# Patient Record
Sex: Male | Born: 1970 | Race: Black or African American | Hispanic: No | Marital: Single | Smoking: Current every day smoker
Health system: Southern US, Community
[De-identification: ages and names within clinical notes are randomized; demographics above are authoritative.]

## PROBLEM LIST (undated history)

## (undated) DIAGNOSIS — E7800 Pure hypercholesterolemia, unspecified: Secondary | ICD-10-CM

## (undated) DIAGNOSIS — Z872 Personal history of diseases of the skin and subcutaneous tissue: Secondary | ICD-10-CM

## (undated) DIAGNOSIS — K219 Gastro-esophageal reflux disease without esophagitis: Secondary | ICD-10-CM

## (undated) DIAGNOSIS — Z720 Tobacco use: Secondary | ICD-10-CM

## (undated) DIAGNOSIS — L0390 Cellulitis, unspecified: Secondary | ICD-10-CM

## (undated) DIAGNOSIS — M109 Gout, unspecified: Secondary | ICD-10-CM

## (undated) DIAGNOSIS — J189 Pneumonia, unspecified organism: Secondary | ICD-10-CM

## (undated) DIAGNOSIS — I10 Essential (primary) hypertension: Secondary | ICD-10-CM

## (undated) DIAGNOSIS — E119 Type 2 diabetes mellitus without complications: Secondary | ICD-10-CM

## (undated) DIAGNOSIS — R3129 Other microscopic hematuria: Secondary | ICD-10-CM

## (undated) DIAGNOSIS — K5792 Diverticulitis of intestine, part unspecified, without perforation or abscess without bleeding: Secondary | ICD-10-CM

## (undated) DIAGNOSIS — N35919 Unspecified urethral stricture, male, unspecified site: Secondary | ICD-10-CM

## (undated) DIAGNOSIS — E876 Hypokalemia: Secondary | ICD-10-CM

## (undated) HISTORY — PX: WRIST ARTHROCENTESIS: SUR48

## (undated) HISTORY — PX: ARM WOUND REPAIR / CLOSURE: SUR1141

---

## 2001-10-04 ENCOUNTER — Encounter: Payer: Self-pay | Admitting: *Deleted

## 2001-10-04 ENCOUNTER — Emergency Department (HOSPITAL_COMMUNITY): Admission: EM | Admit: 2001-10-04 | Discharge: 2001-10-04 | Payer: Self-pay | Admitting: *Deleted

## 2002-02-12 ENCOUNTER — Encounter: Payer: Self-pay | Admitting: Urology

## 2002-02-12 ENCOUNTER — Ambulatory Visit (HOSPITAL_COMMUNITY): Admission: RE | Admit: 2002-02-12 | Discharge: 2002-02-12 | Payer: Self-pay | Admitting: Urology

## 2002-08-01 ENCOUNTER — Emergency Department (HOSPITAL_COMMUNITY): Admission: EM | Admit: 2002-08-01 | Discharge: 2002-08-01 | Payer: Self-pay | Admitting: Emergency Medicine

## 2002-10-25 ENCOUNTER — Emergency Department (HOSPITAL_COMMUNITY): Admission: EM | Admit: 2002-10-25 | Discharge: 2002-10-25 | Payer: Self-pay | Admitting: *Deleted

## 2005-01-13 ENCOUNTER — Emergency Department (HOSPITAL_COMMUNITY): Admission: EM | Admit: 2005-01-13 | Discharge: 2005-01-13 | Payer: Self-pay | Admitting: Emergency Medicine

## 2005-05-07 ENCOUNTER — Emergency Department (HOSPITAL_COMMUNITY): Admission: EM | Admit: 2005-05-07 | Discharge: 2005-05-07 | Payer: Self-pay | Admitting: Emergency Medicine

## 2008-07-25 ENCOUNTER — Emergency Department (HOSPITAL_COMMUNITY): Admission: EM | Admit: 2008-07-25 | Discharge: 2008-07-25 | Payer: Self-pay | Admitting: Emergency Medicine

## 2009-01-15 ENCOUNTER — Emergency Department (HOSPITAL_COMMUNITY): Admission: EM | Admit: 2009-01-15 | Discharge: 2009-01-15 | Payer: Self-pay | Admitting: Emergency Medicine

## 2009-01-17 ENCOUNTER — Emergency Department (HOSPITAL_COMMUNITY): Admission: EM | Admit: 2009-01-17 | Discharge: 2009-01-17 | Payer: Self-pay | Admitting: Emergency Medicine

## 2009-01-22 ENCOUNTER — Emergency Department (HOSPITAL_COMMUNITY): Admission: EM | Admit: 2009-01-22 | Discharge: 2009-01-22 | Payer: Self-pay | Admitting: Emergency Medicine

## 2009-12-11 ENCOUNTER — Emergency Department (HOSPITAL_COMMUNITY): Admission: EM | Admit: 2009-12-11 | Discharge: 2009-12-11 | Payer: Self-pay | Admitting: Emergency Medicine

## 2009-12-18 ENCOUNTER — Emergency Department (HOSPITAL_COMMUNITY): Admission: EM | Admit: 2009-12-18 | Discharge: 2009-12-18 | Payer: Self-pay | Admitting: Emergency Medicine

## 2010-04-02 ENCOUNTER — Ambulatory Visit: Payer: Self-pay | Admitting: Cardiology

## 2010-04-02 ENCOUNTER — Inpatient Hospital Stay (HOSPITAL_COMMUNITY): Admission: EM | Admit: 2010-04-02 | Discharge: 2010-04-06 | Payer: Self-pay | Admitting: Emergency Medicine

## 2010-10-14 ENCOUNTER — Emergency Department (HOSPITAL_COMMUNITY): Admission: EM | Admit: 2010-10-14 | Discharge: 2010-10-14 | Payer: Self-pay | Admitting: Emergency Medicine

## 2010-10-19 ENCOUNTER — Inpatient Hospital Stay (HOSPITAL_COMMUNITY): Admission: EM | Admit: 2010-10-19 | Discharge: 2010-10-22 | Payer: Self-pay | Admitting: Emergency Medicine

## 2011-02-09 LAB — RAPID URINE DRUG SCREEN, HOSP PERFORMED
Amphetamines: NOT DETECTED
Barbiturates: NOT DETECTED
Benzodiazepines: POSITIVE — AB
Cocaine: NOT DETECTED
Opiates: POSITIVE — AB
Tetrahydrocannabinol: NOT DETECTED

## 2011-02-09 LAB — POCT I-STAT, CHEM 8
BUN: 23 mg/dL (ref 6–23)
Calcium, Ion: 1.1 mmol/L — ABNORMAL LOW (ref 1.12–1.32)
Chloride: 103 mEq/L (ref 96–112)
Creatinine, Ser: 1.3 mg/dL (ref 0.4–1.5)
Glucose, Bld: 149 mg/dL — ABNORMAL HIGH (ref 70–99)
HCT: 41 % (ref 39.0–52.0)
Hemoglobin: 13.9 g/dL (ref 13.0–17.0)
Potassium: 2.8 mEq/L — ABNORMAL LOW (ref 3.5–5.1)
Sodium: 139 meq/L (ref 135–145)
TCO2: 24 mmol/L (ref 0–100)

## 2011-02-09 LAB — URINALYSIS, ROUTINE W REFLEX MICROSCOPIC
Bilirubin Urine: NEGATIVE
Bilirubin Urine: NEGATIVE
Glucose, UA: 250 mg/dL — AB
Glucose, UA: NEGATIVE mg/dL
Ketones, ur: NEGATIVE mg/dL
Ketones, ur: NEGATIVE mg/dL
Leukocytes, UA: NEGATIVE
Leukocytes, UA: NEGATIVE
Nitrite: NEGATIVE
Nitrite: NEGATIVE
Protein, ur: NEGATIVE mg/dL
Protein, ur: NEGATIVE mg/dL
Specific Gravity, Urine: 1.02 (ref 1.005–1.030)
Specific Gravity, Urine: 1.025 (ref 1.005–1.030)
Urobilinogen, UA: 1 mg/dL (ref 0.0–1.0)
Urobilinogen, UA: >8 mg/dL — ABNORMAL HIGH (ref 0.0–1.0)
pH: 5.5 (ref 5.0–8.0)
pH: 5.5 (ref 5.0–8.0)

## 2011-02-09 LAB — BASIC METABOLIC PANEL
BUN: 22 mg/dL (ref 6–23)
BUN: 6 mg/dL (ref 6–23)
CO2: 23 meq/L (ref 19–32)
CO2: 24 meq/L (ref 19–32)
Calcium: 8.7 mg/dL (ref 8.4–10.5)
Calcium: 9 mg/dL (ref 8.4–10.5)
Chloride: 102 mEq/L (ref 96–112)
Chloride: 110 mEq/L (ref 96–112)
Creatinine, Ser: 1.05 mg/dL (ref 0.4–1.5)
Creatinine, Ser: 1.34 mg/dL (ref 0.4–1.5)
GFR calc Af Amer: >60 mL/min (ref >60)
GFR calc Af Amer: >60 mL/min (ref >60)
GFR calc non Af Amer: 60 mL/min — ABNORMAL LOW (ref >60)
GFR calc non Af Amer: >60 mL/min (ref >60)
Glucose, Bld: 127 mg/dL — ABNORMAL HIGH (ref 70–99)
Glucose, Bld: 148 mg/dL — ABNORMAL HIGH (ref 70–99)
Potassium: 2.7 meq/L — CL (ref 3.5–5.1)
Potassium: 3.8 mEq/L (ref 3.5–5.1)
Sodium: 138 meq/L (ref 135–145)
Sodium: 139 mEq/L (ref 135–145)

## 2011-02-09 LAB — URINE MICROSCOPIC-ADD ON

## 2011-02-09 LAB — GRAM STAIN

## 2011-02-09 LAB — DIFFERENTIAL
Basophils Absolute: 0 10*3/uL (ref 0.0–0.1)
Basophils Absolute: 0 10*3/uL (ref 0.0–0.1)
Basophils Absolute: 0 10*3/uL (ref 0.0–0.1)
Basophils Relative: 0 % (ref 0–1)
Basophils Relative: 0 % (ref 0–1)
Basophils Relative: 0 % (ref 0–1)
Eosinophils Absolute: 0.1 10*3/uL (ref 0.0–0.7)
Eosinophils Absolute: 0.1 10*3/uL (ref 0.0–0.7)
Eosinophils Absolute: 0.3 10*3/uL (ref 0.0–0.7)
Eosinophils Relative: 0 % (ref 0–5)
Eosinophils Relative: 1 % (ref 0–5)
Eosinophils Relative: 3 % (ref 0–5)
Lymphocytes Relative: 12 % (ref 12–46)
Lymphocytes Relative: 18 % (ref 12–46)
Lymphocytes Relative: 6 % — ABNORMAL LOW (ref 12–46)
Lymphs Abs: 1.3 10*3/uL (ref 0.7–4.0)
Lymphs Abs: 2.1 10*3/uL (ref 0.7–4.0)
Lymphs Abs: 2.3 10*3/uL (ref 0.7–4.0)
Monocytes Absolute: 0.4 10*3/uL (ref 0.1–1.0)
Monocytes Absolute: 0.5 10*3/uL (ref 0.1–1.0)
Monocytes Absolute: 0.9 10*3/uL (ref 0.1–1.0)
Monocytes Relative: 2 % — ABNORMAL LOW (ref 3–12)
Monocytes Relative: 4 % (ref 3–12)
Monocytes Relative: 5 % (ref 3–12)
Neutro Abs: 15.8 10*3/uL — ABNORMAL HIGH (ref 1.7–7.7)
Neutro Abs: 19.4 10*3/uL — ABNORMAL HIGH (ref 1.7–7.7)
Neutro Abs: 9 10*3/uL — ABNORMAL HIGH (ref 1.7–7.7)
Neutrophils Relative %: 75 % (ref 43–77)
Neutrophils Relative %: 82 % — ABNORMAL HIGH (ref 43–77)
Neutrophils Relative %: 92 % — ABNORMAL HIGH (ref 43–77)

## 2011-02-09 LAB — COMPREHENSIVE METABOLIC PANEL
ALT: 17 U/L (ref 0–53)
AST: 17 U/L (ref 0–37)
Albumin: 3.1 g/dL — ABNORMAL LOW (ref 3.5–5.2)
Alkaline Phosphatase: 46 U/L (ref 39–117)
BUN: 13 mg/dL (ref 6–23)
CO2: 23 meq/L (ref 19–32)
Calcium: 8.2 mg/dL — ABNORMAL LOW (ref 8.4–10.5)
Chloride: 105 meq/L (ref 96–112)
Creatinine, Ser: 1.11 mg/dL (ref 0.4–1.5)
GFR calc Af Amer: >60 mL/min (ref >60)
GFR calc non Af Amer: >60 mL/min (ref >60)
Glucose, Bld: 148 mg/dL — ABNORMAL HIGH (ref 70–99)
Potassium: 3.4 mEq/L — ABNORMAL LOW (ref 3.5–5.1)
Sodium: 137 meq/L (ref 135–145)
Total Bilirubin: 1.1 mg/dL (ref 0.3–1.2)
Total Protein: 5.6 g/dL — ABNORMAL LOW (ref 6.0–8.3)

## 2011-02-09 LAB — CBC
HCT: 33.5 % — ABNORMAL LOW (ref 39.0–52.0)
HCT: 34.4 % — ABNORMAL LOW (ref 39.0–52.0)
HCT: 38.6 % — ABNORMAL LOW (ref 39.0–52.0)
Hemoglobin: 11.6 g/dL — ABNORMAL LOW (ref 13.0–17.0)
Hemoglobin: 11.7 g/dL — ABNORMAL LOW (ref 13.0–17.0)
Hemoglobin: 12.9 g/dL — ABNORMAL LOW (ref 13.0–17.0)
MCH: 31.5 pg (ref 26.0–34.0)
MCH: 32 pg (ref 26.0–34.0)
MCH: 32.2 pg (ref 26.0–34.0)
MCHC: 33.5 g/dL (ref 30.0–36.0)
MCHC: 34 g/dL (ref 30.0–36.0)
MCHC: 34.6 g/dL (ref 30.0–36.0)
MCV: 92.3 fL (ref 78.0–100.0)
MCV: 92.7 fL (ref 78.0–100.0)
MCV: 96.1 fL (ref 78.0–100.0)
Platelets: 194 10*3/uL (ref 150–400)
Platelets: 203 10*3/uL (ref 150–400)
Platelets: 226 10*3/uL (ref 150–400)
RBC: 3.63 MIL/uL — ABNORMAL LOW (ref 4.22–5.81)
RBC: 3.71 MIL/uL — ABNORMAL LOW (ref 4.22–5.81)
RBC: 4.02 MIL/uL — ABNORMAL LOW (ref 4.22–5.81)
RDW: 13.9 % (ref 11.5–15.5)
RDW: 13.9 % (ref 11.5–15.5)
RDW: 14.5 % (ref 11.5–15.5)
WBC: 11.9 10*3/uL — ABNORMAL HIGH (ref 4.0–10.5)
WBC: 19.1 10*3/uL — ABNORMAL HIGH (ref 4.0–10.5)
WBC: 21.2 10*3/uL — ABNORMAL HIGH (ref 4.0–10.5)

## 2011-02-09 LAB — CULTURE, BLOOD (ROUTINE X 2)
Culture  Setup Time: 201111222100
Culture: NO GROWTH
Culture: NO GROWTH
Culture: NO GROWTH

## 2011-02-09 LAB — CULTURE, RESPIRATORY W GRAM STAIN: Culture: NORMAL

## 2011-02-09 LAB — CARDIAC PANEL(CRET KIN+CKTOT+MB+TROPI)
CK, MB: 1.8 ng/mL (ref 0.3–4.0)
CK, MB: 2.4 ng/mL (ref 0.3–4.0)
CK, MB: 2.7 ng/mL (ref 0.3–4.0)
Relative Index: 0.6 (ref 0.0–2.5)
Relative Index: 0.6 (ref 0.0–2.5)
Relative Index: 0.7 (ref 0.0–2.5)
Total CK: 278 U/L — ABNORMAL HIGH (ref 7–232)
Total CK: 382 U/L — ABNORMAL HIGH (ref 7–232)
Total CK: 412 U/L — ABNORMAL HIGH (ref 7–232)
Troponin I: 0.01 ng/mL (ref 0.00–0.06)
Troponin I: 0.04 ng/mL (ref 0.00–0.06)
Troponin I: <0.01 ng/mL (ref 0.00–0.06)

## 2011-02-09 LAB — PHOSPHORUS
Phosphorus: 2.2 mg/dL — ABNORMAL LOW (ref 2.3–4.6)
Phosphorus: 3 mg/dL (ref 2.3–4.6)

## 2011-02-09 LAB — MAGNESIUM
Magnesium: 1.8 mg/dL (ref 1.5–2.5)
Magnesium: 1.9 mg/dL (ref 1.5–2.5)

## 2011-02-09 LAB — URINE CULTURE
Colony Count: NO GROWTH
Culture  Setup Time: 201111220320
Culture: NO GROWTH
Special Requests: NEGATIVE

## 2011-02-09 LAB — VANCOMYCIN, TROUGH
Vancomycin Tr: 11.2 ug/mL (ref 10.0–20.0)
Vancomycin Tr: 19.1 ug/mL (ref 10.0–20.0)

## 2011-02-09 LAB — HEMOGLOBIN A1C
Hgb A1c MFr Bld: 5.5 % (ref <5.7)
Mean Plasma Glucose: 111 mg/dL (ref <117)

## 2011-02-09 LAB — PROTIME-INR
INR: 1.12 (ref 0.00–1.49)
Prothrombin Time: 14.6 seconds (ref 11.6–15.2)

## 2011-02-09 LAB — APTT: aPTT: 31 s (ref 24–37)

## 2011-02-09 LAB — T4, FREE: Free T4: 1.12 ng/dL (ref 0.80–1.80)

## 2011-02-09 LAB — LACTIC ACID, PLASMA: Lactic Acid, Venous: 1.4 mmol/L (ref 0.5–2.2)

## 2011-02-09 LAB — TSH: TSH: 0.999 u[IU]/mL (ref 0.350–4.500)

## 2011-02-09 LAB — PROCALCITONIN: Procalcitonin: 2.99 ng/mL

## 2011-02-14 LAB — DIFFERENTIAL
Basophils Absolute: 0.1 10*3/uL (ref 0.0–0.1)
Basophils Relative: 1 % (ref 0–1)
Eosinophils Absolute: 0.2 10*3/uL (ref 0.0–0.7)
Eosinophils Relative: 2 % (ref 0–5)
Lymphocytes Relative: 17 % (ref 12–46)
Lymphs Abs: 1.4 10*3/uL (ref 0.7–4.0)
Monocytes Absolute: 0.2 10*3/uL (ref 0.1–1.0)
Monocytes Relative: 3 % (ref 3–12)
Neutro Abs: 6.6 10*3/uL (ref 1.7–7.7)
Neutrophils Relative %: 78 % — ABNORMAL HIGH (ref 43–77)

## 2011-02-14 LAB — URINALYSIS, ROUTINE W REFLEX MICROSCOPIC
Bilirubin Urine: NEGATIVE
Glucose, UA: NEGATIVE mg/dL
Ketones, ur: NEGATIVE mg/dL
Nitrite: NEGATIVE
Protein, ur: NEGATIVE mg/dL
Specific Gravity, Urine: 1.02 (ref 1.005–1.030)
Urobilinogen, UA: 0.2 mg/dL (ref 0.0–1.0)
pH: 5.5 (ref 5.0–8.0)

## 2011-02-14 LAB — BASIC METABOLIC PANEL
BUN: 8 mg/dL (ref 6–23)
CO2: 25 mEq/L (ref 19–32)
Calcium: 9.6 mg/dL (ref 8.4–10.5)
Chloride: 105 mEq/L (ref 96–112)
Creatinine, Ser: 1.29 mg/dL (ref 0.4–1.5)
GFR calc Af Amer: >60 mL/min (ref >60)
GFR calc non Af Amer: >60 mL/min (ref >60)
Glucose, Bld: 104 mg/dL — ABNORMAL HIGH (ref 70–99)
Potassium: 4 meq/L (ref 3.5–5.1)
Sodium: 137 mEq/L (ref 135–145)

## 2011-02-14 LAB — URINE MICROSCOPIC-ADD ON

## 2011-02-14 LAB — CBC
HCT: 42.5 % (ref 39.0–52.0)
Hemoglobin: 14.1 g/dL (ref 13.0–17.0)
MCHC: 33.3 g/dL (ref 30.0–36.0)
MCV: 94.6 fL (ref 78.0–100.0)
Platelets: 230 10*3/uL (ref 150–400)
RBC: 4.49 MIL/uL (ref 4.22–5.81)
RDW: 13.8 % (ref 11.5–15.5)
WBC: 8.4 10*3/uL (ref 4.0–10.5)

## 2011-02-16 LAB — CULTURE, BLOOD (ROUTINE X 2)
Culture: NO GROWTH
Culture: NO GROWTH
Report Status: 5102011
Report Status: 5102011

## 2011-02-16 LAB — DIFFERENTIAL
Basophils Absolute: 0 10*3/uL (ref 0.0–0.1)
Basophils Absolute: 0 10*3/uL (ref 0.0–0.1)
Basophils Absolute: 0 10*3/uL (ref 0.0–0.1)
Basophils Relative: 0 % (ref 0–1)
Basophils Relative: 0 % (ref 0–1)
Basophils Relative: 0 % (ref 0–1)
Eosinophils Absolute: 0 10*3/uL (ref 0.0–0.7)
Eosinophils Absolute: 0.1 10*3/uL (ref 0.0–0.7)
Eosinophils Absolute: 0.4 10*3/uL (ref 0.0–0.7)
Eosinophils Relative: 0 % (ref 0–5)
Eosinophils Relative: 1 % (ref 0–5)
Eosinophils Relative: 6 % — ABNORMAL HIGH (ref 0–5)
Lymphocytes Relative: 16 % (ref 12–46)
Lymphocytes Relative: 17 % (ref 12–46)
Lymphocytes Relative: 6 % — ABNORMAL LOW (ref 12–46)
Lymphs Abs: 1 10*3/uL (ref 0.7–4.0)
Lymphs Abs: 1.1 10*3/uL (ref 0.7–4.0)
Lymphs Abs: 1.2 10*3/uL (ref 0.7–4.0)
Monocytes Absolute: 0.3 10*3/uL (ref 0.1–1.0)
Monocytes Absolute: 0.3 10*3/uL (ref 0.1–1.0)
Monocytes Absolute: 0.4 10*3/uL (ref 0.1–1.0)
Monocytes Relative: 2 % — ABNORMAL LOW (ref 3–12)
Monocytes Relative: 5 % (ref 3–12)
Monocytes Relative: 7 % (ref 3–12)
Neutro Abs: 17.8 10*3/uL — ABNORMAL HIGH (ref 1.7–7.7)
Neutro Abs: 4.5 10*3/uL (ref 1.7–7.7)
Neutro Abs: 4.7 10*3/uL (ref 1.7–7.7)
Neutrophils Relative %: 73 % (ref 43–77)
Neutrophils Relative %: 75 % (ref 43–77)
Neutrophils Relative %: 92 % — ABNORMAL HIGH (ref 43–77)

## 2011-02-16 LAB — CBC
HCT: 32.4 % — ABNORMAL LOW (ref 39.0–52.0)
HCT: 33.8 % — ABNORMAL LOW (ref 39.0–52.0)
HCT: 39.8 % (ref 39.0–52.0)
Hemoglobin: 11.5 g/dL — ABNORMAL LOW (ref 13.0–17.0)
Hemoglobin: 11.9 g/dL — ABNORMAL LOW (ref 13.0–17.0)
Hemoglobin: 13.8 g/dL (ref 13.0–17.0)
MCHC: 34.8 g/dL (ref 30.0–36.0)
MCHC: 35.2 g/dL (ref 30.0–36.0)
MCHC: 35.5 g/dL (ref 30.0–36.0)
MCV: 92.8 fL (ref 78.0–100.0)
MCV: 93.1 fL (ref 78.0–100.0)
MCV: 93.1 fL (ref 78.0–100.0)
Platelets: 173 10*3/uL (ref 150–400)
Platelets: 179 10*3/uL (ref 150–400)
Platelets: 220 10*3/uL (ref 150–400)
RBC: 3.49 MIL/uL — ABNORMAL LOW (ref 4.22–5.81)
RBC: 3.63 MIL/uL — ABNORMAL LOW (ref 4.22–5.81)
RBC: 4.27 MIL/uL (ref 4.22–5.81)
RDW: 13.7 % (ref 11.5–15.5)
RDW: 13.8 % (ref 11.5–15.5)
RDW: 14.1 % (ref 11.5–15.5)
WBC: 19.4 10*3/uL — ABNORMAL HIGH (ref 4.0–10.5)
WBC: 6.1 10*3/uL (ref 4.0–10.5)
WBC: 6.3 10*3/uL (ref 4.0–10.5)

## 2011-02-16 LAB — URINALYSIS, ROUTINE W REFLEX MICROSCOPIC
Bilirubin Urine: NEGATIVE
Glucose, UA: NEGATIVE mg/dL
Ketones, ur: NEGATIVE mg/dL
Leukocytes, UA: NEGATIVE
Nitrite: NEGATIVE
Protein, ur: NEGATIVE mg/dL
Specific Gravity, Urine: 1.01 (ref 1.005–1.030)
Urobilinogen, UA: 0.2 mg/dL (ref 0.0–1.0)
pH: 7 (ref 5.0–8.0)

## 2011-02-16 LAB — LIPID PANEL
Cholesterol: 187 mg/dL (ref 0–200)
HDL: 35 mg/dL — ABNORMAL LOW (ref >39)
LDL Cholesterol: 136 mg/dL — ABNORMAL HIGH (ref 0–99)
Total CHOL/HDL Ratio: 5.3 RATIO
Triglycerides: 82 mg/dL (ref <150)
VLDL: 16 mg/dL (ref 0–40)

## 2011-02-16 LAB — BASIC METABOLIC PANEL
BUN: 10 mg/dL (ref 6–23)
BUN: 11 mg/dL (ref 6–23)
BUN: 8 mg/dL (ref 6–23)
BUN: 9 mg/dL (ref 6–23)
CO2: 23 mEq/L (ref 19–32)
CO2: 24 mEq/L (ref 19–32)
CO2: 25 mEq/L (ref 19–32)
CO2: 26 mEq/L (ref 19–32)
Calcium: 8.3 mg/dL — ABNORMAL LOW (ref 8.4–10.5)
Calcium: 8.3 mg/dL — ABNORMAL LOW (ref 8.4–10.5)
Calcium: 9 mg/dL (ref 8.4–10.5)
Calcium: 9.5 mg/dL (ref 8.4–10.5)
Chloride: 106 mEq/L (ref 96–112)
Chloride: 106 meq/L (ref 96–112)
Chloride: 107 mEq/L (ref 96–112)
Chloride: 98 mEq/L (ref 96–112)
Creatinine, Ser: 0.85 mg/dL (ref 0.4–1.5)
Creatinine, Ser: 1.11 mg/dL (ref 0.4–1.5)
Creatinine, Ser: 1.2 mg/dL (ref 0.4–1.5)
Creatinine, Ser: 1.24 mg/dL (ref 0.4–1.5)
GFR calc Af Amer: >60 mL/min (ref >60)
GFR calc Af Amer: >60 mL/min (ref >60)
GFR calc Af Amer: >60 mL/min (ref >60)
GFR calc Af Amer: >60 mL/min (ref >60)
GFR calc non Af Amer: >60 mL/min (ref >60)
GFR calc non Af Amer: >60 mL/min (ref >60)
GFR calc non Af Amer: >60 mL/min (ref >60)
GFR calc non Af Amer: >60 mL/min (ref >60)
Glucose, Bld: 102 mg/dL — ABNORMAL HIGH (ref 70–99)
Glucose, Bld: 102 mg/dL — ABNORMAL HIGH (ref 70–99)
Glucose, Bld: 104 mg/dL — ABNORMAL HIGH (ref 70–99)
Glucose, Bld: 122 mg/dL — ABNORMAL HIGH (ref 70–99)
Potassium: 3.4 meq/L — ABNORMAL LOW (ref 3.5–5.1)
Potassium: 3.5 mEq/L (ref 3.5–5.1)
Potassium: 3.8 mEq/L (ref 3.5–5.1)
Potassium: 3.8 meq/L (ref 3.5–5.1)
Sodium: 134 mEq/L — ABNORMAL LOW (ref 135–145)
Sodium: 135 meq/L (ref 135–145)
Sodium: 137 mEq/L (ref 135–145)
Sodium: 139 mEq/L (ref 135–145)

## 2011-02-16 LAB — CARDIAC PANEL(CRET KIN+CKTOT+MB+TROPI)
CK, MB: 0.8 ng/mL (ref 0.3–4.0)
CK, MB: 1.7 ng/mL (ref 0.3–4.0)
Relative Index: 0.3 (ref 0.0–2.5)
Relative Index: 0.7 (ref 0.0–2.5)
Total CK: 229 U/L (ref 7–232)
Total CK: 231 U/L (ref 7–232)
Troponin I: 0.02 ng/mL (ref 0.00–0.06)
Troponin I: 0.03 ng/mL (ref 0.00–0.06)

## 2011-02-16 LAB — URINE MICROSCOPIC-ADD ON

## 2011-02-16 LAB — POCT CARDIAC MARKERS
CKMB, poc: <1 ng/mL — ABNORMAL LOW (ref 1.0–8.0)
CKMB, poc: <1 ng/mL — ABNORMAL LOW (ref 1.0–8.0)
Myoglobin, poc: 54.3 ng/mL (ref 12–200)
Myoglobin, poc: 96 ng/mL (ref 12–200)
Troponin i, poc: <0.05 ng/mL (ref 0.00–0.09)
Troponin i, poc: <0.05 ng/mL (ref 0.00–0.09)

## 2011-02-16 LAB — TSH: TSH: 0.29 u[IU]/mL — ABNORMAL LOW (ref 0.350–4.500)

## 2011-02-16 LAB — RAPID URINE DRUG SCREEN, HOSP PERFORMED
Amphetamines: NOT DETECTED
Barbiturates: NOT DETECTED
Benzodiazepines: POSITIVE — AB
Cocaine: NOT DETECTED
Opiates: NOT DETECTED
Tetrahydrocannabinol: NOT DETECTED

## 2011-02-16 LAB — BRAIN NATRIURETIC PEPTIDE: Pro B Natriuretic peptide (BNP): <30 pg/mL (ref 0.0–100.0)

## 2011-02-16 LAB — GLUCOSE, CAPILLARY: Glucose-Capillary: 93 mg/dL (ref 70–99)

## 2011-02-16 LAB — PROTIME-INR
INR: 1.18 (ref 0.00–1.49)
Prothrombin Time: 15.2 seconds (ref 11.6–15.2)

## 2011-02-16 LAB — VANCOMYCIN, TROUGH: Vancomycin Tr: 12 ug/mL (ref 10.0–20.0)

## 2011-02-16 LAB — T4, FREE: Free T4: 0.72 ng/dL — ABNORMAL LOW (ref 0.80–1.80)

## 2011-03-16 LAB — POCT CARDIAC MARKERS
CKMB, poc: 1.4 ng/mL (ref 1.0–8.0)
CKMB, poc: 1.9 ng/mL (ref 1.0–8.0)
Myoglobin, poc: 53.5 ng/mL (ref 12–200)
Myoglobin, poc: 80.6 ng/mL (ref 12–200)
Troponin i, poc: <0.05 ng/mL (ref 0.00–0.09)
Troponin i, poc: <0.05 ng/mL (ref 0.00–0.09)

## 2011-03-16 LAB — POCT I-STAT, CHEM 8
BUN: 16 mg/dL (ref 6–23)
Calcium, Ion: 1.17 mmol/L (ref 1.12–1.32)
Chloride: 103 mEq/L (ref 96–112)
Creatinine, Ser: 1.1 mg/dL (ref 0.4–1.5)
Glucose, Bld: 90 mg/dL (ref 70–99)
HCT: 46 % (ref 39.0–52.0)
Hemoglobin: 15.6 g/dL (ref 13.0–17.0)
Potassium: 3.8 mEq/L (ref 3.5–5.1)
Sodium: 139 mEq/L (ref 135–145)
TCO2: 27 mmol/L (ref 0–100)

## 2011-03-16 LAB — BASIC METABOLIC PANEL
BUN: 12 mg/dL (ref 6–23)
CO2: 28 meq/L (ref 19–32)
Calcium: 9.8 mg/dL (ref 8.4–10.5)
Chloride: 103 meq/L (ref 96–112)
Creatinine, Ser: 1.11 mg/dL (ref 0.4–1.5)
GFR calc Af Amer: >60 mL/min (ref >60)
GFR calc non Af Amer: >60 mL/min (ref >60)
Glucose, Bld: 88 mg/dL (ref 70–99)
Potassium: 3.4 mEq/L — ABNORMAL LOW (ref 3.5–5.1)
Sodium: 139 meq/L (ref 135–145)

## 2011-04-17 ENCOUNTER — Emergency Department (HOSPITAL_COMMUNITY)
Admission: EM | Admit: 2011-04-17 | Discharge: 2011-04-17 | Disposition: A | Discharge status: Home / Self Care | Payer: Self-pay | Attending: Emergency Medicine | Admitting: Emergency Medicine

## 2011-04-17 DIAGNOSIS — F10229 Alcohol dependence with intoxication, unspecified: Secondary | ICD-10-CM | POA: Insufficient documentation

## 2011-07-14 ENCOUNTER — Emergency Department (HOSPITAL_COMMUNITY)
Admission: EM | Admit: 2011-07-14 | Discharge: 2011-07-14 | Disposition: A | Discharge status: Home / Self Care | Payer: Self-pay | Attending: Emergency Medicine | Admitting: Emergency Medicine

## 2011-07-14 ENCOUNTER — Encounter: Payer: Self-pay | Admitting: *Deleted

## 2011-07-14 DIAGNOSIS — J45909 Unspecified asthma, uncomplicated: Secondary | ICD-10-CM | POA: Insufficient documentation

## 2011-07-14 DIAGNOSIS — I10 Essential (primary) hypertension: Secondary | ICD-10-CM | POA: Diagnosis present

## 2011-07-14 DIAGNOSIS — F17200 Nicotine dependence, unspecified, uncomplicated: Secondary | ICD-10-CM | POA: Insufficient documentation

## 2011-07-14 HISTORY — DX: Essential (primary) hypertension: I10

## 2011-07-14 HISTORY — DX: Hypokalemia: E87.6

## 2011-07-14 MED ORDER — LISINOPRIL-HYDROCHLOROTHIAZIDE 20-12.5 MG PO TABS: 1.0000 | ORAL_TABLET | Freq: Every day | ORAL | Status: DC

## 2011-07-14 MED ORDER — POTASSIUM CHLORIDE ER 10 MEQ PO TBCR: 10.0000 meq | EXTENDED_RELEASE_TABLET | Freq: Two times a day (BID) | ORAL | Status: DC

## 2011-07-14 NOTE — Discharge Instructions (Signed)
Hypertension Information  As your heart beats, it forces blood through your arteries. This force is your blood pressure. If the pressure is too high, it is called hypertension (HTN) or high blood pressure. HTN is dangerous because you may have it and not know it. High blood pressure may mean that your heart has to work harder to pump blood. Your arteries may be narrow or stiff. The extra work puts you at risk for heart disease, stroke, and other problems.    Blood pressure consists of two numbers, a higher number over a lower, 110/72, for example. It is stated as "110 over 72." The ideal is below 120 for the top number (systolic) and under 80 for the bottom (diastolic).    You should pay close attention to your blood pressure if you have certain conditions such as:   Heart failure.    Prior heart attack.      Diabetes    Chronic kidney disease.      Prior stroke.      Multiple risk factors for heart disease.      To see if you have HTN, your blood pressure should be measured while you are seated with your arm held at the level of the heart. It should be measured at least twice. A one-time elevated blood pressure reading (especially in the Emergency Department) does not mean that you need treatment. There may be conditions in which the blood pressure is different between your right and left arms. It is important to see your caregiver soon for a recheck.  Most people have essential hypertension which means that there is not a specific cause. This type of high blood pressure may be lowered by changing lifestyle factors such as:   Stress.    Smoking.     Lack of exercise.     Excessive weight.    Drug/tobacco/alcohol use.      Eating less salt.      Most people do not have symptoms from high blood pressure until it has caused damage to the body. Effective treatment can often prevent, delay or reduce that damage.  TREATMENT   Treatment for high blood pressure, when a cause has been identified, is directed at the cause. There are a large number of medications to treat HTN. These fall into several categories, and your caregiver will help you select the medicines that are best for you. Medications may have side effects. You should review side effects with your caregiver.  If your blood pressure stays high after you have made lifestyle changes or started on medicines,     Your medication(s) may need to be changed.    Other problems may need to be addressed.    Be certain you understand your prescriptions, and know how and when to take your medicine.    Be sure to follow up with your caregiver within the time frame advised (usually within two weeks) to have your blood pressure rechecked and to review your medications.    If you are taking more than one medicine to lower your blood pressure, make sure you know how and at what times they should be taken. Taking two medicines at the same time can result in blood pressure that is too low.   Document Released: 01/18/2006 Document Re-Released: 02/09/2010  [employer] Patient Information 2011 [employer].

## 2011-07-14 NOTE — ED Notes (Signed)
States he has been out of town working and has been out of meds for he past 4 months, states he [facility] the free [facility] today and was unable to be seen, bp elevated  At [employer]

## 2011-07-14 NOTE — ED Provider Notes (Signed)
History     CSN: [id number] Arrival date & time: 07/14/2011  1:28 PM  Chief Complaint  Patient presents with  . Hypertension   HPI Pt reports he has been out of his blood pressure medications for the last few weeks while he was working out of town. He was unable to get in to see his PCP at the free clinic today and came to the ED for eval. Denies any symptoms. No headache, blurry vision or chest pain.   Past Medical History  Diagnosis Date  . Hypertension   . Asthma   . Hypokalemia     Past Surgical History  Procedure Date  . Arm wound repair / closure     History reviewed. No pertinent family history.  History  Substance Use Topics  . Smoking status: Current Everyday Smoker -- 0.5 packs/day    Types: Cigarettes  . Smokeless tobacco: Not on file  . Alcohol Use: No      Review of Systems All other systems reviewed and are negative except as noted in HPI.   Physical Exam  BP 160/104  Pulse 83  Temp(Src) 98.4 F (36.9 C) (Oral)  Resp 18  Ht 6\' 3"  (1.905 m)  Wt 300 lb (136.079 kg)  BMI 37.50 kg/m2  SpO2 100% HTN Physical Exam  Nursing note and vitals reviewed. Constitutional: He is oriented to person, place, and time. He appears well-developed and well-nourished.  HENT:  Head: Normocephalic and atraumatic.  Eyes: EOM are normal. Pupils are equal, round, and reactive to light.  Neck: Normal range of motion. Neck supple.  Cardiovascular: Normal rate, normal heart sounds and intact distal pulses.   Pulmonary/Chest: Effort normal and breath sounds normal.  Abdominal: Bowel sounds are normal. He exhibits no distension. There is no tenderness.  Musculoskeletal: Normal range of motion. He exhibits no edema and no tenderness.  Neurological: He is alert and oriented to person, place, and time. No cranial nerve deficit.  Skin: Skin is warm and dry. No rash noted.  Psychiatric: He has a normal mood and affect.    ED Course  Procedures  MDM Pt with asymptomatic  HTN out of his meds. He cannot afford his previously prescribed quinipril/HCTZ, but can afford lisinopril/HCTZ. Will d/c with a new Rx and he will followup with PCP within the month.       Charles B. Bernette Mayers, MD 07/14/11 1450

## 2013-01-30 ENCOUNTER — Encounter (HOSPITAL_COMMUNITY): Payer: Self-pay | Admitting: *Deleted

## 2013-01-30 ENCOUNTER — Emergency Department (HOSPITAL_COMMUNITY)
Admission: EM | Admit: 2013-01-30 | Discharge: 2013-01-30 | Disposition: A | Discharge status: Home / Self Care | Payer: Medicaid Other | Attending: Emergency Medicine | Admitting: Emergency Medicine

## 2013-01-30 ENCOUNTER — Emergency Department (HOSPITAL_COMMUNITY): Payer: Medicaid Other

## 2013-01-30 DIAGNOSIS — R109 Unspecified abdominal pain: Secondary | ICD-10-CM | POA: Insufficient documentation

## 2013-01-30 DIAGNOSIS — F17200 Nicotine dependence, unspecified, uncomplicated: Secondary | ICD-10-CM | POA: Insufficient documentation

## 2013-01-30 DIAGNOSIS — M79671 Pain in right foot: Secondary | ICD-10-CM

## 2013-01-30 DIAGNOSIS — Z862 Personal history of diseases of the blood and blood-forming organs and certain disorders involving the immune mechanism: Secondary | ICD-10-CM | POA: Insufficient documentation

## 2013-01-30 DIAGNOSIS — M7989 Other specified soft tissue disorders: Secondary | ICD-10-CM | POA: Insufficient documentation

## 2013-01-30 DIAGNOSIS — I10 Essential (primary) hypertension: Secondary | ICD-10-CM | POA: Insufficient documentation

## 2013-01-30 DIAGNOSIS — Z79899 Other long term (current) drug therapy: Secondary | ICD-10-CM | POA: Insufficient documentation

## 2013-01-30 DIAGNOSIS — Z8639 Personal history of other endocrine, nutritional and metabolic disease: Secondary | ICD-10-CM | POA: Insufficient documentation

## 2013-01-30 DIAGNOSIS — Z8719 Personal history of other diseases of the digestive system: Secondary | ICD-10-CM | POA: Insufficient documentation

## 2013-01-30 DIAGNOSIS — J45901 Unspecified asthma with (acute) exacerbation: Secondary | ICD-10-CM | POA: Insufficient documentation

## 2013-01-30 DIAGNOSIS — M1990 Unspecified osteoarthritis, unspecified site: Secondary | ICD-10-CM | POA: Insufficient documentation

## 2013-01-30 DIAGNOSIS — M79609 Pain in unspecified limb: Secondary | ICD-10-CM | POA: Insufficient documentation

## 2013-01-30 HISTORY — DX: Diverticulitis of intestine, part unspecified, without perforation or abscess without bleeding: K57.92

## 2013-01-30 MED ORDER — HYDROCODONE-ACETAMINOPHEN 5-325 MG PO TABS
2.0000 | ORAL_TABLET | Freq: Once | ORAL | Status: AC
  Administered 2013-01-30: 2 via ORAL
  Filled 2013-01-30: qty 2

## 2013-01-30 MED ORDER — PREDNISONE 50 MG PO TABS
60.0000 mg | ORAL_TABLET | Freq: Once | ORAL | Status: AC
  Administered 2013-01-30: 60 mg via ORAL
  Filled 2013-01-30: qty 1

## 2013-01-30 MED ORDER — DEXAMETHASONE 6 MG PO TABS: ORAL_TABLET | ORAL | Status: DC

## 2013-01-30 MED ORDER — KETOROLAC TROMETHAMINE 10 MG PO TABS
10.0000 mg | ORAL_TABLET | Freq: Once | ORAL | Status: AC
  Administered 2013-01-30: 10 mg via ORAL
  Filled 2013-01-30: qty 1

## 2013-01-30 MED ORDER — DICLOFENAC SODIUM 75 MG PO TBEC: 75.0000 mg | DELAYED_RELEASE_TABLET | Freq: Two times a day (BID) | ORAL | Status: DC

## 2013-01-30 MED ORDER — HYDROCODONE-ACETAMINOPHEN 5-325 MG PO TABS: ORAL_TABLET | ORAL | Status: DC

## 2013-01-30 NOTE — ED Provider Notes (Signed)
Medical screening examination/treatment/procedure(s) were performed by non-physician practitioner and as supervising physician I was immediately available for consultation/collaboration.   Joya Gaskins, MD 01/30/13 2226

## 2013-01-30 NOTE — ED Provider Notes (Signed)
History     CSN: [id number]  Arrival date & time 01/30/13  1650   First MD Initiated Contact with Patient 01/30/13 1713      Chief Complaint  Patient presents with  . Foot Swelling    (Consider location/radiation/quality/duration/timing/severity/associated sxs/prior treatment) HPI Comments: Patient presents to the emergency department with complaint of right foot pain. The patient states that on March 1 he was installing a bathroom floor. On the following morning he noted swelling of the right foot particularly the right first toe. The patient does not recall an injury to the toe. He does not recall a puncture wound of any kind to the foot. It is of note that the patient has had a history of" gout" in the past. The patient states he's not sure if this is a similar problem on the opposite side or not. His been no previous operations or procedures on the right foot. Patient presents for evaluation and management of this problem.  The history is provided by the patient.    Past Medical History  Diagnosis Date  . Hypertension   . Asthma   . Hypokalemia   . Diverticulitis     Past Surgical History  Procedure Laterality Date  . Arm wound repair / closure      History reviewed. No pertinent family history.  History  Substance Use Topics  . Smoking status: Current Every Day Smoker -- 0.50 packs/day    Types: Cigarettes  . Smokeless tobacco: Not on file  . Alcohol Use: No      Review of Systems  Constitutional: Negative for activity change.       All ROS Neg except as noted in HPI  HENT: Negative for nosebleeds and neck pain.   Eyes: Negative for photophobia and discharge.  Respiratory: Positive for wheezing. Negative for cough and shortness of breath.   Cardiovascular: Negative for chest pain and palpitations.  Gastrointestinal: Positive for abdominal pain. Negative for blood in stool.  Genitourinary: Negative for dysuria, frequency and hematuria.  Musculoskeletal:  Positive for arthralgias. Negative for back pain.  Skin: Negative.   Neurological: Negative for dizziness, seizures and speech difficulty.  Psychiatric/Behavioral: Negative for hallucinations and confusion.    Allergies  Penicillins  Home Medications   Current Outpatient Rx  Name  Route  Sig  Dispense  Refill  . ALPRAZolam (XANAX) 0.5 MG tablet   Oral   Take 0.5 mg by mouth 3 (three) times daily as needed. anxiety          . EXPIRED: lisinopril-hydrochlorothiazide (PRINZIDE) 20-12.5 MG per tablet   Oral   Take 1 tablet by mouth daily.   30 tablet   2   . EXPIRED: potassium chloride (K-DUR) 10 MEQ tablet   Oral   Take 1 tablet (10 mEq total) by mouth 2 (two) times daily.   60 tablet   2   . potassium chloride (MICRO-K) 10 MEQ CR capsule   Oral   Take 10 mEq by mouth 2 (two) times daily.           . quinapril-hydrochlorothiazide (ACCURETIC) 20-12.5 MG per tablet   Oral   Take 1 tablet by mouth daily.             BP 141/97  Pulse 88  Temp(Src) 97.6 F (36.4 C) (Oral)  Resp 20  Ht 6\' 3"  (1.905 m)  Wt 317 lb (143.79 kg)  BMI 39.62 kg/m2  Physical Exam  Nursing note and vitals reviewed. Constitutional: He is  oriented to person, place, and time. He appears well-developed and well-nourished.  Non-toxic appearance.  HENT:  Head: Normocephalic.  Right Ear: Tympanic membrane and external ear normal.  Left Ear: Tympanic membrane and external ear normal.  Eyes: EOM and lids are normal. Pupils are equal, round, and reactive to light.  Neck: Normal range of motion. Neck supple. Carotid bruit is not present.  Cardiovascular: Normal rate, regular rhythm, normal heart sounds, intact distal pulses and normal pulses.   Pulmonary/Chest: Breath sounds normal. No respiratory distress.  Abdominal: Soft. Bowel sounds are normal. There is no tenderness. There is no guarding.  Musculoskeletal: Normal range of motion.  Pain of the lateral-plantar surface of the right 1st toe.   No lesions between the toes . DP pulse 2+. No visible puncture of the plantar surface.  Lymphadenopathy:       Head (right side): No submandibular adenopathy present.       Head (left side): No submandibular adenopathy present.    He has no cervical adenopathy.  Neurological: He is alert and oriented to person, place, and time. He has normal strength. No cranial nerve deficit or sensory deficit.  Skin: Skin is warm and dry.  Psychiatric: He has a normal mood and affect. His speech is normal.    ED Course  Procedures (including critical care time)  Labs Reviewed - No data to display No results found.   No diagnosis found.    MDM  I have reviewed nursing notes, vital signs, and all appropriate lab and imaging results for this patient. Patient presents to the emergency department with complaint of pain of the right first toe and the lateral portion of the  Forefoot. X-ray of the foot is negative for fracture, dislocation, foreign body, or gas. Suspect that the patient has an anti-inflammatory attack involving the right foot. The plan at this time is for the patient to be treated with Decadron 6 mg daily, and he diclofenac 2 times daily, Norco every 4 hours as needed for pain. Patient is advised to see his primary care physician or return to the emergency apartment if any changes, problems, or concerns.       Kathie Dike, PA-C 01/30/13 2155

## 2013-01-30 NOTE — Discharge Instructions (Signed)
Arthritis, Nonspecific  Arthritis is pain, redness, warmth, or puffiness (inflammation) of a joint. The joint may be stiff or hurt when you move it. One or more joints may be affected. There are many types of arthritis. Your doctor may not know what type you have right away. The most common cause of arthritis is wear and tear on the joint (osteoarthritis).  HOME CARE    Only take medicine as told by your doctor.   Rest the joint as much as possible.   Raise (elevate) your joint if it is puffy.   Use crutches if the painful joint is in your leg.   Drink enough fluids to keep your pee (urine) clear or pale yellow.   Follow your doctor's diet instructions.   Use cold packs for very bad joint pain for 10 to 15 minutes every hour. Ask your doctor if it is okay for you to use hot packs.   Exercise as told by your doctor.   Take a warm shower if you have stiffness in the morning.   Move your sore joints throughout the day.  GET HELP RIGHT AWAY IF:    You have a fever.   You have very bad joint pain, puffiness, or redness.   You have many joints that are painful and puffy.   You are not getting better with treatment.   You have very bad back pain or leg weakness.   You cannot control when you poop (bowel movement) or pee (urinate).   You do not feel better in 24 hours or are getting worse.   You are having side effects from your medicine.  MAKE SURE YOU:    Understand these instructions.   Will watch your condition.   Will get help right away if you are not doing well or get worse.  Document Released: 02/09/2010 Document Revised: 05/16/2012 Document Reviewed: 02/09/2010  [facility] Patient Information 2013 [place].

## 2013-01-30 NOTE — ED Notes (Signed)
Pt with right foot swelling, states that he was putting in a bathroom floor on Saturday and woke up Sunday morning with swelling, unknown of injury

## 2013-05-29 DIAGNOSIS — I959 Hypotension, unspecified: Secondary | ICD-10-CM

## 2013-08-04 ENCOUNTER — Encounter (HOSPITAL_COMMUNITY): Payer: Self-pay | Admitting: *Deleted

## 2013-08-04 ENCOUNTER — Emergency Department (HOSPITAL_COMMUNITY)
Admission: EM | Admit: 2013-08-04 | Discharge: 2013-08-04 | Disposition: A | Discharge status: Home / Self Care | Payer: Medicaid Other | Attending: Emergency Medicine | Admitting: Emergency Medicine

## 2013-08-04 DIAGNOSIS — L03115 Cellulitis of right lower limb: Secondary | ICD-10-CM

## 2013-08-04 DIAGNOSIS — Z880 Allergy status to penicillin: Secondary | ICD-10-CM | POA: Insufficient documentation

## 2013-08-04 DIAGNOSIS — J45909 Unspecified asthma, uncomplicated: Secondary | ICD-10-CM | POA: Insufficient documentation

## 2013-08-04 DIAGNOSIS — F17200 Nicotine dependence, unspecified, uncomplicated: Secondary | ICD-10-CM | POA: Insufficient documentation

## 2013-08-04 DIAGNOSIS — Z79899 Other long term (current) drug therapy: Secondary | ICD-10-CM | POA: Insufficient documentation

## 2013-08-04 DIAGNOSIS — I10 Essential (primary) hypertension: Secondary | ICD-10-CM | POA: Insufficient documentation

## 2013-08-04 DIAGNOSIS — L02419 Cutaneous abscess of limb, unspecified: Secondary | ICD-10-CM | POA: Insufficient documentation

## 2013-08-04 DIAGNOSIS — Z8719 Personal history of other diseases of the digestive system: Secondary | ICD-10-CM | POA: Insufficient documentation

## 2013-08-04 HISTORY — DX: Cellulitis, unspecified: L03.90

## 2013-08-04 LAB — CBC WITH DIFFERENTIAL/PLATELET
Basophils Absolute: 0 10*3/uL (ref 0.0–0.1)
Basophils Relative: 0 % (ref 0–1)
Eosinophils Absolute: 0.3 10*3/uL (ref 0.0–0.7)
Eosinophils Relative: 3 % (ref 0–5)
HCT: 38.9 % — ABNORMAL LOW (ref 39.0–52.0)
Hemoglobin: 13.6 g/dL (ref 13.0–17.0)
Lymphocytes Relative: 22 % (ref 12–46)
Lymphs Abs: 2.4 10*3/uL (ref 0.7–4.0)
MCH: 31.9 pg (ref 26.0–34.0)
MCHC: 35 g/dL (ref 30.0–36.0)
MCV: 91.1 fL (ref 78.0–100.0)
Monocytes Absolute: 0.6 10*3/uL (ref 0.1–1.0)
Monocytes Relative: 5 % (ref 3–12)
Neutro Abs: 7.5 10*3/uL (ref 1.7–7.7)
Neutrophils Relative %: 70 % (ref 43–77)
Platelets: 205 10*3/uL (ref 150–400)
RBC: 4.27 MIL/uL (ref 4.22–5.81)
RDW: 13.5 % (ref 11.5–15.5)
WBC: 10.7 10*3/uL — ABNORMAL HIGH (ref 4.0–10.5)

## 2013-08-04 LAB — BASIC METABOLIC PANEL
BUN: 9 mg/dL (ref 6–23)
CO2: 29 mEq/L (ref 19–32)
Calcium: 9.7 mg/dL (ref 8.4–10.5)
Chloride: 98 mEq/L (ref 96–112)
Creatinine, Ser: 0.98 mg/dL (ref 0.50–1.35)
GFR calc Af Amer: >90 mL/min (ref >90)
GFR calc non Af Amer: >90 mL/min (ref >90)
Glucose, Bld: 161 mg/dL — ABNORMAL HIGH (ref 70–99)
Potassium: 3.4 meq/L — ABNORMAL LOW (ref 3.5–5.1)
Sodium: 136 meq/L (ref 135–145)

## 2013-08-04 LAB — D-DIMER, QUANTITATIVE: D-Dimer, Quant: 0.61 ug/mL-FEU — ABNORMAL HIGH (ref 0.00–0.48)

## 2013-08-04 MED ORDER — HYDROCODONE-ACETAMINOPHEN 5-325 MG PO TABS: 2.0000 | ORAL_TABLET | ORAL | Status: DC | PRN

## 2013-08-04 MED ORDER — ENOXAPARIN SODIUM 150 MG/ML ~~LOC~~ SOLN
1.0000 mg/kg | Freq: Once | SUBCUTANEOUS | Status: AC
  Administered 2013-08-04: 150 mg via SUBCUTANEOUS
  Filled 2013-08-04: qty 1

## 2013-08-04 MED ORDER — CEPHALEXIN 500 MG PO CAPS: 500.0000 mg | ORAL_CAPSULE | Freq: Four times a day (QID) | ORAL | Status: DC

## 2013-08-04 MED ORDER — DEXTROSE 5 % IV SOLN
1.0000 g | Freq: Once | INTRAVENOUS | Status: AC
  Administered 2013-08-04: 1 g via INTRAVENOUS
  Filled 2013-08-04: qty 10

## 2013-08-04 NOTE — ED Provider Notes (Signed)
CSN: [id number]     Arrival date & time 08/04/13  1608 History  This chart was scribed for Geoffery Lyons, MD by Henri Medal, ED Scribe and Bennett Scrape, ED Scribe. This patient was seen in room APA09/APA09 and the patient's care was started at 5:00PM.   Chief Complaint  Patient presents with  . Leg Swelling    The history is provided by the patient. No language interpreter was used.   HPI Comments: Ethan [name] is a 42 y.o. male with a h/o cellulitis who presents to the Emergency Department complaining of gradual onset, gradually worsening right lower leg swelling that started 2 days ago with associated pain described as soreness and redness. He reports that he has tried ice with no improvement. He is here for evaluation today due to increased swelling and redness today. Pt denies fever, nausea and calf pain. He reports one prior episode of more severe symptoms diagnosed as cellulitis 4 to 5 years ago but denies any reoccurrence of symptoms until now. Pt has a history of HTN and HLD but denies having a h/o DM  Past Medical History  Diagnosis Date  . Hypertension   . Asthma   . Hypokalemia   . Diverticulitis   . Cellulitis    Past Surgical History  Procedure Laterality Date  . Arm wound repair / closure     No family history on file. History  Substance Use Topics  . Smoking status: Current Every Day Smoker -- 0.50 packs/day    Types: Cigarettes  . Smokeless tobacco: Not on file  . Alcohol Use: No    Review of Systems  Constitutional: Negative for fever.  Musculoskeletal: Positive for joint swelling (right leg).  Skin: Positive for color change.  All other systems reviewed and are negative.    Allergies  Penicillins  Home Medications   Current Outpatient Rx  Name  Route  Sig  Dispense  Refill  . albuterol (PROVENTIL HFA;VENTOLIN HFA) 108 (90 BASE) MCG/ACT inhaler   Inhalation   Inhale 2 puffs into the lungs every 6 (six) hours as needed for wheezing or  shortness of breath.         . ALPRAZolam (XANAX) 1 MG tablet   Oral   Take 1 mg by mouth 4 (four) times daily as needed for sleep or anxiety.         Marland Kitchen dexamethasone (DECADRON) 6 MG tablet      1 po daily with food   6 tablet   0   . diclofenac (VOLTAREN) 75 MG EC tablet   Oral   Take 1 tablet (75 mg total) by mouth 2 (two) times daily.   12 tablet   0   . HYDROcodone-acetaminophen (NORCO/VICODIN) 5-325 MG per tablet      1 or 2 po q4h prn pain   20 tablet   0   . ibuprofen (ADVIL,MOTRIN) 600 MG tablet   Oral   Take 600 mg by mouth daily as needed for pain.         Marland Kitchen EXPIRED: lisinopril-hydrochlorothiazide (PRINZIDE,ZESTORETIC) 20-12.5 MG per tablet   Oral   Take 1 tablet by mouth daily.         . pravastatin (PRAVACHOL) 20 MG tablet   Oral   Take 20 mg by mouth every morning.          Triage Vitals: BP 147/84  Pulse 100  Temp(Src) 98.7 F (37.1 C) (Oral)  Resp 20  Ht 6\' 3"  (1.905 m)  Wt 329 lb (149.233 kg)  BMI 41.12 kg/m2  SpO2 98%  Physical Exam  Nursing note and vitals reviewed. Constitutional: He is oriented to person, place, and time. He appears well-developed and well-nourished. No distress.  HENT:  Head: Normocephalic and atraumatic.  Eyes: Conjunctivae and EOM are normal.  Neck: Normal range of motion. Neck supple. No tracheal deviation present.  Cardiovascular: Normal rate, regular rhythm and normal heart sounds.   No murmur heard. Pulmonary/Chest: Effort normal and breath sounds normal. No respiratory distress. He has no wheezes. He has no rales.  Abdominal: Soft. Bowel sounds are normal. There is no tenderness.  Musculoskeletal: Normal range of motion. He exhibits tenderness.  Right lower extremity is noted to have redness, warmth, and tenderness to the mid-tibia into the ankle and foot. The dorsalis pedis pulse is easily palpable and Denna Haggard' sign is absent.   Neurological: He is alert and oriented to person, place, and time. No  cranial nerve deficit.  Skin: Skin is warm and dry.  Psychiatric: He has a normal mood and affect. His behavior is normal.    ED Course  Procedures (including critical care time)  Medications  cefTRIAXone (ROCEPHIN) 1 g in dextrose 5 % 50 mL IVPB (not administered)    DIAGNOSTIC STUDIES: Oxygen Saturation is 98% on room air, normal by my interpretation.    COORDINATION OF CARE: 5:07 PM- Discussed suspicion of cellulitis with pt. Discussed treatment plan which includes IV antibiotics, CBC panel, BMP and d-dimer with pt at bedside and pt agreed to plan.    Labs Review Labs Reviewed - No data to display Imaging Review No results found.  MDM  No diagnosis found. Patient presents here with complaints of right lower leg redness pain and swelling with a history of cellulitis several years ago. I suspect that is what this is in this situation and have given IV ceftriaxone. His white count is 10.7 and he is afebrile and nontoxic appearing. He prefers to go home as he has 3 small children and does not want to stay in the hospital. He will be treated with Keflex. His d-dimer is mildly elevated, however I have a low suspicion that this is a DVT. I will bring him back tomorrow for an ultrasound to confirm this. He is to return to the emergency department should he develop any chest pain or shortness of breath.  I personally performed the services described in this documentation, which was scribed in my presence. The recorded information has been reviewed and is accurate.      Geoffery Lyons, MD 08/04/13 [phone]

## 2013-08-04 NOTE — Discharge Instructions (Signed)
Cellulitis Cellulitis is an infection of the skin and the tissue beneath it. The infected area is usually red and tender. Cellulitis occurs most often in the arms and lower legs.  CAUSES  Cellulitis is caused by bacteria that enter the skin through cracks or cuts in the skin. The most common types of bacteria that cause cellulitis are Staphylococcus and Streptococcus. SYMPTOMS   Redness and warmth.  Swelling.  Tenderness or pain.  Fever. DIAGNOSIS  Your caregiver can usually determine what is wrong based on a physical exam. Blood tests may also be done. TREATMENT  Treatment usually involves taking an antibiotic medicine. HOME CARE INSTRUCTIONS   Take your antibiotics as directed. Finish them even if you start to feel better.  Keep the infected arm or leg elevated to reduce swelling.  Apply a warm cloth to the affected area up to 4 times per day to relieve pain.  Only take over-the-counter or prescription medicines for pain, discomfort, or fever as directed by your caregiver.  Keep all follow-up appointments as directed by your caregiver. SEEK MEDICAL CARE IF:   You notice red streaks coming from the infected area.  Your red area gets larger or turns dark in color.  Your bone or joint underneath the infected area becomes painful after the skin has healed.  Your infection returns in the same area or another area.  You notice a swollen bump in the infected area.  You develop new symptoms. SEEK IMMEDIATE MEDICAL CARE IF:   You have a fever.  You feel very sleepy.  You develop vomiting or diarrhea.  You have a general ill feeling (malaise) with muscle aches and pains. MAKE SURE YOU:   Understand these instructions.  Will watch your condition.  Will get help right away if you are not doing well or get worse. Document Released: 08/25/2005 Document Revised: 05/16/2012 Document Reviewed: 01/31/2012 [employer] Patient Information 2014 [employer].  

## 2013-08-04 NOTE — ED Notes (Signed)
Pt presents with Rt lower leg and foot edema, +2 edema noted at this time.  Pt reports this is not new to him as he has been hospitalized in the past for said cellulitis. Positve pulses notes in bilateral extremities. NAD noted. Pt denies SOB.

## 2013-08-04 NOTE — ED Notes (Signed)
Pt aware of 1000 appointment for ultrasound appointment in the am.

## 2013-08-04 NOTE — ED Notes (Signed)
Pt c/o swelling, redness to right lower leg, denies any injury, has hx of cellulitis that required hospital admission,

## 2013-08-05 ENCOUNTER — Ambulatory Visit (HOSPITAL_COMMUNITY)
Admit: 2013-08-05 | Discharge: 2013-08-05 | Disposition: A | Discharge status: Home / Self Care | Payer: Medicaid Other | Attending: Emergency Medicine | Admitting: Emergency Medicine

## 2013-08-05 DIAGNOSIS — M79609 Pain in unspecified limb: Secondary | ICD-10-CM | POA: Insufficient documentation

## 2013-08-05 MED ORDER — SULFAMETHOXAZOLE-TRIMETHOPRIM 800-160 MG PO TABS: 1.0000 | ORAL_TABLET | Freq: Two times a day (BID) | ORAL | Status: DC

## 2013-08-05 NOTE — ED Provider Notes (Signed)
Medical screening examination/treatment/procedure(s) were performed by non-physician practitioner and as supervising physician I was immediately available for consultation/collaboration.  Donnetta Hutching, MD 08/05/13 1550

## 2013-08-05 NOTE — ED Provider Notes (Signed)
Pt presents today for venous doppler which was negative,  Discussed with patient and wife.  He has picked up his keflex this am  Has not started yet.  He has persistent pain,  No worsened swelling or spread of redness and swelling since last night.  Discussed with Dr. Adriana Simas and we will add bactrim to abx regimen, advised to continue keflex, f/u with pcp for a recheck this week.  Burgess Amor, PA-C 08/05/13 1119

## 2013-08-07 ENCOUNTER — Encounter (HOSPITAL_COMMUNITY): Payer: Self-pay | Admitting: *Deleted

## 2013-08-07 ENCOUNTER — Emergency Department (HOSPITAL_COMMUNITY): Payer: Medicaid Other

## 2013-08-07 ENCOUNTER — Emergency Department (HOSPITAL_COMMUNITY)
Admission: EM | Admit: 2013-08-07 | Discharge: 2013-08-07 | Disposition: A | Discharge status: Home / Self Care | Payer: Medicaid Other | Attending: Emergency Medicine | Admitting: Emergency Medicine

## 2013-08-07 DIAGNOSIS — X500XXA Overexertion from strenuous movement or load, initial encounter: Secondary | ICD-10-CM | POA: Insufficient documentation

## 2013-08-07 DIAGNOSIS — J45909 Unspecified asthma, uncomplicated: Secondary | ICD-10-CM | POA: Insufficient documentation

## 2013-08-07 DIAGNOSIS — M549 Dorsalgia, unspecified: Secondary | ICD-10-CM

## 2013-08-07 DIAGNOSIS — E876 Hypokalemia: Secondary | ICD-10-CM | POA: Insufficient documentation

## 2013-08-07 DIAGNOSIS — Z872 Personal history of diseases of the skin and subcutaneous tissue: Secondary | ICD-10-CM | POA: Insufficient documentation

## 2013-08-07 DIAGNOSIS — F17200 Nicotine dependence, unspecified, uncomplicated: Secondary | ICD-10-CM | POA: Insufficient documentation

## 2013-08-07 DIAGNOSIS — Z79899 Other long term (current) drug therapy: Secondary | ICD-10-CM | POA: Insufficient documentation

## 2013-08-07 DIAGNOSIS — IMO0002 Reserved for concepts with insufficient information to code with codable children: Secondary | ICD-10-CM | POA: Insufficient documentation

## 2013-08-07 DIAGNOSIS — Z8719 Personal history of other diseases of the digestive system: Secondary | ICD-10-CM | POA: Insufficient documentation

## 2013-08-07 DIAGNOSIS — I10 Essential (primary) hypertension: Secondary | ICD-10-CM | POA: Insufficient documentation

## 2013-08-07 DIAGNOSIS — Z880 Allergy status to penicillin: Secondary | ICD-10-CM | POA: Insufficient documentation

## 2013-08-07 DIAGNOSIS — Z792 Long term (current) use of antibiotics: Secondary | ICD-10-CM | POA: Insufficient documentation

## 2013-08-07 DIAGNOSIS — R21 Rash and other nonspecific skin eruption: Secondary | ICD-10-CM | POA: Insufficient documentation

## 2013-08-07 DIAGNOSIS — Y929 Unspecified place or not applicable: Secondary | ICD-10-CM | POA: Insufficient documentation

## 2013-08-07 DIAGNOSIS — Y9389 Activity, other specified: Secondary | ICD-10-CM | POA: Insufficient documentation

## 2013-08-07 MED ORDER — HYDROCODONE-ACETAMINOPHEN 5-325 MG PO TABS: 1.0000 | ORAL_TABLET | Freq: Four times a day (QID) | ORAL | Status: DC | PRN

## 2013-08-07 MED ORDER — HYDROMORPHONE HCL PF 1 MG/ML IJ SOLN
1.0000 mg | Freq: Once | INTRAMUSCULAR | Status: AC
  Administered 2013-08-07: 1 mg via INTRAMUSCULAR
  Filled 2013-08-07: qty 1

## 2013-08-07 MED ORDER — CYCLOBENZAPRINE HCL 10 MG PO TABS: 10.0000 mg | ORAL_TABLET | Freq: Three times a day (TID) | ORAL | Status: DC | PRN

## 2013-08-07 NOTE — ED Provider Notes (Signed)
CSN: [id number]     Arrival date & time 08/07/13  0740 History  This chart was scribed for Benny Lennert, MD by Caryn Bee, ED Scribe. This patient was seen in room APA18/APA18 and the patient's care was started 8:10 AM.     Chief Complaint  Patient presents with  . Back Pain   Patient is a 42 y.o. male presenting with back pain. The history is provided by the patient. No language interpreter was used.  Back Pain Location:  Lumbar spine Quality: Sharp. Radiates to:  Does not radiate Pain severity:  Moderate Onset quality:  Sudden Duration:  1 day Timing:  Constant Progression:  Unchanged Chronicity:  New Context: lifting heavy objects (Box)   Relieved by:  None tried Worsened by:  Movement Ineffective treatments:  None tried Associated symptoms: no abdominal pain, no chest pain and no headaches    HPI Comments: Ethan [name] is a 42 y.o. male who presents to the Emergency Department complaining of constant, moderate, "sharp", non-radiating lower back pain that began while lifting a box yesterday. Pt reports pain is exacerbated by movement and by lifting his legs. Pt states that has been prescribed antibiotics for a cellulitis to his right ankle here on 08/04/13 and was also given an ultrasound to rule out DVT. He states that the cellulitis is improving. Pt has a follow-up appointment for his leg on 08/10/13. Pt denies previous back injury. Pt is a current every day smoker of 0.5 packs/day and denies alcohol use.  PCP- Dr. Assunta Found   Past Medical History  Diagnosis Date  . Hypertension   . Asthma   . Hypokalemia   . Diverticulitis   . Cellulitis    Past Surgical History  Procedure Laterality Date  . Arm wound repair / closure     No family history on file. History  Substance Use Topics  . Smoking status: Current Every Day Smoker -- 0.50 packs/day    Types: Cigarettes  . Smokeless tobacco: Not on file  . Alcohol Use: No    Review of Systems   Constitutional: Negative for appetite change and fatigue.  HENT: Negative for congestion, sinus pressure and ear discharge.   Eyes: Negative for discharge.  Respiratory: Negative for cough.   Cardiovascular: Negative for chest pain.  Gastrointestinal: Negative for abdominal pain and diarrhea.  Genitourinary: Negative for frequency and hematuria.  Musculoskeletal: Positive for back pain.  Skin: Positive for rash (Healing cellulitis to LLE).  Neurological: Negative for seizures and headaches.  Psychiatric/Behavioral: Negative for hallucinations.  All other systems reviewed and are negative.    Allergies  Penicillins  Home Medications   Current Outpatient Rx  Name  Route  Sig  Dispense  Refill  . albuterol (PROVENTIL HFA;VENTOLIN HFA) 108 (90 BASE) MCG/ACT inhaler   Inhalation   Inhale 2 puffs into the lungs every 6 (six) hours as needed for wheezing or shortness of breath.         . ALPRAZolam (XANAX) 1 MG tablet   Oral   Take 1 mg by mouth 4 (four) times daily as needed for sleep or anxiety.         . cephALEXin (KEFLEX) 500 MG capsule   Oral   Take 1 capsule (500 mg total) by mouth 4 (four) times daily.   40 capsule   0   . chlorproMAZINE (THORAZINE) 25 MG tablet   Oral   Take 25 mg by mouth 4 (four) times daily.         Marland Kitchen  cholestyramine (QUESTRAN) 4 G packet   Oral   Take 0.5 packets by mouth 2 (two) times daily.         Marland Kitchen HYDROcodone-acetaminophen (NORCO) 5-325 MG per tablet   Oral   Take 2 tablets by mouth every 4 (four) hours as needed for pain.   15 tablet   0   . lisinopril-hydrochlorothiazide (PRINZIDE,ZESTORETIC) 20-25 MG per tablet   Oral   Take 1 tablet by mouth daily.         . niacin 250 MG CR capsule   Oral   Take 250 mg by mouth daily.         Marland Kitchen omeprazole (PRILOSEC) 40 MG capsule   Oral   Take 40 mg by mouth daily.         . sucralfate (CARAFATE) 1 GM/10ML suspension   Oral   Take 1 g by mouth 4 (four) times daily.          Marland Kitchen sulfamethoxazole-trimethoprim (SEPTRA DS) 800-160 MG per tablet   Oral   Take 1 tablet by mouth every 12 (twelve) hours.   20 tablet   0    Triage Vitals: BP 149/84  Pulse 88  Temp(Src) 97.7 F (36.5 C) (Oral)  Resp 20  SpO2 93%  Physical Exam  Nursing note and vitals reviewed. Constitutional: He is oriented to person, place, and time. He appears well-developed.  HENT:  Head: Normocephalic.  Eyes: Conjunctivae and EOM are normal. No scleral icterus.  Neck: Neck supple. No thyromegaly present.  Cardiovascular: Normal rate, regular rhythm and normal heart sounds.  Exam reveals no gallop and no friction rub.   No murmur heard. Pulmonary/Chest: Effort normal and breath sounds normal. No stridor. He has no wheezes. He has no rales. He exhibits no tenderness.  Abdominal: Soft. He exhibits no distension. There is no tenderness. There is no rebound.  Musculoskeletal: Normal range of motion. He exhibits no edema.  Moderate tenderness to lumbar spine. Negative straight leg raise.  Lymphadenopathy:    He has no cervical adenopathy.  Neurological: He is oriented to person, place, and time. Coordination normal.  Skin: Rash (Right ankle) noted. No erythema.  Right ankle rash and swelling consistent with cellulitis.  Psychiatric: He has a normal mood and affect. His behavior is normal.    ED Course  Procedures (including critical care time) DIAGNOSTIC STUDIES: Oxygen Saturation is 93% on room air, adequate by my interpretation.    COORDINATION OF CARE: 8:13 AM- Will order Dilaudid for pain. Will also order an x-ray of pt's L-spine. Discussed treatment plan with pt at bedside and pt agreed to plan.   10:04 AM- Will order muscle relaxer. Advised pt to return to ED if cellulitis does not improve.   Medications  HYDROmorphone (DILAUDID) injection 1 mg (1 mg Intramuscular Given 08/07/13 0826)   Labs Review Labs Reviewed - No data to display Imaging Review  Dg Lumbar Spine  Complete  08/07/2013   *RADIOLOGY REPORT*  Clinical Data: Low back pain post lifting injury  LUMBAR SPINE - COMPLETE 4+ VIEW  Comparison: CT abdomen pelvis - 07/05/2012  Findings:  There are five non-rib bearing lumbar type vertebral bodies.  Normal alignment of the lumbar spine.  No anterolisthesis or retrolisthesis.  No definite pars defects.  Lumbar vertebral body heights are preserved.  Redemonstrated exuberant syndesmotic formation about the anterior aspect of the L3 - L4 intervertebral disc space as demonstrated on prior abdominal CT.  Intervertebral disc spaces are preserved.  Limited visualization  of bilateral SI joints are normal.  Moderate to large colonic stool burden.  Regional soft tissues otherwise normal.  IMPRESSION: No acute findings.   Original Report Authenticated By: Tacey Ruiz, MD     MDM  No diagnosis found. The chart was scribed for me under my direct supervision.  I personally performed the history, physical, and medical decision making and all procedures in the evaluation of this patient.Benny Lennert, MD 08/07/13 1014

## 2013-08-07 NOTE — Discharge Instructions (Signed)
Follow up with your md as scheduled °

## 2013-08-07 NOTE — ED Notes (Addendum)
Pt c/o lower back pain that started after picking up a box of meat yesterday. Pain is not radiating, worse when he tries to get up from sitting or lying position, pt recently seen in er for cellulitis of right lower leg, states that the cellulitis is getting better.

## 2013-08-09 ENCOUNTER — Inpatient Hospital Stay (HOSPITAL_COMMUNITY): Payer: Medicaid Other

## 2013-08-09 ENCOUNTER — Emergency Department (HOSPITAL_COMMUNITY): Payer: Medicaid Other

## 2013-08-09 ENCOUNTER — Inpatient Hospital Stay (HOSPITAL_COMMUNITY)
Admission: EM | Admit: 2013-08-09 | Discharge: 2013-08-11 | DRG: 603 | Disposition: A | Discharge status: Home / Self Care | Payer: Medicaid Other | Attending: Internal Medicine | Admitting: Internal Medicine

## 2013-08-09 ENCOUNTER — Encounter (HOSPITAL_COMMUNITY): Payer: Self-pay

## 2013-08-09 DIAGNOSIS — E669 Obesity, unspecified: Secondary | ICD-10-CM | POA: Diagnosis present

## 2013-08-09 DIAGNOSIS — Z79899 Other long term (current) drug therapy: Secondary | ICD-10-CM

## 2013-08-09 DIAGNOSIS — F17200 Nicotine dependence, unspecified, uncomplicated: Secondary | ICD-10-CM | POA: Diagnosis present

## 2013-08-09 DIAGNOSIS — L0390 Cellulitis, unspecified: Secondary | ICD-10-CM

## 2013-08-09 DIAGNOSIS — E876 Hypokalemia: Secondary | ICD-10-CM | POA: Diagnosis present

## 2013-08-09 DIAGNOSIS — R7309 Other abnormal glucose: Secondary | ICD-10-CM | POA: Diagnosis present

## 2013-08-09 DIAGNOSIS — L03115 Cellulitis of right lower limb: Secondary | ICD-10-CM

## 2013-08-09 DIAGNOSIS — M109 Gout, unspecified: Secondary | ICD-10-CM | POA: Diagnosis present

## 2013-08-09 DIAGNOSIS — I10 Essential (primary) hypertension: Secondary | ICD-10-CM | POA: Diagnosis present

## 2013-08-09 DIAGNOSIS — L02419 Cutaneous abscess of limb, unspecified: Principal | ICD-10-CM | POA: Diagnosis present

## 2013-08-09 DIAGNOSIS — Z6841 Body Mass Index (BMI) 40.0 and over, adult: Secondary | ICD-10-CM

## 2013-08-09 DIAGNOSIS — J45909 Unspecified asthma, uncomplicated: Secondary | ICD-10-CM | POA: Diagnosis present

## 2013-08-09 LAB — CBC WITH DIFFERENTIAL/PLATELET
Basophils Absolute: 0 10*3/uL (ref 0.0–0.1)
Basophils Relative: 0 % (ref 0–1)
Eosinophils Absolute: 0.3 10*3/uL (ref 0.0–0.7)
Eosinophils Relative: 3 % (ref 0–5)
HCT: 39.5 % (ref 39.0–52.0)
Hemoglobin: 13.5 g/dL (ref 13.0–17.0)
Lymphocytes Relative: 23 % (ref 12–46)
Lymphs Abs: 2.1 10*3/uL (ref 0.7–4.0)
MCH: 31.2 pg (ref 26.0–34.0)
MCHC: 34.2 g/dL (ref 30.0–36.0)
MCV: 91.2 fL (ref 78.0–100.0)
Monocytes Absolute: 0.6 10*3/uL (ref 0.1–1.0)
Monocytes Relative: 7 % (ref 3–12)
Neutro Abs: 6.4 10*3/uL (ref 1.7–7.7)
Neutrophils Relative %: 68 % (ref 43–77)
Platelets: 245 10*3/uL (ref 150–400)
RBC: 4.33 MIL/uL (ref 4.22–5.81)
RDW: 13.5 % (ref 11.5–15.5)
WBC: 9.4 10*3/uL (ref 4.0–10.5)

## 2013-08-09 LAB — URINALYSIS, ROUTINE W REFLEX MICROSCOPIC
Bilirubin Urine: NEGATIVE
Glucose, UA: 100 mg/dL — AB
Ketones, ur: NEGATIVE mg/dL
Leukocytes, UA: NEGATIVE
Nitrite: NEGATIVE
Protein, ur: NEGATIVE mg/dL
Specific Gravity, Urine: 1.02 (ref 1.005–1.030)
Urobilinogen, UA: 1 mg/dL (ref 0.0–1.0)
pH: 5.5 (ref 5.0–8.0)

## 2013-08-09 LAB — COMPREHENSIVE METABOLIC PANEL
ALT: 16 U/L (ref 0–53)
AST: 17 U/L (ref 0–37)
Albumin: 3.5 g/dL (ref 3.5–5.2)
Alkaline Phosphatase: 87 U/L (ref 39–117)
BUN: 16 mg/dL (ref 6–23)
CO2: 28 mEq/L (ref 19–32)
Calcium: 9.8 mg/dL (ref 8.4–10.5)
Chloride: 97 mEq/L (ref 96–112)
Creatinine, Ser: 1.31 mg/dL (ref 0.50–1.35)
GFR calc Af Amer: 77 mL/min — ABNORMAL LOW (ref >90)
GFR calc non Af Amer: 67 mL/min — ABNORMAL LOW (ref >90)
Glucose, Bld: 154 mg/dL — ABNORMAL HIGH (ref 70–99)
Potassium: 3.3 mEq/L — ABNORMAL LOW (ref 3.5–5.1)
Sodium: 136 mEq/L (ref 135–145)
Total Bilirubin: 0.3 mg/dL (ref 0.3–1.2)
Total Protein: 7.6 g/dL (ref 6.0–8.3)

## 2013-08-09 LAB — HEMOGLOBIN A1C
Hgb A1c MFr Bld: 6.3 % — ABNORMAL HIGH (ref <5.7)
Mean Plasma Glucose: 134 mg/dL — ABNORMAL HIGH (ref <117)

## 2013-08-09 LAB — URIC ACID: Uric Acid, Serum: 8.6 mg/dL — ABNORMAL HIGH (ref 4.0–7.8)

## 2013-08-09 LAB — URINE MICROSCOPIC-ADD ON

## 2013-08-09 MED ORDER — VANCOMYCIN HCL 10 G IV SOLR
1500.0000 mg | Freq: Two times a day (BID) | INTRAVENOUS | Status: DC
  Administered 2013-08-10 – 2013-08-11 (×3): 1500 mg via INTRAVENOUS
  Filled 2013-08-09 (×8): qty 1500

## 2013-08-09 MED ORDER — LISINOPRIL-HYDROCHLOROTHIAZIDE 20-25 MG PO TABS: 1.0000 | ORAL_TABLET | Freq: Every day | ORAL | Status: DC

## 2013-08-09 MED ORDER — SODIUM CHLORIDE 0.9 % IJ SOLN: 3.0000 mL | Freq: Two times a day (BID) | INTRAMUSCULAR | Status: DC

## 2013-08-09 MED ORDER — SODIUM CHLORIDE 0.9 % IV SOLN
250.0000 mL | INTRAVENOUS | Status: DC | PRN
Start: 2013-08-09 — End: 2013-08-11

## 2013-08-09 MED ORDER — ONDANSETRON HCL 4 MG/2ML IJ SOLN: 4.0000 mg | Freq: Three times a day (TID) | INTRAMUSCULAR | Status: AC | PRN

## 2013-08-09 MED ORDER — ALPRAZOLAM 1 MG PO TABS
1.0000 mg | ORAL_TABLET | Freq: Four times a day (QID) | ORAL | Status: DC
  Administered 2013-08-09 – 2013-08-11 (×7): 1 mg via ORAL
  Filled 2013-08-09 (×7): qty 1

## 2013-08-09 MED ORDER — NIACIN ER 250 MG PO CPCR
250.0000 mg | ORAL_CAPSULE | Freq: Every day | ORAL | Status: DC
  Administered 2013-08-10 – 2013-08-11 (×2): 250 mg via ORAL
  Filled 2013-08-09 (×5): qty 1

## 2013-08-09 MED ORDER — ACETAMINOPHEN 650 MG RE SUPP: 650.0000 mg | Freq: Four times a day (QID) | RECTAL | Status: DC | PRN

## 2013-08-09 MED ORDER — LISINOPRIL 10 MG PO TABS
20.0000 mg | ORAL_TABLET | Freq: Every day | ORAL | Status: DC
  Administered 2013-08-10 – 2013-08-11 (×2): 20 mg via ORAL
  Filled 2013-08-09: qty 1
  Filled 2013-08-09: qty 2
  Filled 2013-08-09: qty 1
  Filled 2013-08-09: qty 2

## 2013-08-09 MED ORDER — SODIUM CHLORIDE 0.9 % IV SOLN
INTRAVENOUS | Status: DC
  Administered 2013-08-09: 1000 mL via INTRAVENOUS
  Administered 2013-08-10: via INTRAVENOUS

## 2013-08-09 MED ORDER — HYDROMORPHONE HCL PF 1 MG/ML IJ SOLN
1.0000 mg | INTRAMUSCULAR | Status: AC | PRN
  Administered 2013-08-09 (×2): 1 mg via INTRAVENOUS
  Filled 2013-08-09 (×2): qty 1

## 2013-08-09 MED ORDER — SODIUM CHLORIDE 0.9 % IV SOLN: INTRAVENOUS | Status: AC

## 2013-08-09 MED ORDER — HYDROCHLOROTHIAZIDE 25 MG PO TABS
25.0000 mg | ORAL_TABLET | Freq: Every day | ORAL | Status: DC
  Administered 2013-08-10 – 2013-08-11 (×2): 25 mg via ORAL
  Filled 2013-08-09 (×3): qty 1

## 2013-08-09 MED ORDER — VANCOMYCIN HCL 10 G IV SOLR
2000.0000 mg | Freq: Once | INTRAVENOUS | Status: AC
  Administered 2013-08-09: 2000 mg via INTRAVENOUS
  Filled 2013-08-09: qty 2000

## 2013-08-09 MED ORDER — GADOBENATE DIMEGLUMINE 529 MG/ML IV SOLN
20.0000 mL | Freq: Once | INTRAVENOUS | Status: AC | PRN
  Administered 2013-08-09: 20 mL via INTRAVENOUS

## 2013-08-09 MED ORDER — VANCOMYCIN HCL IN DEXTROSE 1-5 GM/200ML-% IV SOLN
1000.0000 mg | Freq: Once | INTRAVENOUS | Status: AC
  Administered 2013-08-09: 1000 mg via INTRAVENOUS
  Filled 2013-08-09: qty 200

## 2013-08-09 MED ORDER — SODIUM CHLORIDE 0.9 % IJ SOLN: 3.0000 mL | INTRAMUSCULAR | Status: DC | PRN

## 2013-08-09 MED ORDER — ACETAMINOPHEN 325 MG PO TABS: 650.0000 mg | ORAL_TABLET | Freq: Four times a day (QID) | ORAL | Status: DC | PRN

## 2013-08-09 MED ORDER — ONDANSETRON HCL 4 MG/2ML IJ SOLN
4.0000 mg | Freq: Once | INTRAMUSCULAR | Status: AC
  Administered 2013-08-09: 4 mg via INTRAVENOUS
  Filled 2013-08-09: qty 2

## 2013-08-09 MED ORDER — ENOXAPARIN SODIUM 40 MG/0.4ML ~~LOC~~ SOLN
40.0000 mg | SUBCUTANEOUS | Status: DC
  Administered 2013-08-09 – 2013-08-11 (×3): 40 mg via SUBCUTANEOUS
  Filled 2013-08-09 (×3): qty 0.4

## 2013-08-09 MED ORDER — HYDROCODONE-ACETAMINOPHEN 5-325 MG PO TABS
1.0000 | ORAL_TABLET | ORAL | Status: DC | PRN
  Administered 2013-08-09 – 2013-08-10 (×4): 2 via ORAL
  Filled 2013-08-09 (×4): qty 2

## 2013-08-09 MED ORDER — SODIUM CHLORIDE 0.9 % IV BOLUS (SEPSIS)
500.0000 mL | Freq: Once | INTRAVENOUS | Status: AC
  Administered 2013-08-09: 500 mL via INTRAVENOUS

## 2013-08-09 MED ORDER — HYDROMORPHONE HCL PF 1 MG/ML IJ SOLN
1.0000 mg | Freq: Once | INTRAMUSCULAR | Status: AC
  Administered 2013-08-09: 1 mg via INTRAVENOUS
  Filled 2013-08-09: qty 1

## 2013-08-09 MED ORDER — PANTOPRAZOLE SODIUM 40 MG PO TBEC
40.0000 mg | DELAYED_RELEASE_TABLET | Freq: Every day | ORAL | Status: DC
  Administered 2013-08-10 – 2013-08-11 (×2): 40 mg via ORAL
  Filled 2013-08-09 (×2): qty 1

## 2013-08-09 NOTE — ED Notes (Signed)
Was here this past week and had an ultrasound on my leg to make sure I did not have a blood clot, had that on Monday. Diagnosed with cellulitis of my right lower leg. Was told to come back if it became worse. Patient states that it is worse than before. Patient's family states that the swelling is worse and it is hot to the touch now. Redness and swelling noted to lower right leg and foot. Lower right leg has a raised round area ~ 2 inches around that resembles a blister.

## 2013-08-09 NOTE — Progress Notes (Signed)
ANTIBIOTIC CONSULT NOTE - INITIAL  Pharmacy Consult for Vancomycin Indication: cellulitis  Allergies  Allergen Reactions  . Penicillins Other (See Comments)    CHILDHOOD ALLERGY    Patient Measurements: Height: 6\' 3"  (190.5 cm) Weight: 331 lb 12.8 oz (150.503 kg) IBW/kg (Calculated) : 84.5  Vital Signs: Temp: 97.6 F (36.4 C) (09/11 1031) Temp src: Oral (09/11 1031) BP: 136/89 mmHg (09/11 1031) Pulse Rate: 86 (09/11 1031) Intake/Output from previous day:   Intake/Output from this shift:    Labs:  Recent Labs  08/09/13 0654  WBC 9.4  HGB 13.5  PLT 245  CREATININE 1.31   Estimated Creatinine Clearance: 117.6 ml/min (by C-G formula based on Cr of 1.31). No results found for this basename: VANCOTROUGH, VANCOPEAK, VANCORANDOM, GENTTROUGH, GENTPEAK, GENTRANDOM, TOBRATROUGH, TOBRAPEAK, TOBRARND, AMIKACINPEAK, AMIKACINTROU, AMIKACIN,  in the last 72 hours   Microbiology: No results found for this or any previous visit (from the past 720 hour(s)).  Medical History: Past Medical History  Diagnosis Date  . Hypertension   . Asthma   . Hypokalemia   . Diverticulitis   . Cellulitis     Medications:  Scheduled:  . sodium chloride   Intravenous STAT  . enoxaparin (LOVENOX) injection  40 mg Subcutaneous Q24H  . lisinopril  20 mg Oral Daily   And  . hydrochlorothiazide  25 mg Oral Daily  . niacin  250 mg Oral Daily  . pantoprazole  40 mg Oral Daily  . sodium chloride  3 mL Intravenous Q12H   Assessment: 42 yo obese M with RLE cellulitis that has worsened on outpatient therapy with Bactrim & Keflex.   Renal function is good, however Scr is slightly above previous baseline.   Goal of Therapy:  Vancomycin trough level 10-15 mcg/ml  Plan:  Vancomycin load then 1500mg  IV q12h Check Vancomycin trough at steady state Monitor renal function and cx data   Elson Clan 08/09/2013,11:53 AM

## 2013-08-09 NOTE — H&P (Addendum)
[facility] History and Physical  KAYA KLAUSING FAO:[id number] DOB: Jul 15, 1971 DOA: 08/09/2013  Referring physician: ED PCP: Colette Ribas, MD   Chief Complaint:  Right leg pain and swelling for one week  HPI:  42 y/o male with history of hypertension and tobacco use presented to the ED with symptoms of progressive erythema and swelling of right leg associated with pain . He came to the ED last week and was given antibiotic for cellulitis. Xray and doppler of rt leg were negative for abscess, osteomyelitis or DVT. Symptoms however did not improve and redness and swelling progressed down to his ankle and patient returned to the ED today. Also noticed an area of swelling over the right tibia. denies any drainage. He denies any fever, chills, headache, blurred vision, dizziness, nausea, vomiting, abdominal pain, bowel or urinary symptoms. Denies chest pain, palpitations, shortness of breath. Denies any trauma or recent travel. Denies history of blood clots. He does report history of gout in the past.   Review of Systems:  Constitutional: Denies fever, chills, diaphoresis, appetite change and fatigue.  HEENT: Denies photophobia, eye pain, redness, hearing loss, ear pain, congestion, sore throat, rhinorrhea, sneezing, mouth sores, trouble swallowing, neck pain, neck stiffness and tinnitus.   Respiratory: Denies SOB, DOE, cough, chest tightness,  and wheezing.   Cardiovascular: Denies chest pain, palpitations and leg swelling.  Gastrointestinal: Denies nausea, vomiting, abdominal pain, diarrhea, constipation, blood in stool and abdominal distention.  Genitourinary: Denies dysuria, urgency, frequency, hematuria, flank pain and difficulty urinating.  Musculoskeletal: Pain and swelling over right leg extending to the right ankle with area of fluctuation over the tibia  Skin: Denies pallor, rash and wound.  Neurological: Denies dizziness, seizures, syncope, weakness, light-headedness,  numbness and headaches.  Hematological: Denies adenopathy. Easy bruising, personal or family bleeding history    Past Medical History  Diagnosis Date  . Hypertension   . Asthma   . Hypokalemia   . Diverticulitis   . Cellulitis    Past Surgical History  Procedure Laterality Date  . Arm wound repair / closure     Social History:  reports that he has been smoking Cigarettes.  He has a 11 pack-year smoking history. He does not have any smokeless tobacco history on file. He reports that he does not drink alcohol or use illicit drugs.  Allergies  Allergen Reactions  . Penicillins Other (See Comments)    CHILDHOOD ALLERGY    History reviewed. No pertinent family history.  Prior to Admission medications   Medication Sig Start Date End Date Taking? Authorizing Provider  albuterol (PROVENTIL HFA;VENTOLIN HFA) 108 (90 BASE) MCG/ACT inhaler Inhale 2 puffs into the lungs every 6 (six) hours as needed for wheezing or shortness of breath.   Yes Historical Provider, MD  ALPRAZolam Prudy Feeler) 1 MG tablet Take 1 mg by mouth 4 (four) times daily.    Yes Historical Provider, MD  cephALEXin (KEFLEX) 500 MG capsule Take 1 capsule (500 mg total) by mouth 4 (four) times daily. 08/04/13  Yes Geoffery Lyons, MD  chlorproMAZINE (THORAZINE) 25 MG tablet Take 25 mg by mouth 4 (four) times daily. 08/02/13 08/16/13 Yes Historical Provider, MD  cholestyramine Lanetta Inch) 4 G packet Take 0.5 packets by mouth 2 (two) times daily.   Yes Historical Provider, MD  cyclobenzaprine (FLEXERIL) 10 MG tablet Take 1 tablet (10 mg total) by mouth 3 (three) times daily as needed for muscle spasms. 08/07/13  Yes Benny Lennert, MD  lisinopril-hydrochlorothiazide (PRINZIDE,ZESTORETIC) 20-25 MG per tablet  Take 1 tablet by mouth daily.   Yes Historical Provider, MD  niacin 250 MG CR capsule Take 250 mg by mouth daily.   Yes Historical Provider, MD  omeprazole (PRILOSEC) 40 MG capsule Take 40 mg by mouth daily.   Yes Historical Provider, MD   oxyCODONE-acetaminophen (PERCOCET) 10-325 MG per tablet Take 1 tablet by mouth every 4 (four) hours as needed for pain.   Yes Historical Provider, MD  sucralfate (CARAFATE) 1 GM/10ML suspension Take 1 g by mouth 4 (four) times daily.   Yes Historical Provider, MD  sulfamethoxazole-trimethoprim (SEPTRA DS) 800-160 MG per tablet Take 1 tablet by mouth every 12 (twelve) hours. 08/05/13  Yes Burgess Amor, PA-C  HYDROcodone-acetaminophen (NORCO) 5-325 MG per tablet Take 2 tablets by mouth every 4 (four) hours as needed for pain. 08/04/13   Geoffery Lyons, MD  HYDROcodone-acetaminophen (NORCO/VICODIN) 5-325 MG per tablet Take 1 tablet by mouth every 6 (six) hours as needed for pain. 08/07/13   Benny Lennert, MD    Physical Exam:  Filed Vitals:   08/09/13 0600 08/09/13 0733 08/09/13 0940 08/09/13 1031  BP: 150/83 122/72 124/72 136/89  Pulse: 102 87 84 86  Temp: 98.4 F (36.9 C)   97.6 F (36.4 C)  TempSrc: Oral   Oral  Resp: 20 22 22    Height: 6\' 3"  (1.905 m)   6\' 3"  (1.905 m)  Weight: 149.687 kg (330 lb)   150.503 kg (331 lb 12.8 oz)  SpO2: 98% 97% 96% 93%    Constitutional: Vital signs reviewed.  Patient is a well-developed and well-nourished in no acute distress and cooperative with exam. Alert and oriented x3.  HEENT: No pallor, no icterus, moist oral mucosa Cardiovascular: RRR, S1 normal, S2 normal, no MRG, pulses symmetric and intact bilaterally Pulmonary/Chest: CTAB, no wheezes, rales, or rhonchi Abdominal: Soft. Non-tender, non-distended, bowel sounds are normal, no masses, organomegaly, or guarding present.  Musculoskeletal:erythema with warmth and swelling  over the right leg extending from upper one third of the tibia down to the ankle with an area of tender fluctuant swelling over mid tibia measuring 3 x 4 cm. No discharge. tender to palpation over rt ankle.  Neurological: A&O x3, nonfocal  Labs on Admission:  Basic Metabolic Panel:  Recent Labs Lab 08/04/13 1730 08/09/13 0654   NA 136 136  K 3.4* 3.3*  CL 98 97  CO2 29 28  GLUCOSE 161* 154*  BUN 9 16  CREATININE 0.98 1.31  CALCIUM 9.7 9.8   Liver Function Tests:  Recent Labs Lab 08/09/13 0654  AST 17  ALT 16  ALKPHOS 87  BILITOT 0.3  PROT 7.6  ALBUMIN 3.5   No results found for this basename: LIPASE, AMYLASE,  in the last 168 hours No results found for this basename: AMMONIA,  in the last 168 hours CBC:  Recent Labs Lab 08/04/13 1730 08/09/13 0654  WBC 10.7* 9.4  NEUTROABS 7.5 6.4  HGB 13.6 13.5  HCT 38.9* 39.5  MCV 91.1 91.2  PLT 205 245   Cardiac Enzymes: No results found for this basename: CKTOTAL, CKMB, CKMBINDEX, TROPONINI,  in the last 168 hours BNP: No components found with this basename: POCBNP,  CBG: No results found for this basename: GLUCAP,  in the last 168 hours  Radiological Exams on Admission: Dg Tibia/fibula Right  08/09/2013   *RADIOLOGY REPORT*  Clinical Data: Cellulitis 1 week ago.  RIGHT TIBIA AND FIBULA - 2 VIEW  Comparison: None.  Findings: Irregularity of the tibia at  the interosseous ligament is often visualized as a normal finding.  No definitive plain film evidence of osteomyelitis.  If this were of high clinical concern MR imaging may be considered.  IMPRESSION: Irregularity of the tibia at the interosseous ligament is often visualized as a normal finding.  No definitive plain film evidence of osteomyelitis.  If this were of high clinical concern MR imaging may be considered.   Original Report Authenticated By: Lacy Duverney, M.D.     Assessment/Plan Principal Problem:   Cellulitis of right leg Patient given a dose of IV vancomycin in the ED. Mid to hospital given failed outpatient therapy PT. Will continue him on IV vancomycin. Given  fluctuation and tenderness over the mid tibia i will obtain a CT scan of the leg with contrast to rule out any underlying abscess.  Check uric acid level. Ruled out for DVT recently.  tylenol prn for fever  Active Problems:    Hypertension Resume home medication.  Hypokalemia Replenish with kcl  Tobacco use Counseled on smoking cessation  DVT prophylaxis: Subcutaneous Lovenox Diet: Low sodium  Code Status: Full code Family Communication: Wife at bedside Disposition Plan: Home once improved  Eddie [place] [facility] Pager  [phone]  If 7PM-7AM, please contact night-coverage [email or web address] Password [facility] 08/09/2013, 11:40 AM    Total time spent on admission: 70 minutes

## 2013-08-09 NOTE — ED Provider Notes (Addendum)
CSN: [id number]     Arrival date & time 08/09/13  0554 History   First MD Initiated Contact with Patient 08/09/13 [phone]     Chief Complaint  Patient presents with  . Cellulitis  . Leg Pain   (Consider location/radiation/quality/duration/timing/severity/associated sxs/prior Treatment) Patient is a 42 y.o. male presenting with leg pain. The history is provided by the patient and the spouse.  Leg Pain Associated symptoms: no back pain and no fever    patient seen August 6 diagnosis of right leg cellulitis started on Keflex. Patient seen August 9 Septra was added to the regimen. Patient had a Doppler study of the leg which was negative for DVT. Patient states that the cellulitis was improving and then got worse starting 2 days ago. Significantly worse today with increased pain and redness. No history of a wound to the leg. No history of similar problem. Patient has been taking Percocet for pain prescribed by his primary care Dr. Valera Castle care Dr. is Dr. Lamar Blinks office.  Past Medical History  Diagnosis Date  . Hypertension   . Asthma   . Hypokalemia   . Diverticulitis   . Cellulitis    Past Surgical History  Procedure Laterality Date  . Arm wound repair / closure     No family history on file. History  Substance Use Topics  . Smoking status: Current Every Day Smoker -- 0.50 packs/day    Types: Cigarettes  . Smokeless tobacco: Not on file  . Alcohol Use: No    Review of Systems  Constitutional: Negative for fever.  HENT: Negative for congestion.   Respiratory: Negative for shortness of breath.   Gastrointestinal: Negative for nausea, vomiting and abdominal pain.  Genitourinary: Negative for dysuria.  Musculoskeletal: Negative for back pain.  Skin: Positive for rash.  Neurological: Negative for headaches.  Hematological: Does not bruise/bleed easily.  Psychiatric/Behavioral: Negative for confusion.    Allergies  Penicillins  Home Medications   Current Outpatient Rx   Name  Route  Sig  Dispense  Refill  . albuterol (PROVENTIL HFA;VENTOLIN HFA) 108 (90 BASE) MCG/ACT inhaler   Inhalation   Inhale 2 puffs into the lungs every 6 (six) hours as needed for wheezing or shortness of breath.         . ALPRAZolam (XANAX) 1 MG tablet   Oral   Take 1 mg by mouth 4 (four) times daily.          . cephALEXin (KEFLEX) 500 MG capsule   Oral   Take 1 capsule (500 mg total) by mouth 4 (four) times daily.   40 capsule   0   . chlorproMAZINE (THORAZINE) 25 MG tablet   Oral   Take 25 mg by mouth 4 (four) times daily.         . cholestyramine (QUESTRAN) 4 G packet   Oral   Take 0.5 packets by mouth 2 (two) times daily.         . cyclobenzaprine (FLEXERIL) 10 MG tablet   Oral   Take 1 tablet (10 mg total) by mouth 3 (three) times daily as needed for muscle spasms.   30 tablet   0   . lisinopril-hydrochlorothiazide (PRINZIDE,ZESTORETIC) 20-25 MG per tablet   Oral   Take 1 tablet by mouth daily.         . niacin 250 MG CR capsule   Oral   Take 250 mg by mouth daily.         Marland Kitchen omeprazole (PRILOSEC) 40 MG  capsule   Oral   Take 40 mg by mouth daily.         Marland Kitchen oxyCODONE-acetaminophen (PERCOCET) 10-325 MG per tablet   Oral   Take 1 tablet by mouth every 4 (four) hours as needed for pain.         Marland Kitchen sucralfate (CARAFATE) 1 GM/10ML suspension   Oral   Take 1 g by mouth 4 (four) times daily.         Marland Kitchen sulfamethoxazole-trimethoprim (SEPTRA DS) 800-160 MG per tablet   Oral   Take 1 tablet by mouth every 12 (twelve) hours.   20 tablet   0   . HYDROcodone-acetaminophen (NORCO) 5-325 MG per tablet   Oral   Take 2 tablets by mouth every 4 (four) hours as needed for pain.   15 tablet   0   . HYDROcodone-acetaminophen (NORCO/VICODIN) 5-325 MG per tablet   Oral   Take 1 tablet by mouth every 6 (six) hours as needed for pain.   30 tablet   0    BP 122/72  Pulse 87  Temp(Src) 98.4 F (36.9 C) (Oral)  Resp 22  Ht 6\' 3"  (1.905 m)  Wt  330 lb (149.687 kg)  BMI 41.25 kg/m2  SpO2 97% Physical Exam  Nursing note and vitals reviewed. Constitutional: He is oriented to person, place, and time. He appears well-developed and well-nourished. No distress.  HENT:  Head: Normocephalic.  Mouth/Throat: Oropharynx is clear and moist.  Eyes: Conjunctivae and EOM are normal. Pupils are equal, round, and reactive to light.  Neck: Normal range of motion.  Cardiovascular: Normal rate, regular rhythm and intact distal pulses.   No murmur heard. Pulmonary/Chest: Effort normal and breath sounds normal.  Abdominal: Soft. Bowel sounds are normal. There is no tenderness.  Musculoskeletal: He exhibits edema and tenderness.  Left lower trimming these normal. Good cap refill. Right lower trimming he has marked cellulitis predominantly anteriorly from the knee down into the mid foot area. Good capillary refill to the right great toe 2 seconds. Long with the erythema is increased warmth. There is also able lying measuring about 3 x 3 cm anterior part of the shin on the right leg. No obvious wound.  Neurological: He is alert and oriented to person, place, and time. No cranial nerve deficit. He exhibits normal muscle tone. Coordination normal.  Skin: Skin is warm. There is erythema.    ED Course  Procedures (including critical care time) Labs Review Labs Reviewed  COMPREHENSIVE METABOLIC PANEL - Abnormal; Notable for the following:    Potassium 3.3 (*)    Glucose, Bld 154 (*)    GFR calc non Af Amer 67 (*)    GFR calc Af Amer 77 (*)    All other components within normal limits  URINALYSIS, ROUTINE W REFLEX MICROSCOPIC - Abnormal; Notable for the following:    Glucose, UA 100 (*)    Hgb urine dipstick MODERATE (*)    All other components within normal limits  CBC WITH DIFFERENTIAL  URINE MICROSCOPIC-ADD ON   Results for orders placed during the hospital encounter of 08/09/13  CBC WITH DIFFERENTIAL      Result Value Range   WBC 9.4  4.0 -  10.5 K/uL   RBC 4.33  4.22 - 5.81 MIL/uL   Hemoglobin 13.5  13.0 - 17.0 g/dL   HCT 16.1  09.6 - 04.5 %   MCV 91.2  78.0 - 100.0 fL   MCH 31.2  26.0 - 34.0 pg  MCHC 34.2  30.0 - 36.0 g/dL   RDW 16.1  09.6 - 04.5 %   Platelets 245  150 - 400 K/uL   Neutrophils Relative % 68  43 - 77 %   Neutro Abs 6.4  1.7 - 7.7 K/uL   Lymphocytes Relative 23  12 - 46 %   Lymphs Abs 2.1  0.7 - 4.0 K/uL   Monocytes Relative 7  3 - 12 %   Monocytes Absolute 0.6  0.1 - 1.0 K/uL   Eosinophils Relative 3  0 - 5 %   Eosinophils Absolute 0.3  0.0 - 0.7 K/uL   Basophils Relative 0  0 - 1 %   Basophils Absolute 0.0  0.0 - 0.1 K/uL   WBC Morphology ATYPICAL LYMPHOCYTES    COMPREHENSIVE METABOLIC PANEL      Result Value Range   Sodium 136  135 - 145 mEq/L   Potassium 3.3 (*) 3.5 - 5.1 mEq/L   Chloride 97  96 - 112 mEq/L   CO2 28  19 - 32 mEq/L   Glucose, Bld 154 (*) 70 - 99 mg/dL   BUN 16  6 - 23 mg/dL   Creatinine, Ser 4.09  0.50 - 1.35 mg/dL   Calcium 9.8  8.4 - 81.1 mg/dL   Total Protein 7.6  6.0 - 8.3 g/dL   Albumin 3.5  3.5 - 5.2 g/dL   AST 17  0 - 37 U/L   ALT 16  0 - 53 U/L   Alkaline Phosphatase 87  39 - 117 U/L   Total Bilirubin 0.3  0.3 - 1.2 mg/dL   GFR calc non Af Amer 67 (*) >90 mL/min   GFR calc Af Amer 77 (*) >90 mL/min  URINALYSIS, ROUTINE W REFLEX MICROSCOPIC      Result Value Range   Color, Urine YELLOW  YELLOW   APPearance CLEAR  CLEAR   Specific Gravity, Urine 1.020  1.005 - 1.030   pH 5.5  5.0 - 8.0   Glucose, UA 100 (*) NEGATIVE mg/dL   Hgb urine dipstick MODERATE (*) NEGATIVE   Bilirubin Urine NEGATIVE  NEGATIVE   Ketones, ur NEGATIVE  NEGATIVE mg/dL   Protein, ur NEGATIVE  NEGATIVE mg/dL   Urobilinogen, UA 1.0  0.0 - 1.0 mg/dL   Nitrite NEGATIVE  NEGATIVE   Leukocytes, UA NEGATIVE  NEGATIVE  URINE MICROSCOPIC-ADD ON      Result Value Range   RBC / HPF 11-20  <3 RBC/hpf    Imaging Review Dg Lumbar Spine Complete  08/07/2013   *RADIOLOGY REPORT*  Clinical Data: Low  back pain post lifting injury  LUMBAR SPINE - COMPLETE 4+ VIEW  Comparison: CT abdomen pelvis - 07/05/2012  Findings:  There are five non-rib bearing lumbar type vertebral bodies.  Normal alignment of the lumbar spine.  No anterolisthesis or retrolisthesis.  No definite pars defects.  Lumbar vertebral body heights are preserved.  Redemonstrated exuberant syndesmotic formation about the anterior aspect of the L3 - L4 intervertebral disc space as demonstrated on prior abdominal CT.  Intervertebral disc spaces are preserved.  Limited visualization of bilateral SI joints are normal.  Moderate to large colonic stool burden.  Regional soft tissues otherwise normal.  IMPRESSION: No acute findings.   Original Report Authenticated By: Tacey Ruiz, MD   Dg Tibia/fibula Right  08/09/2013   *RADIOLOGY REPORT*  Clinical Data: Cellulitis 1 week ago.  RIGHT TIBIA AND FIBULA - 2 VIEW  Comparison: None.  Findings: Irregularity of  the tibia at the interosseous ligament is often visualized as a normal finding.  No definitive plain film evidence of osteomyelitis.  If this were of high clinical concern MR imaging may be considered.  IMPRESSION: Irregularity of the tibia at the interosseous ligament is often visualized as a normal finding.  No definitive plain film evidence of osteomyelitis.  If this were of high clinical concern MR imaging may be considered.   Original Report Authenticated By: Lacy Duverney, M.D.    MDM   1. Cellulitis    Patient with failure on the oral antibiotics Keflex and Septra for right leg cellulitis. Got worse 2 days ago. Patient started antibiotics on September 6 second antibiotic was added on September 9. Patient will require admission. Labs during previous ED checks without significant abnormalities. Patient with an area of the line on the one leg do not feel that it is an abscess it measures 3 x 3 cm soft no significant induration no discharge. Marked cellulitis to the anterior part of the leg  from the mid foot up to the knee. We'll discuss with hospitalist regarding admission.    Shelda Jakes, MD 08/09/13 1610  Shelda Jakes, MD 08/09/13 [phone]

## 2013-08-10 DIAGNOSIS — M109 Gout, unspecified: Secondary | ICD-10-CM | POA: Diagnosis present

## 2013-08-10 DIAGNOSIS — F17200 Nicotine dependence, unspecified, uncomplicated: Secondary | ICD-10-CM

## 2013-08-10 MED ORDER — NAPROXEN 250 MG PO TABS
500.0000 mg | ORAL_TABLET | Freq: Two times a day (BID) | ORAL | Status: DC
  Administered 2013-08-10 – 2013-08-11 (×2): 500 mg via ORAL
  Filled 2013-08-10 (×2): qty 2

## 2013-08-10 NOTE — Progress Notes (Signed)
Patient had one time order for NS @ 100 ml/hr.  Pt also requesting to be disconnected from IV and leave floor in wheelchair.  Dr. Gonzella Lex paged.  Dr. Gonzella Lex returned page and gave order for patient's IV fluids to be disconnected.  Dr. Gonzella Lex stated that patient should not leave the floor.  He needs to keep his leg elevated.  Patient educated about not leaving the floor and elevated right lower extremity.

## 2013-08-10 NOTE — Progress Notes (Signed)
[facility] PROGRESS NOTE  Ethan [name] YNW:[id number] DOB: 1971-05-20 DOA: 08/09/2013 PCP: Colette Ribas, MD   Brief narrative 42 y/o obese male admitted for cellulitis of RLE with failed outpt abx. MRI of leg done concerned for abscess over the tibia suggests an abscess.  Assessment/Plan: RLE cellulitis with abscess On IV vancomycin. clinically has a fluctuant area over rt tibia anteriorly. MRI suggests abscess. Surgery consulted.  Remains afebrile.    Rt ankle pain  elevated uric acid. Reports hx of gout. Will treat with naproxen 500 mg bid.  HTN  stable  Prediabetes A1C of 5.8. counseled on diet restriction and exercise to lose weight.  Code Status: full Family Communication: wife at bedside Disposition Plan: home once improved   Consultants:  Surgery consulted  Procedures:  none  Antibiotics:  IV vanco (9/11>>)  HPI/Subjective: Still has pain and erythema over  rt leg  Objective: Filed Vitals:   08/10/13 0450  BP: 107/58  Pulse: 70  Temp: 97.1 F (36.2 C)  Resp: 20    Intake/Output Summary (Last 24 hours) at 08/10/13 0953 Last data filed at 08/10/13 0001  Gross per 24 hour  Intake 1813.33 ml  Output   1950 ml  Net -136.67 ml   Filed Weights   08/09/13 0600 08/09/13 1031  Weight: 149.687 kg (330 lb) 150.503 kg (331 lb 12.8 oz)    Exam: Patient is a well-developed and well-nourished in no acute distress and cooperative with exam.  HEENT: No pallor, no icterus, moist oral mucosa  Cardiovascular: RRR, S1 normal, S2 normal, no MRG, pulses symmetric and intact bilaterally  Pulmonary/Chest: CTAB, no wheezes, rales, or rhonchi  Abdominal: Soft. Non-tender, non-distended, bowel sounds are normal, no masses, organomegaly, or guarding present.  Musculoskeletal:erythema with warmth and swelling over the right leg extending from upper one third of the tibia down to the ankle with an area of tender fluctuant swelling over mid tibia  measuring 3 x 4 cm. No discharge. tender to palpation over rt ankle.  Neurological: A&O x3, nonfocal   Data Reviewed: Basic Metabolic Panel:  Recent Labs Lab 08/04/13 1730 08/09/13 0654  NA 136 136  K 3.4* 3.3*  CL 98 97  CO2 29 28  GLUCOSE 161* 154*  BUN 9 16  CREATININE 0.98 1.31  CALCIUM 9.7 9.8   Liver Function Tests:  Recent Labs Lab 08/09/13 0654  AST 17  ALT 16  ALKPHOS 87  BILITOT 0.3  PROT 7.6  ALBUMIN 3.5   No results found for this basename: LIPASE, AMYLASE,  in the last 168 hours No results found for this basename: AMMONIA,  in the last 168 hours CBC:  Recent Labs Lab 08/04/13 1730 08/09/13 0654  WBC 10.7* 9.4  NEUTROABS 7.5 6.4  HGB 13.6 13.5  HCT 38.9* 39.5  MCV 91.1 91.2  PLT 205 245   Cardiac Enzymes: No results found for this basename: CKTOTAL, CKMB, CKMBINDEX, TROPONINI,  in the last 168 hours BNP (last 3 results) No results found for this basename: PROBNP,  in the last 8760 hours CBG: No results found for this basename: GLUCAP,  in the last 168 hours  No results found for this or any previous visit (from the past 240 hour(s)).   Studies: Dg Tibia/fibula Right  08/09/2013   *RADIOLOGY REPORT*  Clinical Data: Cellulitis 1 week ago.  RIGHT TIBIA AND FIBULA - 2 VIEW  Comparison: None.  Findings: Irregularity of the tibia at the interosseous ligament is often visualized as a normal  finding.  No definitive plain film evidence of osteomyelitis.  If this were of high clinical concern MR imaging may be considered.  IMPRESSION: Irregularity of the tibia at the interosseous ligament is often visualized as a normal finding.  No definitive plain film evidence of osteomyelitis.  If this were of high clinical concern MR imaging may be considered.   Original Report Authenticated By: Lacy Duverney, M.D.   Mr Tibia Fibula Right W Wo Contrast  08/10/2013   CLINICAL DATA:  Erythema, pain, and swelling in the right lower leg.  EXAM: MRI OF LOWER RIGHT  EXTREMITY WITHOUT AND WITH CONTRAST  TECHNIQUE: Multiplanar, multisequence MR imaging of the lower right extremity was performed both before and after administration of intravenous contrast.  CONTRAST:  20mL MULTIHANCE GADOBENATE DIMEGLUMINE 529 MG/ML IV SOLN  COMPARISON:  08/09/2013 ; 04/03/2010  FINDINGS: An anterior lower leg subcutaneous lesion along the anteromedial margin of the tibia at the level of the junction of the mid and distal thirds of the tibia measures 2.2 x 2.9 by 3.0 cm and has high T2 and intermediate T1 signal characteristics. There is diffuse subcutaneous edema in the lower leg including around this lesion, with a thin rim of enhancement.  Diffuse subcutaneous edema from the knee extending into the foot especially anteriorly. Down near the ankle there is a small amount of enhancement tracking around the distal myotendinous junction and tendon of the tibialis posterior. This is along the marginal fascia plane rather than involving the muscle itself.  IMPRESSION: 1. Abnormal subcutaneous lesion along the anteromedial tibia at the junction of the mid and distal thirds is likely very apparent on physical examination and is probably an abscess. 2. Cellulitis extending from the knee through the ankle. Equivocal distal fasciitis along the margins of the tibialis posterior. No osteomyelitis identified.   Electronically Signed   By: Herbie Baltimore   On: 08/10/2013 08:17    Scheduled Meds: . ALPRAZolam  1 mg Oral QID  . enoxaparin (LOVENOX) injection  40 mg Subcutaneous Q24H  . lisinopril  20 mg Oral Daily   And  . hydrochlorothiazide  25 mg Oral Daily  . niacin  250 mg Oral Daily  . pantoprazole  40 mg Oral Daily  . sodium chloride  3 mL Intravenous Q12H  . vancomycin  1,500 mg Intravenous Q12H   Continuous Infusions: . sodium chloride 100 mL/hr at 08/10/13 0004      Time spent: 25 minutes    Justis Dupas  [facility] Pager [phone]. If 7PM-7AM, please contact  night-coverage at [email or web address], password [facility] 08/10/2013, 9:53 AM  LOS: 1 day

## 2013-08-10 NOTE — Consult Note (Signed)
Reason for Consult: Abscess, right leg Referring Physician: [facility]  Ethan [name] is an 42 y.o. male.  HPI: Patient is a 42 year old black male who was started on oral antibiotics for cellulitis of the pretibial region and right foot. He presented back to the hospital do to worsening swelling of the right ankle as well as the formation of a pretibial fluctuant mass. This was confirmed to be an abscess of the subcutaneous tissue by MRI.  Past Medical History  Diagnosis Date  . Hypertension   . Asthma   . Hypokalemia   . Diverticulitis   . Cellulitis     Past Surgical History  Procedure Laterality Date  . Arm wound repair / closure      History reviewed. No pertinent family history.  Social History:  reports that he has been smoking Cigarettes.  He has a 11 pack-year smoking history. He does not have any smokeless tobacco history on file. He reports that he does not drink alcohol or use illicit drugs.  Allergies:  Allergies  Allergen Reactions  . Penicillins Other (See Comments)    CHILDHOOD ALLERGY    Medications: I have reviewed the patient's current medications.  Results for orders placed during the hospital encounter of 08/09/13 (from the past 48 hour(s))  URINALYSIS, ROUTINE W REFLEX MICROSCOPIC     Status: Abnormal   Collection Time    08/09/13  6:44 AM      Result Value Range   Color, Urine YELLOW  YELLOW   APPearance CLEAR  CLEAR   Specific Gravity, Urine 1.020  1.005 - 1.030   pH 5.5  5.0 - 8.0   Glucose, UA 100 (*) NEGATIVE mg/dL   Hgb urine dipstick MODERATE (*) NEGATIVE   Bilirubin Urine NEGATIVE  NEGATIVE   Ketones, ur NEGATIVE  NEGATIVE mg/dL   Protein, ur NEGATIVE  NEGATIVE mg/dL   Urobilinogen, UA 1.0  0.0 - 1.0 mg/dL   Nitrite NEGATIVE  NEGATIVE   Leukocytes, UA NEGATIVE  NEGATIVE  URINE MICROSCOPIC-ADD ON     Status: None   Collection Time    08/09/13  6:44 AM      Result Value Range   RBC / HPF 11-20  <3 RBC/hpf  CBC WITH  DIFFERENTIAL     Status: None   Collection Time    08/09/13  6:54 AM      Result Value Range   WBC 9.4  4.0 - 10.5 K/uL   RBC 4.33  4.22 - 5.81 MIL/uL   Hemoglobin 13.5  13.0 - 17.0 g/dL   HCT 16.1  09.6 - 04.5 %   MCV 91.2  78.0 - 100.0 fL   MCH 31.2  26.0 - 34.0 pg   MCHC 34.2  30.0 - 36.0 g/dL   RDW 40.9  81.1 - 91.4 %   Platelets 245  150 - 400 K/uL   Neutrophils Relative % 68  43 - 77 %   Neutro Abs 6.4  1.7 - 7.7 K/uL   Lymphocytes Relative 23  12 - 46 %   Lymphs Abs 2.1  0.7 - 4.0 K/uL   Monocytes Relative 7  3 - 12 %   Monocytes Absolute 0.6  0.1 - 1.0 K/uL   Eosinophils Relative 3  0 - 5 %   Eosinophils Absolute 0.3  0.0 - 0.7 K/uL   Basophils Relative 0  0 - 1 %   Basophils Absolute 0.0  0.0 - 0.1 K/uL   WBC Morphology ATYPICAL LYMPHOCYTES  COMPREHENSIVE METABOLIC PANEL     Status: Abnormal   Collection Time    08/09/13  6:54 AM      Result Value Range   Sodium 136  135 - 145 mEq/L   Potassium 3.3 (*) 3.5 - 5.1 mEq/L   Chloride 97  96 - 112 mEq/L   CO2 28  19 - 32 mEq/L   Glucose, Bld 154 (*) 70 - 99 mg/dL   BUN 16  6 - 23 mg/dL   Creatinine, Ser 1.61  0.50 - 1.35 mg/dL   Calcium 9.8  8.4 - 09.6 mg/dL   Total Protein 7.6  6.0 - 8.3 g/dL   Albumin 3.5  3.5 - 5.2 g/dL   AST 17  0 - 37 U/L   ALT 16  0 - 53 U/L   Alkaline Phosphatase 87  39 - 117 U/L   Total Bilirubin 0.3  0.3 - 1.2 mg/dL   GFR calc non Af Amer 67 (*) >90 mL/min   GFR calc Af Amer 77 (*) >90 mL/min   Comment: (NOTE)     The eGFR has been calculated using the CKD EPI equation.     This calculation has not been validated in all clinical situations.     eGFR's persistently <90 mL/min signify possible Chronic Kidney     Disease.  HEMOGLOBIN A1C     Status: Abnormal   Collection Time    08/09/13 12:30 PM      Result Value Range   Hemoglobin A1C 6.3 (*) <5.7 %   Comment: (NOTE)                                                                               According to the ADA Clinical Practice  Recommendations for 2011, when     HbA1c is used as a screening test:      >=6.5%   Diagnostic of Diabetes Mellitus               (if abnormal result is confirmed)     5.7-6.4%   Increased risk of developing Diabetes Mellitus     References:Diagnosis and Classification of Diabetes Mellitus,Diabetes     Care,2011,34(Suppl 1):S62-S69 and Standards of Medical Care in             Diabetes - 2011,Diabetes Care,2011,34 (Suppl 1):S11-S61.   Mean Plasma Glucose 134 (*) <117 mg/dL   Comment: Performed at [facility]  URIC ACID     Status: Abnormal   Collection Time    08/09/13 12:30 PM      Result Value Range   Uric Acid, Serum 8.6 (*) 4.0 - 7.8 mg/dL    Dg Tibia/fibula Right  08/09/2013   *RADIOLOGY REPORT*  Clinical Data: Cellulitis 1 week ago.  RIGHT TIBIA AND FIBULA - 2 VIEW  Comparison: None.  Findings: Irregularity of the tibia at the interosseous ligament is often visualized as a normal finding.  No definitive plain film evidence of osteomyelitis.  If this were of high clinical concern MR imaging may be considered.  IMPRESSION: Irregularity of the tibia at the interosseous ligament is often visualized as a normal finding.  No definitive plain film evidence of osteomyelitis.  If this were of high clinical concern MR imaging may be considered.   Original Report Authenticated By: Lacy Duverney, M.D.   Mr Tibia Fibula Right W Wo Contrast  08/10/2013   CLINICAL DATA:  Erythema, pain, and swelling in the right lower leg.  EXAM: MRI OF LOWER RIGHT EXTREMITY WITHOUT AND WITH CONTRAST  TECHNIQUE: Multiplanar, multisequence MR imaging of the lower right extremity was performed both before and after administration of intravenous contrast.  CONTRAST:  20mL MULTIHANCE GADOBENATE DIMEGLUMINE 529 MG/ML IV SOLN  COMPARISON:  08/09/2013 ; 04/03/2010  FINDINGS: An anterior lower leg subcutaneous lesion along the anteromedial margin of the tibia at the level of the junction of the mid and distal thirds of  the tibia measures 2.2 x 2.9 by 3.0 cm and has high T2 and intermediate T1 signal characteristics. There is diffuse subcutaneous edema in the lower leg including around this lesion, with a thin rim of enhancement.  Diffuse subcutaneous edema from the knee extending into the foot especially anteriorly. Down near the ankle there is a small amount of enhancement tracking around the distal myotendinous junction and tendon of the tibialis posterior. This is along the marginal fascia plane rather than involving the muscle itself.  IMPRESSION: 1. Abnormal subcutaneous lesion along the anteromedial tibia at the junction of the mid and distal thirds is likely very apparent on physical examination and is probably an abscess. 2. Cellulitis extending from the knee through the ankle. Equivocal distal fasciitis along the margins of the tibialis posterior. No osteomyelitis identified.   Electronically Signed   By: Herbie Baltimore   On: 08/10/2013 08:17    ROS: see chart  Blood pressure 136/85, pulse 78, temperature 98 F (36.7 C), temperature source Oral, resp. rate 18, height 6\' 3"  (1.905 m), weight 150.503 kg (331 lb 12.8 oz), SpO2 98.00%. Physical Exam: Pleasant black male in no acute distress. Extremity examination reveals a 3-4 cm fluctuant mass in the mid pretibial region. The right ankle is diffusely swollen somewhat erythematous. I was able to palpate a faint dorsalis pedis pulse. I cannot feel a posterior tibial pulse.  Procedure: Informed consent obtained from the patient. The right pretibial region was prepped with Betadine. An incision was made over the abscess. Purulent fluid was found. A culture of this was taken and sent for microbiology. The fluid was expressed from the wound. A dressing with Ace wrap was then applied from the foot to the wound area. The patient tolerated the procedure well.  Assessment/Plan: Impression: Abscess, right pretibial region Plan: Continue current antibiotic therapy. Will  reevaluate the wound in a.m.  Tymeka Privette A 08/10/2013, 3:02 PM

## 2013-08-10 NOTE — Progress Notes (Signed)
UR Chart Review Completed  

## 2013-08-11 DIAGNOSIS — L02419 Cutaneous abscess of limb, unspecified: Principal | ICD-10-CM | POA: Diagnosis present

## 2013-08-11 DIAGNOSIS — M109 Gout, unspecified: Secondary | ICD-10-CM

## 2013-08-11 LAB — BASIC METABOLIC PANEL
BUN: 10 mg/dL (ref 6–23)
CO2: 29 meq/L (ref 19–32)
Calcium: 9.4 mg/dL (ref 8.4–10.5)
Chloride: 102 meq/L (ref 96–112)
Creatinine, Ser: 0.9 mg/dL (ref 0.50–1.35)
GFR calc Af Amer: >90 mL/min (ref >90)
GFR calc non Af Amer: >90 mL/min (ref >90)
Glucose, Bld: 120 mg/dL — ABNORMAL HIGH (ref 70–99)
Potassium: 4 meq/L (ref 3.5–5.1)
Sodium: 139 meq/L (ref 135–145)

## 2013-08-11 MED ORDER — SULFAMETHOXAZOLE-TRIMETHOPRIM 800-160 MG PO TABS: 1.0000 | ORAL_TABLET | Freq: Two times a day (BID) | ORAL | Status: DC

## 2013-08-11 MED ORDER — NAPROXEN 500 MG PO TABS: 500.0000 mg | ORAL_TABLET | Freq: Two times a day (BID) | ORAL | Status: DC

## 2013-08-11 NOTE — Progress Notes (Signed)
Subjective: No significant pain. Has been ambulating.  Objective: Vital signs in last 24 hours: Temp:  [98 F (36.7 C)-98.6 F (37 C)] 98.2 F (36.8 C) (09/13 0544) Pulse Rate:  [76-89] 76 (09/13 0544) Resp:  [18-20] 20 (09/13 0544) BP: (136-145)/(85-95) 145/88 mmHg (09/13 0544) SpO2:  [95 %-98 %] 96 % (09/13 0544) Last BM Date: 08/08/13  Intake/Output from previous day: 09/12 0701 - 09/13 0700 In: 1080 [P.O.:1080] Out: 1750 [Urine:1750] Intake/Output this shift:    General appearance: alert, cooperative and no distress Extremities: Pretibial wound with some serosanguineous drainage present. Decreased erythema present. Some induration present circumferentially. Right ankle still swollen secondary to gout.  Lab Results:   Recent Labs  08/09/13 0654  WBC 9.4  HGB 13.5  HCT 39.5  PLT 245   BMET  Recent Labs  08/09/13 0654 08/11/13 0550  NA 136 139  K 3.3* 4.0  CL 97 102  CO2 28 29  GLUCOSE 154* 120*  BUN 16 10  CREATININE 1.31 0.90  CALCIUM 9.8 9.4   PT/INR No results found for this basename: LABPROT, INR,  in the last 72 hours  Studies/Results: Mr Tibia Fibula Right W Wo Contrast  08/10/2013   CLINICAL DATA:  Erythema, pain, and swelling in the right lower leg.  EXAM: MRI OF LOWER RIGHT EXTREMITY WITHOUT AND WITH CONTRAST  TECHNIQUE: Multiplanar, multisequence MR imaging of the lower right extremity was performed both before and after administration of intravenous contrast.  CONTRAST:  20mL MULTIHANCE GADOBENATE DIMEGLUMINE 529 MG/ML IV SOLN  COMPARISON:  08/09/2013 ; 04/03/2010  FINDINGS: An anterior lower leg subcutaneous lesion along the anteromedial margin of the tibia at the level of the junction of the mid and distal thirds of the tibia measures 2.2 x 2.9 by 3.0 cm and has high T2 and intermediate T1 signal characteristics. There is diffuse subcutaneous edema in the lower leg including around this lesion, with a thin rim of enhancement.  Diffuse  subcutaneous edema from the knee extending into the foot especially anteriorly. Down near the ankle there is a small amount of enhancement tracking around the distal myotendinous junction and tendon of the tibialis posterior. This is along the marginal fascia plane rather than involving the muscle itself.  IMPRESSION: 1. Abnormal subcutaneous lesion along the anteromedial tibia at the junction of the mid and distal thirds is likely very apparent on physical examination and is probably an abscess. 2. Cellulitis extending from the knee through the ankle. Equivocal distal fasciitis along the margins of the tibialis posterior. No osteomyelitis identified.   Electronically Signed   By: Herbie Baltimore   On: 08/10/2013 08:17    Anti-infectives: Anti-infectives   Start     Dose/Rate Route Frequency Ordered Stop   08/10/13 0100  vancomycin (VANCOCIN) 1,500 mg in sodium chloride 0.9 % 500 mL IVPB     1,500 mg 250 mL/hr over 120 Minutes Intravenous Every 12 hours 08/09/13 1200     08/09/13 1300  vancomycin (VANCOCIN) 2,000 mg in sodium chloride 0.9 % 500 mL IVPB     2,000 mg 250 mL/hr over 120 Minutes Intravenous  Once 08/09/13 1200 08/09/13 1436   08/09/13 0645  vancomycin (VANCOCIN) IVPB 1000 mg/200 mL premix     1,000 mg 200 mL/hr over 60 Minutes Intravenous  Once 08/09/13 0637 08/09/13 0800      Assessment/Plan: Impression: Abscess, right lower extremity. This is unrelated to his right ankle swelling that secondary to gout. Plan: Agree with discharge. Patient has been  instructed on wound care. Agree with ten-day course of by mouth Bactrim. Will see patient in my office next week.  LOS: 2 days    Dorothyann Mourer A 08/11/2013

## 2013-08-11 NOTE — Progress Notes (Signed)
AVS reviewed with patient.  Pt verbalized understanding of discharge of discharge instructions, physician follow-up and prescriptions.  Prescriptions provided to patient.  Wound care supplies provided to patient.  Dr. Lovell Sheehan reviewed wound care with patient this morning.  Pt's IV removed.  Site WNL.  Pt reports all belongings intact and in possession.  Pt refused wheelchair.  NT walked with pt to main entrance for discharge.  Pt stable at time of discharge.

## 2013-08-11 NOTE — Discharge Summary (Signed)
Physician Discharge Summary  Ethan [name] ZOX:[id number] DOB: 1971/04/28 DOA: 08/09/2013  PCP: Colette Ribas, MD  Admit date: 08/09/2013 Discharge date: 08/11/2013  Time spent: 40 minutes  Recommendations for Outpatient Follow-up:  1. Home with otpt follow up with Dr Lovell Sheehan in 2 days  Discharge Diagnoses:  Principal Problem:   Cellulitis and abscess of leg, except foot  Active Problems:   Acute gouty arthritis   Hypertension   Hypokalemia   Tobacco use disorder    prediabetes   Discharge Condition: fair  Diet recommendation: low sodium  Filed Weights   08/09/13 0600 08/09/13 1031  Weight: 149.687 kg (330 lb) 150.503 kg (331 lb 12.8 oz)    History of present illness:  Please refer to admission H&P for details , but in brief, 42 y/o obese male admitted for cellulitis of RLE with failed outpt abx. MRI of leg done concerned for abscess over the tibia suggests an abscess.      Hospital Course:  Assessment/Plan:  RLE cellulitis with abscess  Placed on IV vancomycin. clinically has a fluctuant area over rt tibia anteriorly. MRI suggests abscess. Surgery consulted and had I&D done at bedside  Remains afebrile and improving clinically. Applied dressing over wound and can be discharged home with 10 days of oral bactrim. Wound cx negative for growth so far. Follow up with Dr Lovell Sheehan as outpt. .  Rt ankle pain  elevated uric acid. Reports hx of gout. Will treat with naproxen 500 mg bid for 5 days.  HTN  stable . Resumed home meds  Prediabetes  A1C of 5.8. counseled on diet restriction and exercise to lose weight.   Code Status: full  Family Communication: wife at bedside   Procedures:  Bedside I&D right leg abscess on 9/12  Consultations:  General surgery  Discharge Exam: Filed Vitals:   08/11/13 0943  BP: 131/79  Pulse: 92  Temp: 98.7 F (37.1 C)  Resp:     Patient is a well-developed and well-nourished in no acute distress and cooperative with  exam.  HEENT: No pallor, no icterus, moist oral mucosa  Cardiovascular: RRR, S1 normal, S2 normal, no MRG, pulses symmetric and intact bilaterally  Pulmonary/Chest: CTAB, no wheezes, rales, or rhonchi  Abdominal: Soft. Non-tender, non-distended, bowel sounds are normal, no masses, organomegaly, or guarding present.  Musculoskeletal:rt leg erythema and swelling improved. Pressure over the drained abscess wound released small blood stained discharge. Non tender  Neurological: A&O x3, nonfocal   Discharge Instructions   Future Appointments Provider Department Dept Phone   08/16/2013 8:30 AM Nira Retort, NP [facility] [phone]       Medication List    STOP taking these medications       cephALEXin 500 MG capsule  Commonly known as:  KEFLEX     oxyCODONE-acetaminophen 10-325 MG per tablet  Commonly known as:  PERCOCET      TAKE these medications       albuterol 108 (90 BASE) MCG/ACT inhaler  Commonly known as:  PROVENTIL HFA;VENTOLIN HFA  Inhale 2 puffs into the lungs every 6 (six) hours as needed for wheezing or shortness of breath.     ALPRAZolam 1 MG tablet  Commonly known as:  XANAX  Take 1 mg by mouth 4 (four) times daily.     chlorproMAZINE 25 MG tablet  Commonly known as:  THORAZINE  Take 25 mg by mouth 4 (four) times daily.     cholestyramine 4 G packet  Commonly known as:  QUESTRAN  Take 0.5 packets by mouth 2 (two) times daily.     cyclobenzaprine 10 MG tablet  Commonly known as:  FLEXERIL  Take 1 tablet (10 mg total) by mouth 3 (three) times daily as needed for muscle spasms.     HYDROcodone-acetaminophen 5-325 MG per tablet  Commonly known as:  NORCO/VICODIN  Take 1 tablet by mouth every 6 (six) hours as needed for pain.     lisinopril-hydrochlorothiazide 20-25 MG per tablet  Commonly known as:  PRINZIDE,ZESTORETIC  Take 1 tablet by mouth daily.     naproxen 500 MG tablet  Commonly known as:  NAPROSYN  Take 1 tablet (500  mg total) by mouth 2 (two) times daily with a meal.     niacin 250 MG CR capsule  Take 250 mg by mouth daily.     omeprazole 40 MG capsule  Commonly known as:  PRILOSEC  Take 40 mg by mouth daily.     sucralfate 1 GM/10ML suspension  Commonly known as:  CARAFATE  Take 1 g by mouth 4 (four) times daily.     sulfamethoxazole-trimethoprim 800-160 MG per tablet  Commonly known as:  SEPTRA DS  Take 1 tablet by mouth every 12 (twelve) hours. Until 08/21/2013       Allergies  Allergen Reactions  . Penicillins Other (See Comments)    CHILDHOOD ALLERGY       Follow-up Information   Follow up with Dalia Heading, MD. Schedule an appointment as soon as possible for a visit on 08/14/2013.   Specialty:  General Surgery   Contact information:   [address] [place] [address] [phone]       Follow up with Colette Ribas, MD In 1 week.   Specialty:  Family Medicine   Contact information:   [address] [place] [address] [phone]        The results of significant diagnostics from this hospitalization (including imaging, microbiology, ancillary and laboratory) are listed below for reference.    Significant Diagnostic Studies: Dg Lumbar Spine Complete  08/07/2013   *RADIOLOGY REPORT*  Clinical Data: Low back pain post lifting injury  LUMBAR SPINE - COMPLETE 4+ VIEW  Comparison: CT abdomen pelvis - 07/05/2012  Findings:  There are five non-rib bearing lumbar type vertebral bodies.  Normal alignment of the lumbar spine.  No anterolisthesis or retrolisthesis.  No definite pars defects.  Lumbar vertebral body heights are preserved.  Redemonstrated exuberant syndesmotic formation about the anterior aspect of the L3 - L4 intervertebral disc space as demonstrated on prior abdominal CT.  Intervertebral disc spaces are preserved.  Limited visualization of bilateral SI joints are normal.  Moderate to large colonic stool burden.  Regional soft  tissues otherwise normal.  IMPRESSION: No acute findings.   Original Report Authenticated By: Tacey Ruiz, MD   Dg Tibia/fibula Right  08/09/2013   *RADIOLOGY REPORT*  Clinical Data: Cellulitis 1 week ago.  RIGHT TIBIA AND FIBULA - 2 VIEW  Comparison: None.  Findings: Irregularity of the tibia at the interosseous ligament is often visualized as a normal finding.  No definitive plain film evidence of osteomyelitis.  If this were of high clinical concern MR imaging may be considered.  IMPRESSION: Irregularity of the tibia at the interosseous ligament is often visualized as a normal finding.  No definitive plain film evidence of osteomyelitis.  If this were of high clinical concern MR imaging may be considered.   Original Report Authenticated  By: Lacy Duverney, M.D.   Mr Tibia Fibula Right W Wo Contrast  08/10/2013   CLINICAL DATA:  Erythema, pain, and swelling in the right lower leg.  EXAM: MRI OF LOWER RIGHT EXTREMITY WITHOUT AND WITH CONTRAST  TECHNIQUE: Multiplanar, multisequence MR imaging of the lower right extremity was performed both before and after administration of intravenous contrast.  CONTRAST:  20mL MULTIHANCE GADOBENATE DIMEGLUMINE 529 MG/ML IV SOLN  COMPARISON:  08/09/2013 ; 04/03/2010  FINDINGS: An anterior lower leg subcutaneous lesion along the anteromedial margin of the tibia at the level of the junction of the mid and distal thirds of the tibia measures 2.2 x 2.9 by 3.0 cm and has high T2 and intermediate T1 signal characteristics. There is diffuse subcutaneous edema in the lower leg including around this lesion, with a thin rim of enhancement.  Diffuse subcutaneous edema from the knee extending into the foot especially anteriorly. Down near the ankle there is a small amount of enhancement tracking around the distal myotendinous junction and tendon of the tibialis posterior. This is along the marginal fascia plane rather than involving the muscle itself.  IMPRESSION: 1. Abnormal subcutaneous  lesion along the anteromedial tibia at the junction of the mid and distal thirds is likely very apparent on physical examination and is probably an abscess. 2. Cellulitis extending from the knee through the ankle. Equivocal distal fasciitis along the margins of the tibialis posterior. No osteomyelitis identified.   Electronically Signed   By: Herbie Baltimore   On: 08/10/2013 08:17   US Venous Img Lower Unilateral Right  08/05/2013   *RADIOLOGY REPORT*  Clinical Data: Right leg pain.  RIGHT LOWER EXTREMITY VENOUS DUPLEX ULTRASOUND  Technique:  Gray-scale sonography with graded compression, as well as color Doppler and duplex ultrasound were performed to evaluate the deep venous system of the lower extremity from the level of the common femoral vein through the popliteal and proximal calf veins. Spectral Doppler was utilized to evaluate flow at rest and with distal augmentation maneuvers.  Comparison:  None.  Findings:  Normal compressibility of the common femoral, superficial femoral, and popliteal veins is demonstrated, as well as the visualized proximal calf veins.  No filling defects to suggest DVT on grayscale or color Doppler imaging.  Doppler waveforms show normal direction of venous flow, normal respiratory phasicity and response to augmentation. Subcutaneous edema incidentally noted.  IMPRESSION: No evidence of right lower extremity deep venous thrombosis.  Diffuse subcutaneous edema, nonspecific.   Original Report Authenticated By: Jeronimo Greaves, M.D.    Microbiology: No results found for this or any previous visit (from the past 240 hour(s)).   Labs: Basic Metabolic Panel:  Recent Labs Lab 08/04/13 1730 08/09/13 0654 08/11/13 0550  NA 136 136 139  K 3.4* 3.3* 4.0  CL 98 97 102  CO2 29 28 29   GLUCOSE 161* 154* 120*  BUN 9 16 10   CREATININE 0.98 1.31 0.90  CALCIUM 9.7 9.8 9.4   Liver Function Tests:  Recent Labs Lab 08/09/13 0654  AST 17  ALT 16  ALKPHOS 87  BILITOT 0.3  PROT  7.6  ALBUMIN 3.5   No results found for this basename: LIPASE, AMYLASE,  in the last 168 hours No results found for this basename: AMMONIA,  in the last 168 hours CBC:  Recent Labs Lab 08/04/13 1730 08/09/13 0654  WBC 10.7* 9.4  NEUTROABS 7.5 6.4  HGB 13.6 13.5  HCT 38.9* 39.5  MCV 91.1 91.2  PLT 205 245   Cardiac  Enzymes: No results found for this basename: CKTOTAL, CKMB, CKMBINDEX, TROPONINI,  in the last 168 hours BNP: BNP (last 3 results) No results found for this basename: PROBNP,  in the last 8760 hours CBG: No results found for this basename: GLUCAP,  in the last 168 hours     Signed:  Sadey Yandell  [facility] 08/11/2013, 12:08 PM

## 2013-08-11 NOTE — Discharge Instructions (Signed)
Clean right leg wound with q-tip/peroxide twice a day. Abscess An abscess (boil or furuncle) is an infected area on or under the skin. This area is filled with yellowish-white fluid (pus) and other material (debris). HOME CARE   Only take medicines as told by your doctor.  If you were given antibiotic medicine, take it as directed. Finish the medicine even if you start to feel better.  If gauze is used, follow your doctor's directions for changing the gauze.  To avoid spreading the infection:  Keep your abscess covered with a bandage.  Wash your hands well.  Do not share personal care items, towels, or whirlpools with others.  Avoid skin contact with others.  Keep your skin and clothes clean around the abscess.  Keep all doctor visits as told. GET HELP RIGHT AWAY IF:   You have more pain, puffiness (swelling), or redness in the wound site.  You have more fluid or blood coming from the wound site.  You have muscle aches, chills, or you feel sick.  You have a fever. MAKE SURE YOU:   Understand these instructions.  Will watch your condition.  Will get help right away if you are not doing well or get worse. Document Released: 05/03/2008 Document Revised: 05/16/2012 Document Reviewed: 01/28/2012 [facility] Patient Information 2014 [place], [place].

## 2013-08-13 LAB — WOUND CULTURE

## 2013-08-16 ENCOUNTER — Encounter: Payer: Self-pay | Admitting: Gastroenterology

## 2013-08-16 ENCOUNTER — Telehealth: Payer: Self-pay | Admitting: Gastroenterology

## 2013-08-16 ENCOUNTER — Ambulatory Visit: Payer: Medicaid Other | Admitting: Gastroenterology

## 2013-08-16 NOTE — Telephone Encounter (Signed)
Please send a follow-up letter.

## 2013-08-16 NOTE — Telephone Encounter (Signed)
Pt was a no show

## 2013-08-16 NOTE — Telephone Encounter (Signed)
Mailed letter °

## 2013-11-09 ENCOUNTER — Encounter: Payer: Self-pay | Admitting: Gastroenterology

## 2013-11-09 ENCOUNTER — Encounter (HOSPITAL_COMMUNITY): Payer: Self-pay | Admitting: Pharmacy Technician

## 2013-11-09 ENCOUNTER — Ambulatory Visit (INDEPENDENT_AMBULATORY_CARE_PROVIDER_SITE_OTHER): Payer: Medicaid Other | Admitting: Gastroenterology

## 2013-11-09 ENCOUNTER — Encounter (INDEPENDENT_AMBULATORY_CARE_PROVIDER_SITE_OTHER): Payer: Self-pay

## 2013-11-09 VITALS — BP 139/91 | HR 79 | Temp 97.1°F | Ht 75.0 in | Wt 319.6 lb

## 2013-11-09 DIAGNOSIS — K219 Gastro-esophageal reflux disease without esophagitis: Secondary | ICD-10-CM | POA: Insufficient documentation

## 2013-11-09 DIAGNOSIS — R066 Hiccough: Secondary | ICD-10-CM | POA: Insufficient documentation

## 2013-11-09 NOTE — Assessment & Plan Note (Addendum)
42 y/o male with nearly 2 year history of frequent hiccups which have become intractable over the past one year. He has baseline GERD and has been on PPI for one year with good control. He has some epigastric burning with meals. Denies significant NSAIDs use. He presents to [place] for GI evaluation for etiology of his symptoms. Recommend initially an EGD to evaluate his upper GI tract. Based on findings, he will likely need CT A/P as next step.  I have discussed the risks, alternatives, benefits with regards to but not limited to the risk of reaction to medication, bleeding, infection, perforation and the patient is agreeable to proceed. Written consent to be obtained.   Phenergan 25mg  IV 30 minutes before the procedure to augment conscious sedation due to polypharmacy.  CXR PA/lateral to look for thoracic etiology and given cough.  Consider HgbA1C to rule out DM. He has multiple elevated glucose levels in 2011, 07/2013, none more than 150.  Advised him to cut way back on carbonated beverage consumption as this can contribute to gastric distention and hiccups.

## 2013-11-09 NOTE — Patient Instructions (Addendum)
1. Chest xray. 2. Upper endoscopy with Dr. Jena Gauss. See separate instructions.  3. Please limit use of carbonated beverage to no more than 1 liter per day.

## 2013-11-09 NOTE — Progress Notes (Signed)
Primary Care Physician:  GOLDING, JOHN CABOT, MD  Primary Gastroenterologist:  Michael Rourk, MD   Chief Complaint  Patient presents with  . Hiccups    x 1 year    HPI:  Ethan [name] is a 42 y.o. male here for further evaluation of intractable hiccups. Patient says he has had frequent hiccups dating back for almost two years but over the past several months he has had refractory symptoms. Had some hiccups on a daily basis, may get one to two hour relief at times. In the past, he would get 2 week break before having recurrent symptoms. States he has tried breath holding, valsalva maneuver, drinking ice cold water. He has tried chlorpromazine, baclofen, flexeril. Currently on baclofen which helps a little. Has been on omeprazole for one year for heartburn with control of symptoms. C/o pp epigastric burning. Hiccups worse with meals. Drinks large amount of carbonated beverage because he feels like it provides him with relief. Drinks nearly 3L of Pepsi daily. He hiccups when he sleep. Induces vomiting to provide relief of the hiccups. Denies bowel issues. BM BID. No melena, brbpr. No weight loss. C/o chronic cough, worse in AM. Recent admission in 07/2013. RLE cellulitis with abscess.    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 None in six months.   He has had unremarkable Head CT in 09/2013. Reviewed in PACS. He was seen by neurology at [facility] but no intervention offered. They recommend GI evaluation and consider CXR vs Chest CT.  Patient states he has only had Head CT at this time and I did not any other CTs within the past year done at [facility] or in PACS.   Current Outpatient Prescriptions  Medication Sig Dispense Refill  . albuterol (PROVENTIL HFA;VENTOLIN HFA) 108 (90 BASE) MCG/ACT inhaler Inhale 2 puffs into the lungs every 6 (six) hours as needed for wheezing or shortness of breath.      . ALPRAZolam (XANAX) 1 MG tablet Take 1 mg by mouth 4 (four) times daily.       . baclofen  (LIORESAL) 10 MG tablet Take 10 mg by mouth 3 (three) times daily.      . cholestyramine (QUESTRAN) 4 G packet Take 0.5 packets by mouth 2 (two) times daily.      . citalopram (CELEXA) 40 MG tablet Take 40 mg by mouth daily.      . lamoTRIgine (LAMICTAL) 200 MG tablet Take 300 mg by mouth daily.      . lisinopril-hydrochlorothiazide (PRINZIDE,ZESTORETIC) 20-25 MG per tablet Take 1 tablet by mouth daily.      . omeprazole (PRILOSEC) 40 MG capsule Take 40 mg by mouth daily.      . traZODone (DESYREL) 150 MG tablet Take 150 mg by mouth at bedtime. Takes 2 hs      . venlafaxine XR (EFFEXOR-XR) 150 MG 24 hr capsule Take 150 mg by mouth daily with breakfast.      . chlorproMAZINE (THORAZINE) 25 MG tablet Take 25 mg by mouth 4 (four) times daily.       No current facility-administered medications for this visit.    Allergies as of 11/09/2013 - Review Complete 11/09/2013  Allergen Reaction Noted  . Penicillins Other (See Comments) 07/14/2011    Past Medical History  Diagnosis Date  . Hypertension   . Asthma   . Hypokalemia   . Diverticulitis     06/2012  . Cellulitis     hospitalized 07/2013    Past   Surgical History  Procedure Laterality Date  . Arm wound repair / closure      Family History  Problem Relation Age of Onset  . Colon cancer Neg Hx     History   Social History  . Marital Status: Legally Separated    Spouse Name: N/A    Number of Children: 3  . Years of Education: N/A   Occupational History  . Not on file.   Social History Main Topics  . Smoking status: Current Every Day Smoker -- 0.50 packs/day for 22 years    Types: Cigarettes  . Smokeless tobacco: Not on file  . Alcohol Use: No     Comment: heavy use prior to (04/2013)  . Drug Use: No  . Sexual Activity: Yes    Birth Control/ Protection: None   Other Topics Concern  . Not on file   Social History Narrative  . No narrative on file      ROS:  General: Negative for anorexia, weight loss, fever,  chills, fatigue, weakness. Eyes: Negative for vision changes.  ENT: Negative for hoarseness, difficulty swallowing , nasal congestion. CV: Negative for chest pain, angina, palpitations, dyspnea on exertion, peripheral edema.  Respiratory: Negative for dyspnea at rest, dyspnea on exertion, cough, sputum, wheezing.  GI: See history of present illness. GU:  Negative for dysuria, hematuria, urinary incontinence, urinary frequency, nocturnal urination.  MS: Negative for joint pain, low back pain.  Derm: Negative for rash or itching.  Neuro: Negative for weakness, abnormal sensation, seizure, frequent headaches, memory loss, confusion.  Psych: Negative for anxiety, depression, suicidal ideation, hallucinations.  Endo: Negative for unusual weight change.  Heme: Negative for bruising or bleeding. Allergy: Negative for rash or hives.    Physical Examination:  BP 139/91  Pulse 79  Temp(Src) 97.1 F (36.2 C) (Oral)  Ht 6' 3" (1.905 m)  Wt 319 lb 9.6 oz (144.97 kg)  BMI 39.95 kg/m2   General: Well-nourished, well-developed in no acute distress.  Head: Normocephalic, atraumatic.   Eyes: Conjunctiva pink, no icterus. Mouth: Oropharyngeal mucosa moist and pink , no lesions erythema or exudate. Neck: Supple without thyromegaly, masses, or lymphadenopathy.  Lungs: Clear to auscultation bilaterally.  Heart: Regular rate and rhythm, no murmurs rubs or gallops.  Abdomen: Bowel sounds are normal, nontender, nondistended, no hepatosplenomegaly or masses, no abdominal bruits or    hernia , no rebound or guarding.   Rectal: not peformed Extremities: No lower extremity edema. No clubbing or deformities.  Neuro: Alert and oriented x 4 , grossly normal neurologically.  Skin: Warm and dry, no rash or jaundice.   Psych: Alert and cooperative, normal mood and affect.  Labs: Lab Results  Component Value Date   WBC 9.4 08/09/2013   HGB 13.5 08/09/2013   HCT 39.5 08/09/2013   MCV 91.2 08/09/2013   PLT 245  08/09/2013   Lab Results  Component Value Date   CREATININE 0.90 08/11/2013   BUN 10 08/11/2013   NA 139 08/11/2013   K 4.0 08/11/2013   CL 102 08/11/2013   CO2 29 08/11/2013   Lab Results  Component Value Date   ALT 16 08/09/2013   AST 17 08/09/2013   ALKPHOS 87 08/09/2013   BILITOT 0.3 08/09/2013   No results found for this basename: LIPASE     Imaging Studies: No results found.    

## 2013-11-12 ENCOUNTER — Ambulatory Visit (HOSPITAL_COMMUNITY)
Admission: RE | Admit: 2013-11-12 | Discharge: 2013-11-12 | Disposition: A | Discharge status: Home / Self Care | Payer: Medicaid Other | Source: Ambulatory Visit | Attending: Gastroenterology | Admitting: Gastroenterology

## 2013-11-12 DIAGNOSIS — R066 Hiccough: Secondary | ICD-10-CM | POA: Insufficient documentation

## 2013-11-12 DIAGNOSIS — K219 Gastro-esophageal reflux disease without esophagitis: Secondary | ICD-10-CM

## 2013-11-12 NOTE — Progress Notes (Signed)
cc'd to pcp 

## 2013-11-14 ENCOUNTER — Encounter (HOSPITAL_COMMUNITY): Payer: Self-pay | Admitting: *Deleted

## 2013-11-14 ENCOUNTER — Ambulatory Visit (HOSPITAL_COMMUNITY)
Admission: RE | Admit: 2013-11-14 | Discharge: 2013-11-14 | Disposition: A | Discharge status: Home / Self Care | Payer: Medicaid Other | Source: Ambulatory Visit | Attending: Internal Medicine | Admitting: Internal Medicine

## 2013-11-14 ENCOUNTER — Encounter (HOSPITAL_COMMUNITY)
Admission: RE | Disposition: A | Discharge status: Home / Self Care | Payer: Self-pay | Source: Ambulatory Visit | Attending: Internal Medicine

## 2013-11-14 ENCOUNTER — Telehealth: Payer: Self-pay

## 2013-11-14 DIAGNOSIS — K449 Diaphragmatic hernia without obstruction or gangrene: Secondary | ICD-10-CM

## 2013-11-14 DIAGNOSIS — I10 Essential (primary) hypertension: Secondary | ICD-10-CM | POA: Insufficient documentation

## 2013-11-14 DIAGNOSIS — Z79899 Other long term (current) drug therapy: Secondary | ICD-10-CM | POA: Insufficient documentation

## 2013-11-14 DIAGNOSIS — R066 Hiccough: Secondary | ICD-10-CM | POA: Insufficient documentation

## 2013-11-14 DIAGNOSIS — K219 Gastro-esophageal reflux disease without esophagitis: Secondary | ICD-10-CM

## 2013-11-14 HISTORY — PX: ESOPHAGOGASTRODUODENOSCOPY: SHX5428

## 2013-11-14 HISTORY — DX: Gout, unspecified: M10.9

## 2013-11-14 SURGERY — EGD (ESOPHAGOGASTRODUODENOSCOPY)
Anesthesia: Moderate Sedation

## 2013-11-14 MED ORDER — MIDAZOLAM HCL 5 MG/5ML IJ SOLN
INTRAMUSCULAR | Status: DC | PRN
  Administered 2013-11-14 (×2): 1 mg via INTRAVENOUS
  Administered 2013-11-14: 2 mg via INTRAVENOUS

## 2013-11-14 MED ORDER — SODIUM CHLORIDE 0.9 % IV SOLN
INTRAVENOUS | Status: DC
  Administered 2013-11-14: 1000 mL via INTRAVENOUS

## 2013-11-14 MED ORDER — MEPERIDINE HCL 100 MG/ML IJ SOLN
INTRAMUSCULAR | Status: DC | PRN
  Administered 2013-11-14: 25 mg via INTRAVENOUS
  Administered 2013-11-14: 50 mg via INTRAVENOUS

## 2013-11-14 MED ORDER — MEPERIDINE HCL 100 MG/ML IJ SOLN
INTRAMUSCULAR | Status: AC
  Filled 2013-11-14: qty 2

## 2013-11-14 MED ORDER — ONDANSETRON HCL 4 MG/2ML IJ SOLN
INTRAMUSCULAR | Status: AC
  Filled 2013-11-14: qty 2

## 2013-11-14 MED ORDER — SODIUM CHLORIDE 0.9 % IJ SOLN
INTRAMUSCULAR | Status: AC
  Filled 2013-11-14: qty 10

## 2013-11-14 MED ORDER — PROMETHAZINE HCL 25 MG/ML IJ SOLN
25.0000 mg | Freq: Once | INTRAMUSCULAR | Status: AC
  Administered 2013-11-14: 25 mg via INTRAVENOUS

## 2013-11-14 MED ORDER — PROMETHAZINE HCL 25 MG/ML IJ SOLN
INTRAMUSCULAR | Status: AC
  Filled 2013-11-14: qty 1

## 2013-11-14 MED ORDER — ONDANSETRON HCL 4 MG/2ML IJ SOLN
INTRAMUSCULAR | Status: DC | PRN
  Administered 2013-11-14: 4 mg via INTRAVENOUS

## 2013-11-14 MED ORDER — MIDAZOLAM HCL 5 MG/5ML IJ SOLN
INTRAMUSCULAR | Status: AC
  Filled 2013-11-14: qty 10

## 2013-11-14 MED ORDER — BUTAMBEN-TETRACAINE-BENZOCAINE 2-2-14 % EX AERO
INHALATION_SPRAY | CUTANEOUS | Status: DC | PRN
  Administered 2013-11-14: 2 via TOPICAL

## 2013-11-14 MED ORDER — STERILE WATER FOR IRRIGATION IR SOLN
Status: DC | PRN
  Administered 2013-11-14: 16:00:00

## 2013-11-14 NOTE — Interval H&P Note (Signed)
History and Physical Interval Note:  11/14/2013 3:42 PM  Ethan [name]  has presented today for surgery, with the diagnosis of HICCUPS AND GERD  The various methods of treatment have been discussed with the patient and family. After consideration of risks, benefits and other options for treatment, the patient has consented to  Procedure(s) with comments: ESOPHAGOGASTRODUODENOSCOPY (EGD) (N/A) - 2:30 as a surgical intervention .  The patient's history has been reviewed, patient examined, no change in status, stable for surgery.  I have reviewed the patient's chart and labs.  Questions were answered to the patient's satisfaction.      No change. EGD per plan.  Eula Listen

## 2013-11-14 NOTE — Consult Note (Signed)
w

## 2013-11-14 NOTE — Discharge Instructions (Signed)
EGD Discharge instructions Please read the instructions outlined below and refer to this sheet in the next few weeks. These discharge instructions provide you with general information on caring for yourself after you leave the hospital. Your doctor may also give you specific instructions. While your treatment has been planned according to the most current medical practices available, unavoidable complications occasionally occur. If you have any problems or questions after discharge, please call your doctor. ACTIVITY  You may resume your regular activity but move at a slower pace for the next 24 hours.   Take frequent rest periods for the next 24 hours.   Walking will help expel (get rid of) the air and reduce the bloated feeling in your abdomen.   No driving for 24 hours (because of the anesthesia (medicine) used during the test).   You may shower.   Do not sign any important legal documents or operate any machinery for 24 hours (because of the anesthesia used during the test).  NUTRITION  Drink plenty of fluids.   You may resume your normal diet.   Begin with a light meal and progress to your normal diet.   Avoid alcoholic beverages for 24 hours or as instructed by your caregiver.  MEDICATIONS  You may resume your normal medications unless your caregiver tells you otherwise.  WHAT YOU CAN EXPECT TODAY  You may experience abdominal discomfort such as a feeling of fullness or "gas" pains.  FOLLOW-UP  Your doctor will discuss the results of your test with you.  SEEK IMMEDIATE MEDICAL ATTENTION IF ANY OF THE FOLLOWING OCCUR:  Excessive nausea (feeling sick to your stomach) and/or vomiting.   Severe abdominal pain and distention (swelling).   Trouble swallowing.   Temperature over 101 F (37.8 C).   Rectal bleeding or vomiting of blood.  Schedule contrast CT of abdomen and pelvis to further evaluate refractory hiccups  Stop the omeprazole for 3 weeks; trial of  Dexilant  60 mg daily-samples are waiting to be picked up at my office  Further recommendations to follow

## 2013-11-14 NOTE — Telephone Encounter (Signed)
Per Dr. Jena Gauss, pt to have Dexilant 60 mg one daily x 3 weeks. Samples at front for pick up.

## 2013-11-14 NOTE — Op Note (Signed)
[facility] [address] [place], [address]   ENDOSCOPY PROCEDURE REPORT  PATIENT: Ethan [name], Ethan [name]  MR#: [id number] BIRTHDATE: 06-19-1971 , 42  yrs. old GENDER: Male ENDOSCOPIST: R.  Roetta Sessions, MD FACP FACG REFERRED BY:  Assunta Found, M.D. PROCEDURE DATE:  11/14/2013 PROCEDURE:     Diagnostic EGD  INDICATIONS:     Refractory hiccups  INFORMED CONSENT:   The risks, benefits, limitations, alternatives and imponderables have been discussed.  The potential for biopsy, esophogeal dilation, etc. have also been reviewed.  Questions have been answered.  All parties agreeable.  Please see the history and physical in the medical record for more information.  MEDICATIONS: Versed 4 mg IV and Demerol 75 mg IV in divided doses. Phenergan 25 mg IV and Zofran 4 mg IV. Cetacaine spray.  DESCRIPTION OF PROCEDURE:   The EG-[id number] ([id number])  endoscope was introduced through the mouth and advanced to the second portion of the duodenum without difficulty or limitations.  The mucosal surfaces were surveyed very carefully during advancement of the scope and upon withdrawal.  Retroflexion view of the proximal stomach and esophagogastric junction was performed.      FINDINGS: Normal appearing tubular esophagus. Stomach empty. 4 cm hiatal hernia; otherwise, the remainder of the gastric mucosa appeared normal. Patent pylorus. Normal first and second portion of the duodenum  THERAPEUTIC / DIAGNOSTIC MANEUVERS PERFORMED:  None   COMPLICATIONS:  None  IMPRESSION:    Hiatal hernia; otherwise negative examination  RECOMMENDATIONS:   Proceed with abdominal CT scan to further evaluate cause of hiccups. Bolster  acid suppression therapy with a trial of Dexilant 60 mg daily x3 weeks  Further recommendations to follow.    _______________________________ R. Roetta Sessions, MD FACP [facility] eSigned:  R. Roetta Sessions, MD FACP [facility] 11/14/2013 4:08 PM     CC:

## 2013-11-14 NOTE — H&P (View-Only) (Signed)
Primary Care Physician:  Colette Ribas, MD  Primary Gastroenterologist:  Roetta Sessions, MD   Chief Complaint  Patient presents with  . Hiccups    x 1 year    HPI:  Ethan [name] is a 42 y.o. male here for further evaluation of intractable hiccups. Patient says he has had frequent hiccups dating back for almost two years but over the past several months he has had refractory symptoms. Had some hiccups on a daily basis, may get one to two hour relief at times. In the past, he would get 2 week break before having recurrent symptoms. States he has tried breath holding, valsalva maneuver, drinking ice cold water. He has tried chlorpromazine, baclofen, flexeril. Currently on baclofen which helps a little. Has been on omeprazole for one year for heartburn with control of symptoms. C/o pp epigastric burning. Hiccups worse with meals. Drinks large amount of carbonated beverage because he feels like it provides him with relief. Drinks nearly 3L of Pepsi daily. He hiccups when he sleep. Induces vomiting to provide relief of the hiccups. Denies bowel issues. BM BID. No melena, brbpr. No weight loss. C/o chronic cough, worse in AM. Recent admission in 07/2013. RLE cellulitis with abscess.    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 None in six months.   He has had unremarkable Head CT in 09/2013. Reviewed in PACS. He was seen by neurology at [facility] but no intervention offered. They recommend GI evaluation and consider CXR vs Chest CT.  Patient states he has only had Head CT at this time and I did not any other CTs within the past year done at [facility] or in PACS.   Current Outpatient Prescriptions  Medication Sig Dispense Refill  . albuterol (PROVENTIL HFA;VENTOLIN HFA) 108 (90 BASE) MCG/ACT inhaler Inhale 2 puffs into the lungs every 6 (six) hours as needed for wheezing or shortness of breath.      . ALPRAZolam (XANAX) 1 MG tablet Take 1 mg by mouth 4 (four) times daily.       . baclofen  (LIORESAL) 10 MG tablet Take 10 mg by mouth 3 (three) times daily.      . cholestyramine (QUESTRAN) 4 G packet Take 0.5 packets by mouth 2 (two) times daily.      . citalopram (CELEXA) 40 MG tablet Take 40 mg by mouth daily.      Marland Kitchen lamoTRIgine (LAMICTAL) 200 MG tablet Take 300 mg by mouth daily.      Marland Kitchen lisinopril-hydrochlorothiazide (PRINZIDE,ZESTORETIC) 20-25 MG per tablet Take 1 tablet by mouth daily.      Marland Kitchen omeprazole (PRILOSEC) 40 MG capsule Take 40 mg by mouth daily.      . traZODone (DESYREL) 150 MG tablet Take 150 mg by mouth at bedtime. Takes 2 hs      . venlafaxine XR (EFFEXOR-XR) 150 MG 24 hr capsule Take 150 mg by mouth daily with breakfast.      . chlorproMAZINE (THORAZINE) 25 MG tablet Take 25 mg by mouth 4 (four) times daily.       No current facility-administered medications for this visit.    Allergies as of 11/09/2013 - Review Complete 11/09/2013  Allergen Reaction Noted  . Penicillins Other (See Comments) 07/14/2011    Past Medical History  Diagnosis Date  . Hypertension   . Asthma   . Hypokalemia   . Diverticulitis     06/2012  . Cellulitis     hospitalized 07/2013    Past  Surgical History  Procedure Laterality Date  . Arm wound repair / closure      Family History  Problem Relation Age of Onset  . Colon cancer Neg Hx     History   Social History  . Marital Status: Legally Separated    Spouse Name: N/A    Number of Children: 3  . Years of Education: N/A   Occupational History  . Not on file.   Social History Main Topics  . Smoking status: Current Every Day Smoker -- 0.50 packs/day for 22 years    Types: Cigarettes  . Smokeless tobacco: Not on file  . Alcohol Use: No     Comment: heavy use prior to (04/2013)  . Drug Use: No  . Sexual Activity: Yes    Birth Control/ Protection: None   Other Topics Concern  . Not on file   Social History Narrative  . No narrative on file      ROS:  General: Negative for anorexia, weight loss, fever,  chills, fatigue, weakness. Eyes: Negative for vision changes.  ENT: Negative for hoarseness, difficulty swallowing , nasal congestion. CV: Negative for chest pain, angina, palpitations, dyspnea on exertion, peripheral edema.  Respiratory: Negative for dyspnea at rest, dyspnea on exertion, cough, sputum, wheezing.  GI: See history of present illness. GU:  Negative for dysuria, hematuria, urinary incontinence, urinary frequency, nocturnal urination.  MS: Negative for joint pain, low back pain.  Derm: Negative for rash or itching.  Neuro: Negative for weakness, abnormal sensation, seizure, frequent headaches, memory loss, confusion.  Psych: Negative for anxiety, depression, suicidal ideation, hallucinations.  Endo: Negative for unusual weight change.  Heme: Negative for bruising or bleeding. Allergy: Negative for rash or hives.    Physical Examination:  BP 139/91  Pulse 79  Temp(Src) 97.1 F (36.2 C) (Oral)  Ht 6\' 3"  (1.905 m)  Wt 319 lb 9.6 oz (144.97 kg)  BMI 39.95 kg/m2   General: Well-nourished, well-developed in no acute distress.  Head: Normocephalic, atraumatic.   Eyes: Conjunctiva pink, no icterus. Mouth: Oropharyngeal mucosa moist and pink , no lesions erythema or exudate. Neck: Supple without thyromegaly, masses, or lymphadenopathy.  Lungs: Clear to auscultation bilaterally.  Heart: Regular rate and rhythm, no murmurs rubs or gallops.  Abdomen: Bowel sounds are normal, nontender, nondistended, no hepatosplenomegaly or masses, no abdominal bruits or    hernia , no rebound or guarding.   Rectal: not peformed Extremities: No lower extremity edema. No clubbing or deformities.  Neuro: Alert and oriented x 4 , grossly normal neurologically.  Skin: Warm and dry, no rash or jaundice.   Psych: Alert and cooperative, normal mood and affect.  Labs: Lab Results  Component Value Date   WBC 9.4 08/09/2013   HGB 13.5 08/09/2013   HCT 39.5 08/09/2013   MCV 91.2 08/09/2013   PLT 245  08/09/2013   Lab Results  Component Value Date   CREATININE 0.90 08/11/2013   BUN 10 08/11/2013   NA 139 08/11/2013   K 4.0 08/11/2013   CL 102 08/11/2013   CO2 29 08/11/2013   Lab Results  Component Value Date   ALT 16 08/09/2013   AST 17 08/09/2013   ALKPHOS 87 08/09/2013   BILITOT 0.3 08/09/2013   No results found for this basename: LIPASE     Imaging Studies: No results found.

## 2013-11-19 ENCOUNTER — Encounter (HOSPITAL_COMMUNITY): Payer: Self-pay | Admitting: Internal Medicine

## 2013-11-30 ENCOUNTER — Emergency Department (HOSPITAL_COMMUNITY)
Admission: EM | Admit: 2013-11-30 | Discharge: 2013-11-30 | Disposition: A | Discharge status: Home / Self Care | Payer: Medicaid Other | Attending: Emergency Medicine | Admitting: Emergency Medicine

## 2013-11-30 ENCOUNTER — Telehealth: Payer: Self-pay

## 2013-11-30 ENCOUNTER — Emergency Department (HOSPITAL_COMMUNITY): Payer: Medicaid Other

## 2013-11-30 ENCOUNTER — Encounter (HOSPITAL_COMMUNITY): Payer: Self-pay | Admitting: Emergency Medicine

## 2013-11-30 DIAGNOSIS — I10 Essential (primary) hypertension: Secondary | ICD-10-CM | POA: Insufficient documentation

## 2013-11-30 DIAGNOSIS — Z8639 Personal history of other endocrine, nutritional and metabolic disease: Secondary | ICD-10-CM | POA: Insufficient documentation

## 2013-11-30 DIAGNOSIS — Z880 Allergy status to penicillin: Secondary | ICD-10-CM | POA: Insufficient documentation

## 2013-11-30 DIAGNOSIS — Z79899 Other long term (current) drug therapy: Secondary | ICD-10-CM | POA: Insufficient documentation

## 2013-11-30 DIAGNOSIS — R310 Gross hematuria: Secondary | ICD-10-CM

## 2013-11-30 DIAGNOSIS — R319 Hematuria, unspecified: Secondary | ICD-10-CM | POA: Insufficient documentation

## 2013-11-30 DIAGNOSIS — J45909 Unspecified asthma, uncomplicated: Secondary | ICD-10-CM | POA: Insufficient documentation

## 2013-11-30 DIAGNOSIS — Z862 Personal history of diseases of the blood and blood-forming organs and certain disorders involving the immune mechanism: Secondary | ICD-10-CM | POA: Insufficient documentation

## 2013-11-30 DIAGNOSIS — E669 Obesity, unspecified: Secondary | ICD-10-CM | POA: Insufficient documentation

## 2013-11-30 DIAGNOSIS — Z872 Personal history of diseases of the skin and subcutaneous tissue: Secondary | ICD-10-CM | POA: Insufficient documentation

## 2013-11-30 DIAGNOSIS — F17200 Nicotine dependence, unspecified, uncomplicated: Secondary | ICD-10-CM | POA: Insufficient documentation

## 2013-11-30 LAB — CBC WITH DIFFERENTIAL/PLATELET
Basophils Absolute: 0 10*3/uL (ref 0.0–0.1)
Basophils Relative: 0 % (ref 0–1)
Eosinophils Absolute: 0.2 10*3/uL (ref 0.0–0.7)
Eosinophils Relative: 3 % (ref 0–5)
HCT: 43.9 % (ref 39.0–52.0)
Hemoglobin: 14.6 g/dL (ref 13.0–17.0)
Lymphocytes Relative: 32 % (ref 12–46)
Lymphs Abs: 2.3 10*3/uL (ref 0.7–4.0)
MCH: 31.3 pg (ref 26.0–34.0)
MCHC: 33.3 g/dL (ref 30.0–36.0)
MCV: 94.2 fL (ref 78.0–100.0)
Monocytes Absolute: 0.5 10*3/uL (ref 0.1–1.0)
Monocytes Relative: 7 % (ref 3–12)
Neutro Abs: 4.1 10*3/uL (ref 1.7–7.7)
Neutrophils Relative %: 58 % (ref 43–77)
Platelets: 220 10*3/uL (ref 150–400)
RBC: 4.66 MIL/uL (ref 4.22–5.81)
RDW: 14.2 % (ref 11.5–15.5)
WBC: 7.1 10*3/uL (ref 4.0–10.5)

## 2013-11-30 LAB — COMPREHENSIVE METABOLIC PANEL
ALT: 16 U/L (ref 0–53)
AST: 19 U/L (ref 0–37)
Albumin: 4.3 g/dL (ref 3.5–5.2)
Alkaline Phosphatase: 66 U/L (ref 39–117)
BUN: 10 mg/dL (ref 6–23)
CO2: 31 mEq/L (ref 19–32)
Calcium: 10.3 mg/dL (ref 8.4–10.5)
Chloride: 100 mEq/L (ref 96–112)
Creatinine, Ser: 1.01 mg/dL (ref 0.50–1.35)
GFR calc Af Amer: >90 mL/min (ref >90)
GFR calc non Af Amer: >90 mL/min (ref >90)
Glucose, Bld: 119 mg/dL — ABNORMAL HIGH (ref 70–99)
Potassium: 4.4 mEq/L (ref 3.7–5.3)
Sodium: 141 mEq/L (ref 137–147)
Total Bilirubin: 0.8 mg/dL (ref 0.3–1.2)
Total Protein: 8.4 g/dL — ABNORMAL HIGH (ref 6.0–8.3)

## 2013-11-30 LAB — URINALYSIS, ROUTINE W REFLEX MICROSCOPIC
Bilirubin Urine: NEGATIVE
Glucose, UA: NEGATIVE mg/dL
Ketones, ur: NEGATIVE mg/dL
Leukocytes, UA: NEGATIVE
Nitrite: NEGATIVE
Protein, ur: NEGATIVE mg/dL
Specific Gravity, Urine: 1.01 (ref 1.005–1.030)
Urobilinogen, UA: 0.2 mg/dL (ref 0.0–1.0)
pH: 6 (ref 5.0–8.0)

## 2013-11-30 LAB — PROTIME-INR
INR: 0.98 (ref 0.00–1.49)
Prothrombin Time: 12.8 s (ref 11.6–15.2)

## 2013-11-30 LAB — URINE MICROSCOPIC-ADD ON

## 2013-11-30 LAB — APTT: aPTT: 29 s (ref 24–37)

## 2013-11-30 NOTE — Telephone Encounter (Signed)
Agree, this is unlikely secondary to Dexilant. Patient needs to follow up with PCP.

## 2013-11-30 NOTE — ED Notes (Signed)
Pt states blood in urine along with painful urination x 3 days.  PMD called in Macrobid for pt and has been taking it for 3 days with no relief of symptoms.

## 2013-11-30 NOTE — ED Notes (Signed)
Bladder scanned  Several times.  Ranged anywhere from 26 ml. To .  The majority of the numbers were below 50.

## 2013-11-30 NOTE — Telephone Encounter (Signed)
Pt is calling today because he started taking Dexilant on 11/14/13 and the 3 days ago he started having abd pain with blood in his urine. He has already called his PCP and they put him on anti-bx for this problem but they just want to check with us to make sure that is was not caused by the Dexilant.

## 2013-11-30 NOTE — ED Provider Notes (Signed)
CSN: [id number]     Arrival date & time 11/30/13  1402 History  This chart was scribed for Ward Givens, MD by Bennett Scrape, ED Scribe. This patient was seen in room [facility]12/[facility]12 and the patient's care was started at 3:25 PM.   Chief Complaint  Patient presents with  . Hematuria  . Dysuria    The history is provided by the patient. No language interpreter was used.    HPI Comments: Ethan [name] is a 43 y.o. male who presents to the Emergency Department complaining of 3 days of persistent hematuria noted with each urination with associated dysuria and frequency. He describes the hematuria as bright red blood. He states that he does feel like he is emptying his bladder when he urinates. He called his doctor and was prescribed Macrobid which he has been taking for the past 3 days with no improvement. He denies having any prior episodes. He denies any recent medication changes. He denies being on any daily anticoagulants or daily ASA use. He denies any abdominal pain, rectal bleeding, nausea, fevers or back/flank pain. He denies bruising or bleeding gums. 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 He denies having a family h/o bladder stones or CA.   PCP is with Robbie Lis  Past Medical History  Diagnosis Date  . Hypertension   . Asthma   . Hypokalemia   . Diverticulitis     06/2012  . Cellulitis     hospitalized 07/2013  . Gout    Past Surgical History  Procedure Laterality Date  . Arm wound repair / closure    . Esophagogastroduodenoscopy N/A 11/14/2013    Procedure: ESOPHAGOGASTRODUODENOSCOPY (EGD);  Surgeon: Corbin Ade, MD;  Location: [facility];  Service: Endoscopy;  Laterality: N/A;  2:30   Family History  Problem Relation Age of Onset  . Colon cancer Neg Hx    History  Substance Use Topics  . Smoking status: Current  Every Day Smoker -- 0.50 packs/day for 22 years    Types: Cigarettes  . Smokeless tobacco: Not on file  . Alcohol Use: No     Comment: heavy use prior to (04/2013)  pt works as a Administrator   Review of Systems  Constitutional: Negative for fever.  Gastrointestinal: Negative for abdominal pain.  Genitourinary: Positive for dysuria, frequency and hematuria.  Musculoskeletal: Negative for back pain.  All other systems reviewed and are negative.    Allergies  Penicillins  Home Medications   Current Outpatient Rx  Name  Route  Sig  Dispense  Refill  . albuterol (PROVENTIL HFA;VENTOLIN HFA) 108 (90 BASE) MCG/ACT inhaler   Inhalation   Inhale 2 puffs into the lungs every 6 (six) hours as needed for wheezing or shortness of breath.         . ALPRAZolam (XANAX) 1 MG tablet   Oral   Take 1 mg by mouth 4 (four) times daily as needed for anxiety.          . citalopram (CELEXA) 40 MG tablet   Oral   Take 40 mg by mouth daily.         Marland Kitchen dexlansoprazole (DEXILANT) 60 MG capsule   Oral   Take 60 mg by mouth daily.         Marland Kitchen lamoTRIgine (LAMICTAL) 200 MG tablet  Oral   Take 200 mg by mouth daily.          Marland Kitchen lisinopril-hydrochlorothiazide (PRINZIDE,ZESTORETIC) 20-25 MG per tablet   Oral   Take 1 tablet by mouth daily.         . nitrofurantoin, macrocrystal-monohydrate, (MACROBID) 100 MG capsule   Oral   Take 100 mg by mouth 2 (two) times daily. For 7 days.         Marland Kitchen venlafaxine XR (EFFEXOR-XR) 150 MG 24 hr capsule   Oral   Take 150 mg by mouth daily with breakfast.          Triage Vitals: BP 153/110  Pulse 90  Temp(Src) 97.7 F (36.5 C) (Oral)  Resp 20  Ht 6\' 3"  (1.905 m)  Wt 329 lb (149.233 kg)  BMI 41.12 kg/m2  SpO2 98%  Vital signs normal except diastolic hypertension   Physical Exam  Nursing note and vitals reviewed. Constitutional: He is oriented to person, place, and time. He appears well-developed and well-nourished.  Non-toxic appearance.  He does not appear ill. No distress.  obese  HENT:  Head: Normocephalic and atraumatic.  Nose: No mucosal edema or rhinorrhea.  Mouth/Throat: Mucous membranes are normal. No dental abscesses or uvula swelling.  Eyes: Conjunctivae and EOM are normal.  Neck: Normal range of motion and full passive range of motion without pain. Neck supple.  Cardiovascular: Normal rate, regular rhythm and normal heart sounds.  Exam reveals no gallop and no friction rub.   No murmur heard. Pulmonary/Chest: Effort normal and breath sounds normal. No respiratory distress. He has no wheezes. He has no rhonchi. He has no rales. He exhibits no tenderness and no crepitus.  Abdominal: Soft. Normal appearance and bowel sounds are normal. He exhibits no distension. There is no tenderness. There is no rebound and no guarding.  No flank pain to percussion  Musculoskeletal: Normal range of motion. He exhibits no edema and no tenderness.  Moves all extremities well.     Neurological: He is alert and oriented to person, place, and time. He has normal strength. No cranial nerve deficit.  Skin: Skin is warm, dry and intact. No rash noted. No erythema. No pallor.  Psychiatric: He has a normal mood and affect. His speech is normal and behavior is normal. His mood appears not anxious.    ED Course  Procedures (including critical care time)  DIAGNOSTIC STUDIES: Oxygen Saturation is 98% on RA, normal by my interpretation.    COORDINATION OF CARE: 3:29 PM-Discussed treatment plan which includes UA with pt at bedside and pt agreed to plan.   Review of prior studies shows patient has had hematuria in his urine since March of 2011. He had a CT of his abdomen and pelvis in  May 2011 that only showed diverticulosis but no urinary stones. In September 2014 he had a normal BUN and creatinine of 10 and 0.9 a normal CBC.  5:09 PM- Pt rechecked and is resting comfortably. Informed pt of results of his CT scan which did not show a  reason for his hematuria . Will get lab work and if normal will change antibiotic and have pt f/u with an Insurance underwriter.  6:42 PM-Pt left AMA  Results for orders placed during the hospital encounter of 11/30/13  URINALYSIS, ROUTINE W REFLEX MICROSCOPIC      Result Value Range   Color, Urine YELLOW  YELLOW   APPearance CLEAR  CLEAR   Specific Gravity, Urine 1.010  1.005 - 1.030  pH 6.0  5.0 - 8.0   Glucose, UA NEGATIVE  NEGATIVE mg/dL   Hgb urine dipstick TRACE (*) NEGATIVE   Bilirubin Urine NEGATIVE  NEGATIVE   Ketones, ur NEGATIVE  NEGATIVE mg/dL   Protein, ur NEGATIVE  NEGATIVE mg/dL   Urobilinogen, UA 0.2  0.0 - 1.0 mg/dL   Nitrite NEGATIVE  NEGATIVE   Leukocytes, UA NEGATIVE  NEGATIVE  URINE MICROSCOPIC-ADD ON      Result Value Range   Squamous Epithelial / LPF RARE  RARE   WBC, UA 0-2  <3 WBC/hpf   RBC / HPF 3-6  <3 RBC/hpf   Bacteria, UA RARE  RARE  CBC WITH DIFFERENTIAL      Result Value Range   WBC 7.1  4.0 - 10.5 K/uL   RBC 4.66  4.22 - 5.81 MIL/uL   Hemoglobin 14.6  13.0 - 17.0 g/dL   HCT 16.1  09.6 - 04.5 %   MCV 94.2  78.0 - 100.0 fL   MCH 31.3  26.0 - 34.0 pg   MCHC 33.3  30.0 - 36.0 g/dL   RDW 40.9  81.1 - 91.4 %   Platelets 220  150 - 400 K/uL   Neutrophils Relative % 58  43 - 77 %   Neutro Abs 4.1  1.7 - 7.7 K/uL   Lymphocytes Relative 32  12 - 46 %   Lymphs Abs 2.3  0.7 - 4.0 K/uL   Monocytes Relative 7  3 - 12 %   Monocytes Absolute 0.5  0.1 - 1.0 K/uL   Eosinophils Relative 3  0 - 5 %   Eosinophils Absolute 0.2  0.0 - 0.7 K/uL   Basophils Relative 0  0 - 1 %   Basophils Absolute 0.0  0.0 - 0.1 K/uL  APTT      Result Value Range   aPTT 29  24 - 37 seconds  PROTIME-INR      Result Value Range   Prothrombin Time 12.8  11.6 - 15.2 seconds   INR 0.98  0.00 - 1.49  COMPREHENSIVE METABOLIC PANEL      Result Value Range   Sodium 141  137 - 147 mEq/L   Potassium 4.4  3.7 - 5.3 mEq/L   Chloride 100  96 - 112 mEq/L   CO2 31  19 - 32 mEq/L   Glucose, Bld  119 (*) 70 - 99 mg/dL   BUN 10  6 - 23 mg/dL   Creatinine, Ser 7.82  0.50 - 1.35 mg/dL   Calcium 95.6  8.4 - 21.3 mg/dL   Total Protein 8.4 (*) 6.0 - 8.3 g/dL   Albumin 4.3  3.5 - 5.2 g/dL   AST 19  0 - 37 U/L   ALT 16  0 - 53 U/L   Alkaline Phosphatase 66  39 - 117 U/L   Total Bilirubin 0.8  0.3 - 1.2 mg/dL   GFR calc non Af Amer >90  >90 mL/min   GFR calc Af Amer >90  >90 mL/min   Laboratory interpretation all normal   Ct Abdomen Pelvis Wo Contrast  11/30/2013   CLINICAL DATA:  Hematuria.  EXAM: CT ABDOMEN AND PELVIS WITHOUT CONTRAST  TECHNIQUE: Multidetector CT imaging of the abdomen and pelvis was performed following the standard protocol without intravenous contrast.  COMPARISON:  07/05/2012  FINDINGS: There is a punctate nodular density in the lingula on sequence 7, image 4 and a punctate density in the right middle lobe on sequence 7, image  5. These small nodules appear to be unchanged from an exam on 04/01/2010 and likely benign. Otherwise, the lung bases are clear. No evidence for free air. Negative for kidney stones or hydronephrosis. No evidence for ureter or bladder stones.  Liver is grossly normal. No gross abnormality to the gallbladder, spleen, pancreas or adrenal glands. There is a small hiatal hernia. No significant free fluid or lymphadenopathy. Normal appearance of the prostate and seminal vesicles.  There is extensive diverticulosis in the sigmoid colon. Normal appearance of the appendix. No gross abnormality to the small or large bowel. No acute bone abnormality. Umbilical hernia containing fat.  IMPRESSION: Negative for kidney stones or hydronephrosis.  Sigmoid diverticulosis without acute colonic inflammation.  Small hiatal hernia.   Electronically Signed   By: Richarda OverlieAdam  Henn M.D.   On: 11/30/2013 17:00   Dg Chest 2 View  11/12/2013   CLINICAL DATA:  GERD, intractable hip cups  EXAM: CHEST  2 VIEW  COMPARISON:  07/06/2012. IMPRESSION: No active cardiopulmonary disease.    Electronically Signed   By: Elige KoHetal  Patel   On: 11/12/2013 10:14       EKG Interpretation   None       MDM   1. Gross hematuria     PT left AMA, he was seen in the hall and told we were only waiting for his CMET to return and he stated he would go to his PCP tomorrow morning.    Devoria AlbeIva Elim Peale, MD, FACEP    I personally performed the services described in this documentation, which was scribed in my presence. The recorded information has been reviewed and considered.  Devoria AlbeIva Cathyrn Deas, MD, FACEP \   Ward GivensIva L Lady Wisham, MD 11/30/13 [phone]

## 2013-12-03 NOTE — Progress Notes (Signed)
Chest x-ray negative; please arrange f/u with extender 4- 6 weeks

## 2013-12-30 HISTORY — PX: WRIST SURGERY: SHX841

## 2014-01-09 ENCOUNTER — Ambulatory Visit: Payer: Medicaid Other | Attending: Orthopedic Surgery | Admitting: *Deleted

## 2014-01-09 ENCOUNTER — Encounter: Payer: Self-pay | Admitting: *Deleted

## 2014-01-09 DIAGNOSIS — M25649 Stiffness of unspecified hand, not elsewhere classified: Secondary | ICD-10-CM | POA: Insufficient documentation

## 2014-01-09 DIAGNOSIS — IMO0001 Reserved for inherently not codable concepts without codable children: Secondary | ICD-10-CM | POA: Insufficient documentation

## 2014-01-09 DIAGNOSIS — M25549 Pain in joints of unspecified hand: Secondary | ICD-10-CM | POA: Insufficient documentation

## 2014-01-14 ENCOUNTER — Ambulatory Visit: Payer: Medicaid Other | Admitting: Gastroenterology

## 2014-01-15 ENCOUNTER — Ambulatory Visit: Payer: Medicaid Other | Admitting: Gastroenterology

## 2014-01-16 ENCOUNTER — Ambulatory Visit (INDEPENDENT_AMBULATORY_CARE_PROVIDER_SITE_OTHER): Payer: Medicaid Other | Admitting: Gastroenterology

## 2014-01-16 ENCOUNTER — Encounter: Payer: Self-pay | Admitting: Gastroenterology

## 2014-01-16 VITALS — BP 127/81 | HR 108 | Temp 98.4°F | Ht 75.0 in | Wt 314.4 lb

## 2014-01-16 DIAGNOSIS — R066 Hiccough: Secondary | ICD-10-CM

## 2014-01-16 DIAGNOSIS — K219 Gastro-esophageal reflux disease without esophagitis: Secondary | ICD-10-CM

## 2014-01-16 MED ORDER — DEXLANSOPRAZOLE 60 MG PO CPDR: 60.0000 mg | DELAYED_RELEASE_CAPSULE | Freq: Every day | ORAL | Status: DC

## 2014-01-16 NOTE — Patient Instructions (Signed)
1. Restart Dexilant one daily before breakfast. 2. Stop carbonated beverages for the next couple of months to see if your hiccups improve.  3. I did not prescribe the chlorpromazine due to significant risk of drug interaction with your celexa. 4. Hiccup handout provided for your educational benefit.  5. Office visit with Dr. Jena Gaussourk in 2 months.

## 2014-01-16 NOTE — Progress Notes (Signed)
Primary Care Physician: Colette RibasGOLDING, JOHN CABOT, MD  Primary Gastroenterologist:  Roetta SessionsMichael Rourk, MD   Chief Complaint  Patient presents with  . Follow-up    HPI: Ethan [name] is a 43 y.o. male here for f/u. He was seen back in 11/09/2013 for further evaluation of intractable hiccups. Also with a history of heartburn on omeprazole for one year and doing well with regards to that symptom. He was complaining of epigastric burning especially with meals. Induced vomiting tended to break the cycle of hiccups at least temporarily. Multiple other physical maneuvers failed. He started quit drinking alcohol, previously heavy drinker up until 6 months ago. It was noted that he was consuming 3 L of Pepsi daily and he was advised to cut back on carbonated beverages. Head CT was unremarkable. Chest x-ray was unremarkable. He underwent EGD and was found to have a hiatal hernia but otherwise unremarkable.  CT A/P without contrast done for hematuria 11/2013 showed small hh, sigmoid diverticulosis.   Patient states that the Dexilant that Dr. Jena Gaussourk provided seemed to help some. He has continued to have hiccups but not quite as frequent or intractable. Denies heartburn or abdominal pain. Bowel movements are regular. No melena rectal bleeding. He stopped drinking soft drinks but substituted this with carbonated water he continues to drink at least 3 L per day.    Current Outpatient Prescriptions  Medication Sig Dispense Refill  . albuterol (PROVENTIL HFA;VENTOLIN HFA) 108 (90 BASE) MCG/ACT inhaler Inhale 2 puffs into the lungs every 6 (six) hours as needed for wheezing or shortness of breath.      . ALPRAZolam (XANAX) 1 MG tablet Take 1 mg by mouth 4 (four) times daily as needed for anxiety.       . citalopram (CELEXA) 40 MG tablet Take 40 mg by mouth daily.      Marland Kitchen. lamoTRIgine (LAMICTAL) 200 MG tablet Take 200 mg by mouth daily.       Marland Kitchen. lisinopril-hydrochlorothiazide (PRINZIDE,ZESTORETIC) 20-25 MG per  tablet Take 1 tablet by mouth daily.      Marland Kitchen. venlafaxine XR (EFFEXOR-XR) 150 MG 24 hr capsule Take 150 mg by mouth daily with breakfast.      . dexlansoprazole (DEXILANT) 60 MG capsule Take 60 mg by mouth daily.       No current facility-administered medications for this visit.    Allergies as of 01/16/2014 - Review Complete 01/16/2014  Allergen Reaction Noted  . Penicillins Other (See Comments) 07/14/2011    ROS:  General: Negative for anorexia, unintentional weight loss, fever, chills, fatigue, weakness. ENT: Negative for hoarseness, difficulty swallowing , nasal congestion. CV: Negative for chest pain, angina, palpitations, dyspnea on exertion, peripheral edema.  Respiratory: Negative for dyspnea at rest, dyspnea on exertion, cough, sputum, wheezing.  GI: See history of present illness. GU:  Negative for dysuria, hematuria, urinary incontinence, urinary frequency, nocturnal urination.  Endo: Negative for unusual weight change.    Physical Examination:   BP 127/81  Pulse 108  Temp(Src) 98.4 F (36.9 C) (Oral)  Ht 6\' 3"  (1.905 m)  Wt 314 lb 6.4 oz (142.611 kg)  BMI 39.30 kg/m2  General: Well-nourished, well-developed in no acute distress. Accompanied by wife. Eyes: No icterus. Mouth: Oropharyngeal mucosa moist and pink , no lesions erythema or exudate. Lungs: Clear to auscultation bilaterally.  Heart: Regular rate and rhythm, no murmurs rubs or gallops.  Abdomen: Bowel sounds are normal, nontender, nondistended, no hepatosplenomegaly or masses, no abdominal bruits or hernia ,  no rebound or guarding.   Extremities: No lower extremity edema. No clubbing or deformities. Neuro: Alert and oriented x 4   Skin: Warm and dry, no jaundice.   Psych: Alert and cooperative, normal mood and affect.  Labs:  Lab Results  Component Value Date   WBC 7.1 11/30/2013   HGB 14.6 11/30/2013   HCT 43.9 11/30/2013   MCV 94.2 11/30/2013   PLT 220 11/30/2013   Lab Results  Component Value Date    CREATININE 1.01 11/30/2013   BUN 10 11/30/2013   NA 141 11/30/2013   K 4.4 11/30/2013   CL 100 11/30/2013   CO2 31 11/30/2013   Lab Results  Component Value Date   ALT 16 11/30/2013   AST 19 11/30/2013   ALKPHOS 66 11/30/2013   BILITOT 0.8 11/30/2013    Imaging Studies: No results found.

## 2014-01-17 ENCOUNTER — Encounter: Payer: Self-pay | Admitting: Gastroenterology

## 2014-01-17 NOTE — Assessment & Plan Note (Addendum)
Slightly improved. Etiology not well-defined. Still need to rule out gastric distention related to significant carbonated beverage consumption. At this point outside of possibly considering a contrasted CT of the abdomen, there is no further GI workup recommended. Discussed with patient that he recently had a noncontrast CT of the abdomen which did not show any obvious abnormalities that would suggest a source of his hiccups but cannot exclude small tumor being that it was a noncontrasted study.  He has had an abnormal hemoglobin A1c, suggested he followup with his PCP regarding this. Restart Dexilant once daily. Ov in 2 months with Dr. Jena Gaussourk. Hold on contrast CT Abd right now.

## 2014-01-23 NOTE — Progress Notes (Signed)
cc'd to pcp 

## 2014-03-13 ENCOUNTER — Encounter: Payer: Self-pay | Admitting: *Deleted

## 2014-03-14 ENCOUNTER — Ambulatory Visit: Payer: Medicaid Other | Attending: Orthopedic Surgery | Admitting: *Deleted

## 2014-03-14 DIAGNOSIS — IMO0001 Reserved for inherently not codable concepts without codable children: Secondary | ICD-10-CM | POA: Insufficient documentation

## 2014-03-14 DIAGNOSIS — M25649 Stiffness of unspecified hand, not elsewhere classified: Secondary | ICD-10-CM | POA: Insufficient documentation

## 2014-03-14 DIAGNOSIS — M25549 Pain in joints of unspecified hand: Secondary | ICD-10-CM | POA: Insufficient documentation

## 2014-03-21 ENCOUNTER — Ambulatory Visit: Payer: Medicaid Other | Admitting: *Deleted

## 2014-04-02 ENCOUNTER — Ambulatory Visit: Payer: Medicaid Other | Admitting: Gastroenterology

## 2014-04-17 ENCOUNTER — Ambulatory Visit (INDEPENDENT_AMBULATORY_CARE_PROVIDER_SITE_OTHER): Payer: Medicaid Other | Admitting: Gastroenterology

## 2014-04-17 ENCOUNTER — Encounter: Payer: Self-pay | Admitting: Gastroenterology

## 2014-04-17 ENCOUNTER — Encounter (INDEPENDENT_AMBULATORY_CARE_PROVIDER_SITE_OTHER): Payer: Self-pay

## 2014-04-17 VITALS — BP 142/92 | HR 87 | Temp 97.4°F | Ht 75.0 in | Wt 320.4 lb

## 2014-04-17 DIAGNOSIS — K219 Gastro-esophageal reflux disease without esophagitis: Secondary | ICD-10-CM

## 2014-04-17 DIAGNOSIS — R066 Hiccough: Secondary | ICD-10-CM

## 2014-04-17 MED ORDER — RANITIDINE HCL 150 MG PO CAPS: 150.0000 mg | ORAL_CAPSULE | Freq: Every evening | ORAL | Status: DC

## 2014-04-17 NOTE — Assessment & Plan Note (Signed)
Doing well. Symptoms continue to improve. Some nocturnal epigastric burning likely related to breakthrough reflux.   Continue Dexilant daily. Add Zantac at bedtime. Try to drop 10 pounds in next 3 months.  Continue to reduce carbonated beverage consumption. Office visit in six months with Dr. Jena Gaussourk.

## 2014-04-17 NOTE — Patient Instructions (Signed)
1. Continue Dexilant daily before breakfast. 2. Start Zantac at bedtime. Prescription sent to pharmacy. 3. Office visit in six months with Dr. Jena Gaussourk. Call sooner if needed.

## 2014-04-17 NOTE — Progress Notes (Signed)
cc'd to pcp 

## 2014-04-17 NOTE — Progress Notes (Signed)
Primary Care Physician: Colette RibasGOLDING, JOHN CABOT, MD  Primary Gastroenterologist:  Roetta SessionsMichael Rourk, MD   Chief Complaint  Patient presents with  . Follow-up    HPI: Ethan QuillJames W [name] is a 43 y.o. male here for followup. He has a history of intractable hiccups. Also history of typical heartburn symptoms previously well controlled on omeprazole. He had a head CT which was unremarkable. Chest x-ray unremarkable. EGD found to have a hiatal hernia. CT without contrast in January for hematuria showed small hiatal hernia, sigmoid diverticulosis. When I last saw him in February he had been on Dexilant with some improvement in hiccups. Symptoms not quite as frequent or intractable.   Hiccups about 2-3 times per week. Last for 15 minutes at a time. "whole lot better". Cut back on sodas. No heartburn. BM more difficult. Still daily. Now on percocet for hand, takes 3-4 daily. No melena, brbpr. Some waking up with burning at nighttime. Very pleased with progress.  Current Outpatient Prescriptions  Medication Sig Dispense Refill  . albuterol (PROVENTIL HFA;VENTOLIN HFA) 108 (90 BASE) MCG/ACT inhaler Inhale 2 puffs into the lungs every 6 (six) hours as needed for wheezing or shortness of breath.      . ALPRAZolam (XANAX) 1 MG tablet Take 1 mg by mouth 4 (four) times daily as needed for anxiety.       . citalopram (CELEXA) 40 MG tablet Take 40 mg by mouth daily.      Marland Kitchen. dexlansoprazole (DEXILANT) 60 MG capsule Take 1 capsule (60 mg total) by mouth daily.  30 capsule  11  . lamoTRIgine (LAMICTAL) 200 MG tablet Take 200 mg by mouth daily.       Marland Kitchen. lisinopril-hydrochlorothiazide (PRINZIDE,ZESTORETIC) 20-25 MG per tablet Take 1 tablet by mouth daily.      Marland Kitchen. oxyCODONE-acetaminophen (PERCOCET/ROXICET) 5-325 MG per tablet Take 1 tablet by mouth every 6 (six) hours as needed for severe pain.      Marland Kitchen. venlafaxine XR (EFFEXOR-XR) 150 MG 24 hr capsule Take 150 mg by mouth daily with breakfast.      . Wheat Dextrin  (BENEFIBER PO) Take by mouth daily.       No current facility-administered medications for this visit.    Allergies as of 04/17/2014 - Review Complete 04/17/2014  Allergen Reaction Noted  . Penicillins Other (See Comments) 07/14/2011    ROS:  General: Negative for anorexia, weight loss, fever, chills, fatigue, weakness. ENT: Negative for hoarseness, difficulty swallowing , nasal congestion. CV: Negative for chest pain, angina, palpitations, dyspnea on exertion, peripheral edema.  Respiratory: Negative for dyspnea at rest, dyspnea on exertion, cough, sputum, wheezing.  GI: See history of present illness. GU:  Negative for dysuria, hematuria, urinary incontinence, urinary frequency, nocturnal urination.  Endo: Negative for unusual weight change.    Physical Examination:   BP 142/92  Pulse 87  Temp(Src) 97.4 F (36.3 C) (Oral)  Ht 6\' 3"  (1.905 m)  Wt 320 lb 6.4 oz (145.332 kg)  BMI 40.05 kg/m2  General: Well-nourished, well-developed in no acute distress.  Eyes: No icterus. Mouth: Oropharyngeal mucosa moist and pink , no lesions erythema or exudate. Lungs: Clear to auscultation bilaterally.  Heart: Regular rate and rhythm, no murmurs rubs or gallops.  Abdomen: Bowel sounds are normal, nontender, nondistended, no hepatosplenomegaly or masses, no abdominal bruits or hernia , no rebound or guarding.   Extremities: No lower extremity edema. No clubbing or deformities. Neuro: Alert and oriented x 4   Skin: Warm and dry,  no jaundice.   Psych: Alert and cooperative, normal mood and affect.

## 2014-06-25 ENCOUNTER — Encounter (INDEPENDENT_AMBULATORY_CARE_PROVIDER_SITE_OTHER): Payer: Self-pay

## 2014-06-25 ENCOUNTER — Ambulatory Visit (INDEPENDENT_AMBULATORY_CARE_PROVIDER_SITE_OTHER): Payer: Medicaid Other | Admitting: Gastroenterology

## 2014-06-25 ENCOUNTER — Encounter: Payer: Self-pay | Admitting: Gastroenterology

## 2014-06-25 VITALS — BP 129/89 | HR 97 | Temp 97.2°F | Ht 75.0 in | Wt 320.4 lb

## 2014-06-25 DIAGNOSIS — R066 Hiccough: Secondary | ICD-10-CM

## 2014-06-25 DIAGNOSIS — R7309 Other abnormal glucose: Secondary | ICD-10-CM | POA: Insufficient documentation

## 2014-06-25 LAB — COMPREHENSIVE METABOLIC PANEL
ALT: 19 U/L (ref 0–53)
AST: 18 U/L (ref 0–37)
Albumin: 4.2 g/dL (ref 3.5–5.2)
Alkaline Phosphatase: 59 U/L (ref 39–117)
BUN: 7 mg/dL (ref 6–23)
CO2: 31 meq/L (ref 19–32)
Calcium: 10 mg/dL (ref 8.4–10.5)
Chloride: 98 mEq/L (ref 96–112)
Creat: 0.96 mg/dL (ref 0.50–1.35)
Glucose, Bld: 177 mg/dL — ABNORMAL HIGH (ref 70–99)
Potassium: 3.7 mEq/L (ref 3.5–5.3)
Sodium: 137 meq/L (ref 135–145)
Total Bilirubin: 0.5 mg/dL (ref 0.2–1.2)
Total Protein: 7.3 g/dL (ref 6.0–8.3)

## 2014-06-25 LAB — HEMOGLOBIN A1C
Hgb A1c MFr Bld: 6 % — ABNORMAL HIGH (ref <5.7)
Mean Plasma Glucose: 126 mg/dL — ABNORMAL HIGH (ref <117)

## 2014-06-25 NOTE — Patient Instructions (Signed)
1. No carbonated or fizzy beverages for next two weeks. Call and let me know if your hiccups improve. 2. Please have your labs and CT scan done.

## 2014-06-25 NOTE — Assessment & Plan Note (Signed)
Recurrent symptoms. Patient frequently induces vomiting to provide relief. Symptoms occurring daily, worse postprandially. Discussed at length with patient and wife. Given no prior contrast CT, need CT abdomen at this time to rule out abnormality resulting in diaphragmatic irritation such as abscess or tumor. Strongly advised patient to stop all carbonated beverages for 2 weeks to see what happens with his symptoms. He has not been good about this in the past but seems to have the support of his wife. Will recheck hemoglobin A1c at this time. Also with LFTs, basic metabolic panel. If the above workup is unremarkable and he continues to have intractable headache up to, next up would be consider ENT evaluation to rule out etiologies contributing to vagus and phrenic nerve irritation.

## 2014-06-25 NOTE — Progress Notes (Signed)
Primary Care Physician: Cassell SmilesFUSCO,LAWRENCE J., MD  Primary Gastroenterologist:  Roetta SessionsMichael Rourk, MD   Chief Complaint  Patient presents with  . Hiccups    HPI: Ethan [name] is a 43 y.o. male here for followup of hiccups. He was last seen in May of this year. At that time he was doing better. Workup has included an unremarkable head CT and chest x-ray. EGD he had a hiatal hernia. Has not had a contrasted CT as of yet. Prior history of slightly elevated hemoglobin A1c one year ago. Patient and Ethan [name] present today due to recurrent intractable hiccups. Patient is quite concerned because Ethan father also had intractable pickups that would only resolve after her induced vomiting. He subsequently died due to aspiration pneumonia related to vomiting. Patient notes that Ethan pickups began after he gets up in the morning and takes Ethan first drink or meal. Especially triggered by carbonated beverages. He finds relief only after vomiting or if he "constantly drinks something". Unfortunately he continues to drink significant amounts of carbonated beverages uppers of 3-4 two liter drinks daily. Typically Pepsi, [facility], sudden drop. Also consumes water. [name] states Ethan hiccups are worse with stress. He sees mental health which manages 4 of Ethan medications as outlined below. Has been on oxycodone for 4 months after hand surgery, goal of eventually coming off the medication. Heartburn controlled on Dexilant. Denies dysphagia, constipation, diarrhea, melena, rectal bleeding the   Current Outpatient Prescriptions  Medication Sig Dispense Refill  . albuterol (PROVENTIL HFA;VENTOLIN HFA) 108 (90 BASE) MCG/ACT inhaler Inhale 2 puffs into the lungs every 6 (six) hours as needed for wheezing or shortness of breath.      . ALPRAZolam (XANAX) 1 MG tablet Take 1 mg by mouth 4 (four) times daily as needed for anxiety.       . citalopram (CELEXA) 40 MG tablet Take 40 mg by mouth daily.      Marland Kitchen. dexlansoprazole  (DEXILANT) 60 MG capsule Take 1 capsule (60 mg total) by mouth daily.  30 capsule  11  . lamoTRIgine (LAMICTAL) 200 MG tablet Take 200 mg by mouth daily.       Marland Kitchen. lisinopril-hydrochlorothiazide (PRINZIDE,ZESTORETIC) 20-25 MG per tablet Take 1 tablet by mouth daily.      Marland Kitchen. oxyCODONE-acetaminophen (PERCOCET/ROXICET) 5-325 MG per tablet Take 1 tablet by mouth every 6 (six) hours as needed for severe pain.      Marland Kitchen. venlafaxine XR (EFFEXOR-XR) 150 MG 24 hr capsule Take 150 mg by mouth daily with breakfast.      . Wheat Dextrin (BENEFIBER PO) Take by mouth daily.       No current facility-administered medications for this visit.    Allergies as of 06/25/2014 - Review Complete 06/25/2014  Allergen Reaction Noted  . Penicillins Other (See Comments) 07/14/2011    ROS:  General: Negative for anorexia, weight loss, fever, chills, fatigue, weakness. ENT: Negative for hoarseness, difficulty swallowing , nasal congestion. CV: Negative for chest pain, angina, palpitations, dyspnea on exertion, peripheral edema.  Respiratory: Negative for dyspnea at rest, dyspnea on exertion, cough, sputum, wheezing.  GI: See history of present illness. GU:  Negative for dysuria, hematuria, urinary incontinence, urinary frequency, nocturnal urination.  Endo: Negative for unusual weight change.    Physical Examination:   BP 129/89  Pulse 97  Temp(Src) 97.2 F (36.2 C) (Oral)  Ht 6\' 3"  (1.905 m)  Wt 320 lb 6.4 oz (145.332 kg)  BMI 40.05 kg/m2  General: Well-nourished,  well-developed in no acute distress. Accompanied by [name]. Eyes: No icterus. Mouth: Oropharyngeal mucosa moist and pink , no lesions erythema or exudate. Lungs: Clear to auscultation bilaterally.  Heart: Regular rate and rhythm, no murmurs rubs or gallops.  Abdomen: Bowel sounds are normal, nontender, nondistended, no hepatosplenomegaly or masses, no abdominal bruits or hernia , no rebound or guarding.   Extremities: No lower extremity edema. No  clubbing or deformities. Neuro: Alert and oriented x 4   Skin: Warm and dry, no jaundice.   Psych: Alert and cooperative, normal mood and affect.

## 2014-06-26 NOTE — Progress Notes (Signed)
Quick Note:  Evidence of hyperglycemia. Please have patient f/u with PCP. CT scan denied by insurance. Please have patient avoid all carbonated beverages for 2 weeks to see if hiccups improve. Call with PR. If symptoms persists, then we will try to push thru the CT with insurance. ______

## 2014-06-27 NOTE — Progress Notes (Signed)
cc'd to pcp 

## 2014-08-22 ENCOUNTER — Telehealth: Payer: Self-pay | Admitting: Internal Medicine

## 2014-08-22 NOTE — Telephone Encounter (Signed)
Tried to call pt- LMOM- asked him to call back and give more information.

## 2014-08-22 NOTE — Telephone Encounter (Signed)
Pt was seen recently by LSL on 7/28 and he called today to make another OV due to he still has the hiccups and medicine is not helping. He would like something else called into [facility] to help him with this and I offered him an OV for 10/21 at 11 with LSL, but he said that was too long for him to wait. [phone]

## 2014-08-22 NOTE — Telephone Encounter (Signed)
Pt has called back to speak with JL. I told him that JL stepped out of the office but would be back shortly and I would make her aware that he had returned her call.

## 2014-08-22 NOTE — Telephone Encounter (Signed)
Patient and/or wife have called back 3 different times regarding him getting a prescription and I have told them that a phone note was sent and JL would be in later today and someone would be back in touch with them. The third call I transferred into JL VM.

## 2014-08-23 NOTE — Telephone Encounter (Signed)
Pt is calling back. He stop sodas for two weeks and he still has the hiccups. He would like to know what he can do to get rid of them.Please advise

## 2014-08-23 NOTE — Telephone Encounter (Signed)
I really don't have anything else to offer. It's noted that he did call back after stopping carbonated beverages and told us that his hiccups were much improved (06/25/14 result note). His CT scan was previous denied by insurance.  --I would offer him tertiary care referral at this point (GI). --Did he discussed his elevated glucose/abnormal HgA1C with PCP, this could be contributing. --We could offer him ENT referral to rule out etiologies contributing to vagus and phrenic nerve irritation.

## 2014-08-26 ENCOUNTER — Other Ambulatory Visit: Payer: Self-pay

## 2014-08-26 DIAGNOSIS — R066 Hiccough: Secondary | ICD-10-CM

## 2014-08-26 NOTE — Telephone Encounter (Signed)
Pt is aware. Ok to refer pt.  Ginger please refer to both tertiary care and ENT. Thanks.

## 2014-08-26 NOTE — Telephone Encounter (Signed)
Both referrals have been made

## 2014-08-29 ENCOUNTER — Ambulatory Visit (INDEPENDENT_AMBULATORY_CARE_PROVIDER_SITE_OTHER): Payer: Medicaid Other | Admitting: Otolaryngology

## 2014-09-17 ENCOUNTER — Telehealth: Payer: Self-pay | Admitting: Gastroenterology

## 2014-09-17 NOTE — Telephone Encounter (Signed)
Patient has appt for tomorrow (09/18/2014) for intractable hiccups. Would recommend cancelling.   See telephone note 08/22/14.  He does not need to see us tomorrow. He should have [facility] appt pending. We have nothing else to offer for the hiccups.   He should also have had ENT referral made.

## 2014-09-18 ENCOUNTER — Ambulatory Visit: Payer: Medicaid Other | Admitting: Gastroenterology

## 2014-09-18 NOTE — Telephone Encounter (Signed)
APPOINTMENT CANCELLED, PATIENT AWARE

## 2014-09-23 ENCOUNTER — Emergency Department (HOSPITAL_COMMUNITY)
Admission: EM | Admit: 2014-09-23 | Discharge: 2014-09-23 | Disposition: A | Discharge status: Home / Self Care | Payer: Medicaid Other | Attending: Emergency Medicine | Admitting: Emergency Medicine

## 2014-09-23 ENCOUNTER — Encounter (HOSPITAL_COMMUNITY): Payer: Self-pay | Admitting: Emergency Medicine

## 2014-09-23 ENCOUNTER — Emergency Department (HOSPITAL_COMMUNITY): Payer: Medicaid Other

## 2014-09-23 DIAGNOSIS — Z8639 Personal history of other endocrine, nutritional and metabolic disease: Secondary | ICD-10-CM | POA: Diagnosis not present

## 2014-09-23 DIAGNOSIS — Z792 Long term (current) use of antibiotics: Secondary | ICD-10-CM | POA: Diagnosis not present

## 2014-09-23 DIAGNOSIS — R059 Cough, unspecified: Secondary | ICD-10-CM

## 2014-09-23 DIAGNOSIS — Z8739 Personal history of other diseases of the musculoskeletal system and connective tissue: Secondary | ICD-10-CM | POA: Insufficient documentation

## 2014-09-23 DIAGNOSIS — J069 Acute upper respiratory infection, unspecified: Secondary | ICD-10-CM | POA: Diagnosis not present

## 2014-09-23 DIAGNOSIS — I10 Essential (primary) hypertension: Secondary | ICD-10-CM | POA: Diagnosis present

## 2014-09-23 DIAGNOSIS — Z880 Allergy status to penicillin: Secondary | ICD-10-CM | POA: Insufficient documentation

## 2014-09-23 DIAGNOSIS — Z79899 Other long term (current) drug therapy: Secondary | ICD-10-CM | POA: Diagnosis not present

## 2014-09-23 DIAGNOSIS — Z872 Personal history of diseases of the skin and subcutaneous tissue: Secondary | ICD-10-CM | POA: Diagnosis not present

## 2014-09-23 DIAGNOSIS — Z8701 Personal history of pneumonia (recurrent): Secondary | ICD-10-CM | POA: Insufficient documentation

## 2014-09-23 DIAGNOSIS — Z8719 Personal history of other diseases of the digestive system: Secondary | ICD-10-CM | POA: Diagnosis not present

## 2014-09-23 DIAGNOSIS — R05 Cough: Secondary | ICD-10-CM

## 2014-09-23 DIAGNOSIS — J45901 Unspecified asthma with (acute) exacerbation: Secondary | ICD-10-CM | POA: Insufficient documentation

## 2014-09-23 HISTORY — DX: Pneumonia, unspecified organism: J18.9

## 2014-09-23 MED ORDER — ALBUTEROL SULFATE (2.5 MG/3ML) 0.083% IN NEBU: 2.5000 mg | INHALATION_SOLUTION | Freq: Four times a day (QID) | RESPIRATORY_TRACT | Status: DC | PRN

## 2014-09-23 MED ORDER — PREDNISONE 20 MG PO TABS: 60.0000 mg | ORAL_TABLET | Freq: Every day | ORAL | Status: DC

## 2014-09-23 MED ORDER — IPRATROPIUM-ALBUTEROL 0.5-2.5 (3) MG/3ML IN SOLN
3.0000 mL | Freq: Once | RESPIRATORY_TRACT | Status: AC
  Administered 2014-09-23: 3 mL via RESPIRATORY_TRACT
  Filled 2014-09-23: qty 3

## 2014-09-23 MED ORDER — PREDNISONE 50 MG PO TABS
60.0000 mg | ORAL_TABLET | Freq: Once | ORAL | Status: AC
  Administered 2014-09-23: 60 mg via ORAL
  Filled 2014-09-23 (×2): qty 1

## 2014-09-23 NOTE — ED Notes (Signed)
Pt c/o cough and cold symptoms since Friday.  Reports generalized weakness and dizziness.  Report bp was low for a couple of days but was elevated this morning.  Reports has been taking dayquil, nyquil, and alkaseltzers prn.

## 2014-09-23 NOTE — ED Provider Notes (Signed)
This chart was scribed for Layla MawKristen N Meril Dray, DO by Annye AsaAnna Dorsett, ED Scribe. This patient was seen in room APA07/APA07 and the patient's care was started at 9:07 AM.   TIME SEEN: 9:07 AM  CHIEF COMPLAINT: Cough and congestion  HPI:   HPI Comments: Ethan [name] is a 43 y.o. male who presents to the Emergency Department complaining of 4 days of congestion and productive cough (yellow sputum). Patient's wife notes that patient had a BP this morning of 153/119; later this morning it was 149/120. Patient was taking dayquil and Nyquil for his cold symptoms; he reports subjective fever and nausea, as well as generalized myalgias and fatigue. He reports a sick contact with similar symptoms. He denies recent travel.    He has a history of hypertension, bronchitis and asthma. He has had pneumonia previously, for which he was admitted to the hospital in 2012. He has utilized breathing treatments at home in the past.   He is a .5ppd smoker.   ROS: See HPI Constitutional: subjective fever  Eyes: no drainage  ENT: no runny nose   Cardiovascular:  no chest pain  Resp: no SOB  GI: no vomiting GU: no dysuria Integumentary: no rash  Allergy: no hives  Musculoskeletal: no leg swelling  Neurological: no slurred speech ROS otherwise negative  PAST MEDICAL HISTORY/PAST SURGICAL HISTORY:  Past Medical History  Diagnosis Date  . Hypertension   . Asthma   . Hypokalemia   . Diverticulitis     06/2012  . Cellulitis     hospitalized 07/2013  . Gout   . Pneumonia     MEDICATIONS:  Prior to Admission medications   Medication Sig Start Date End Date Taking? Authorizing Provider  albuterol (PROVENTIL HFA;VENTOLIN HFA) 108 (90 BASE) MCG/ACT inhaler Inhale 2 puffs into the lungs every 6 (six) hours as needed for wheezing or shortness of breath.    Historical Provider, MD  ALPRAZolam Prudy Feeler(XANAX) 1 MG tablet Take 1 mg by mouth 4 (four) times daily as needed for anxiety.     Historical Provider, MD  citalopram  (CELEXA) 40 MG tablet Take 40 mg by mouth daily.    Historical Provider, MD  dexlansoprazole (DEXILANT) 60 MG capsule Take 1 capsule (60 mg total) by mouth daily. 01/16/14   Tiffany KocherLeslie S Lewis, PA-C  lamoTRIgine (LAMICTAL) 200 MG tablet Take 200 mg by mouth daily.     Historical Provider, MD  lisinopril-hydrochlorothiazide (PRINZIDE,ZESTORETIC) 20-25 MG per tablet Take 1 tablet by mouth daily.    Historical Provider, MD  oxyCODONE-acetaminophen (PERCOCET/ROXICET) 5-325 MG per tablet Take 1 tablet by mouth every 6 (six) hours as needed for severe pain.    Historical Provider, MD  venlafaxine XR (EFFEXOR-XR) 150 MG 24 hr capsule Take 150 mg by mouth daily with breakfast.    Historical Provider, MD  Wheat Dextrin (BENEFIBER PO) Take by mouth daily.    Historical Provider, MD    ALLERGIES:  Allergies  Allergen Reactions  . Penicillins Other (See Comments)    CHILDHOOD ALLERGY    SOCIAL HISTORY:  History  Substance Use Topics  . Smoking status: Current Every Day Smoker -- 0.50 packs/day for 22 years    Types: Cigarettes  . Smokeless tobacco: Not on file     Comment: 1/2 pack daily  . Alcohol Use: No     Comment: heavy use prior to (04/2013)    FAMILY HISTORY: Family History  Problem Relation Age of Onset  . Colon cancer Neg Hx  EXAM: BP 124/87  Pulse 88  Temp(Src) 98.2 F (36.8 C) (Oral)  Resp 24  Ht 6\' 3"  (1.905 m)  Wt 320 lb (145.151 kg)  BMI 40.00 kg/m2  SpO2 95% CONSTITUTIONAL: Alert and oriented and responds appropriately to questions. Well-appearing; well-nourished HEAD: Normocephalic EYES: Conjunctivae clear, PERRL ENT: normal nose; no rhinorrhea; moist mucous membranes; pharynx without lesions noted NECK: Supple, no meningismus, no LAD  CARD: RRR; S1 and S2 appreciated; no murmurs, no clicks, no rubs, no gallops RESP: Normal chest excursion without splinting or tachypnea; diffuse expiratory wheezing bilaterally, rhonchi; no respiratory distress or hypoxia, speaking  full sentences ABD/GI: Normal bowel sounds; non-distended; soft, non-tender, no rebound, no guarding BACK:  The back appears normal and is non-tender to palpation, there is no CVA tenderness EXT: Normal ROM in all joints; non-tender to palpation; no edema; normal capillary refill; no cyanosis    SKIN: Normal color for age and race; warm NEURO: Moves all extremities equally PSYCH: The patient's mood and manner are appropriate. Grooming and personal hygiene are appropriate.  MEDICAL DECISION MAKING: Patient here with wheezing, productive cough, subjective fevers. Differential diagnosis includes viral upper respiratory infection versus pneumonia. All give breathing treatment, steroids and obtain a chest x-ray. His blood pressure has improved without intervention and he's otherwise asymptomatic. Discussed with patient that his blood pressure may have been elevated because he is not feeling well but have also advised him to you avoid using any over-the-counter medications that contain phenylephrine, dextromethorphan or pseudoephedrine.  ED PROGRESS: chest x-ray is clear. Patient's lungs sound completely clear after one breathing treatment. He reports feeling much better. His blood pressure is stable. I feel he is said to be discharged him. We'll give him a refill for albuterol for his nebulizer machine. He states he has albuterol inhaler at home. Will discharge with prescription for a burst of prednisone. Have advised him to use Afrin, nasal saline over-the-counter as well as guaifenesin.  Discussed return precautions. He verbalize understanding and is comfortable with this plan.  I personally performed the services described in this documentation, which was scribed in my presence. The recorded information has been reviewed and is accurate.     Layla MawKristen N Tryniti Laatsch, DO 09/23/14 [id number]

## 2014-09-23 NOTE — Discharge Instructions (Signed)
You may alternate Tylenol 1000 mg every 6 hours and Ibuprofen 800mg  every 8 hours as needed for fever and pain.  Please do not take medications with dextromethorphan, phenylephrine, pseudoephedrine and them as they may raise your blood pressure. It is safe for you to take over-the-counter guaifenesin (Mucinex) for your cough. You may also use Afrin nasal spray as needed for nasal congestion but do not use this medication for more than 3 days. He may also use saline nasal spray over-the-counter as well.   Upper Respiratory Infection, Adult An upper respiratory infection (URI) is also sometimes known as the common cold. The upper respiratory tract includes the nose, sinuses, throat, trachea, and bronchi. Bronchi are the airways leading to the lungs. Most people improve within 1 week, but symptoms can last up to 2 weeks. A residual cough may last even longer.  CAUSES Many different viruses can infect the tissues lining the upper respiratory tract. The tissues become irritated and inflamed and often become very moist. Mucus production is also common. A cold is contagious. You can easily spread the virus to others by oral contact. This includes kissing, sharing a glass, coughing, or sneezing. Touching your mouth or nose and then touching a surface, which is then touched by another person, can also spread the virus. SYMPTOMS  Symptoms typically develop 1 to 3 days after you come in contact with a cold virus. Symptoms vary from person to person. They may include:  Runny nose.  Sneezing.  Nasal congestion.  Sinus irritation.  Sore throat.  Loss of voice (laryngitis).  Cough.  Fatigue.  Muscle aches.  Loss of appetite.  Headache.  Low-grade fever. DIAGNOSIS  You might diagnose your own cold based on familiar symptoms, since most people get a cold 2 to 3 times a year. Your caregiver can confirm this based on your exam. Most importantly, your caregiver can check that your symptoms are not due  to another disease such as strep throat, sinusitis, pneumonia, asthma, or epiglottitis. Blood tests, throat tests, and X-rays are not necessary to diagnose a common cold, but they may sometimes be helpful in excluding other more serious diseases. Your caregiver will decide if any further tests are required. RISKS AND COMPLICATIONS  You may be at risk for a more severe case of the common cold if you smoke cigarettes, have chronic heart disease (such as heart failure) or lung disease (such as asthma), or if you have a weakened immune system. The very young and very old are also at risk for more serious infections. Bacterial sinusitis, middle ear infections, and bacterial pneumonia can complicate the common cold. The common cold can worsen asthma and chronic obstructive pulmonary disease (COPD). Sometimes, these complications can require emergency medical care and may be life-threatening. PREVENTION  The best way to protect against getting a cold is to practice good hygiene. Avoid oral or hand contact with people with cold symptoms. Wash your hands often if contact occurs. There is no clear evidence that vitamin C, vitamin E, echinacea, or exercise reduces the chance of developing a cold. However, it is always recommended to get plenty of rest and practice good nutrition. TREATMENT  Treatment is directed at relieving symptoms. There is no cure. Antibiotics are not effective, because the infection is caused by a virus, not by bacteria. Treatment may include:  Increased fluid intake. Sports drinks offer valuable electrolytes, sugars, and fluids.  Breathing heated mist or steam (vaporizer or shower).  Eating chicken soup or other clear broths, and  maintaining good nutrition.  Getting plenty of rest.  Using gargles or lozenges for comfort.  Controlling fevers with ibuprofen or acetaminophen as directed by your caregiver.  Increasing usage of your inhaler if you have asthma. Zinc gel and zinc lozenges,  taken in the first 24 hours of the common cold, can shorten the duration and lessen the severity of symptoms. Pain medicines may help with fever, muscle aches, and throat pain. A variety of non-prescription medicines are available to treat congestion and runny nose. Your caregiver can make recommendations and may suggest nasal or lung inhalers for other symptoms.  HOME CARE INSTRUCTIONS   Only take over-the-counter or prescription medicines for pain, discomfort, or fever as directed by your caregiver.  Use a warm mist humidifier or inhale steam from a shower to increase air moisture. This may keep secretions moist and make it easier to breathe.  Drink enough water and fluids to keep your urine clear or pale yellow.  Rest as needed.  Return to work when your temperature has returned to normal or as your caregiver advises. You may need to stay home longer to avoid infecting others. You can also use a face mask and careful hand washing to prevent spread of the virus. SEEK MEDICAL CARE IF:   After the first few days, you feel you are getting worse rather than better.  You need your caregiver's advice about medicines to control symptoms.  You develop chills, worsening shortness of breath, or brown or red sputum. These may be signs of pneumonia.  You develop yellow or brown nasal discharge or pain in the face, especially when you bend forward. These may be signs of sinusitis.  You develop a fever, swollen neck glands, pain with swallowing, or white areas in the back of your throat. These may be signs of strep throat. SEEK IMMEDIATE MEDICAL CARE IF:   You have a fever.  You develop severe or persistent headache, ear pain, sinus pain, or chest pain.  You develop wheezing, a prolonged cough, cough up blood, or have a change in your usual mucus (if you have chronic lung disease).  You develop sore muscles or a stiff neck. Document Released: 05/11/2001 Document Revised: 02/07/2012 Document  Reviewed: 02/20/2014 [facility] Patient Information 2015 [place], [place]. This information is not intended to replace advice given to you by your health care provider. Make sure you discuss any questions you have with your health care provider.   Bronchospasm A bronchospasm is when the tubes that carry air in and out of your lungs (airways) spasm or tighten. During a bronchospasm it is hard to breathe. This is because the airways get smaller. A bronchospasm can be triggered by:  Allergies. These may be to animals, pollen, food, or mold.  Infection. This is a common cause of bronchospasm.  Exercise.  Irritants. These include pollution, cigarette smoke, strong odors, aerosol sprays, and paint fumes.  Weather changes.  Stress.  Being emotional. HOME CARE   Always have a plan for getting help. Know when to call your doctor and local emergency services (911 in the [place] Know where you can get emergency care.  Only take medicines as told by your doctor.  If you were prescribed an inhaler or nebulizer machine, ask your doctor how to use it correctly. Always use a spacer with your inhaler if you were given one.  Stay calm during an attack. Try to relax and breathe more slowly.  Control your home environment:  Change your heating and air conditioning  filter at least once a month.  Limit your use of fireplaces and wood stoves.  Do not  smoke. Do not  allow smoking in your home.  Avoid perfumes and fragrances.  Get rid of pests (such as roaches and mice) and their droppings.  Throw away plants if you see mold on them.  Keep your house clean and dust free.  Replace carpet with wood, tile, or vinyl flooring. Carpet can trap dander and dust.  Use allergy-proof pillows, mattress covers, and box spring covers.  Wash bed sheets and blankets every week in hot water. Dry them in a dryer.  Use blankets that are made of polyester or cotton.  Wash hands frequently. GET HELP IF:  You  have muscle aches.  You have chest pain.  The thick spit you spit or cough up (sputum) changes from clear or white to yellow, green, gray, or bloody.  The thick spit you spit or cough up gets thicker.  There are problems that may be related to the medicine you are given such as:  A rash.  Itching.  Swelling.  Trouble breathing. GET HELP RIGHT AWAY IF:  You feel you cannot breathe or catch your breath.  You cannot stop coughing.  Your treatment is not helping you breathe better.  You have very bad chest pain. MAKE SURE YOU:   Understand these instructions.  Will watch your condition.  Will get help right away if you are not doing well or get worse. Document Released: 09/12/2009 Document Revised: 11/20/2013 Document Reviewed: 05/08/2013 [facility] Patient Information 2015 [place], [place]. This information is not intended to replace advice given to you by your health care provider. Make sure you discuss any questions you have with your health care provider.

## 2014-09-23 NOTE — ED Notes (Signed)
Patient with c/o cough, congestion for several days. HTN complaint since this morning. States he is dizzy. Has been taking OTC nyquil/dayquil. Does ambulate with steady gait.

## 2015-05-15 ENCOUNTER — Emergency Department (HOSPITAL_COMMUNITY)
Admission: EM | Admit: 2015-05-15 | Discharge: 2015-05-15 | Disposition: A | Discharge status: Home / Self Care | Payer: Medicaid Other | Attending: Emergency Medicine | Admitting: Emergency Medicine

## 2015-05-15 ENCOUNTER — Encounter (HOSPITAL_COMMUNITY): Payer: Self-pay | Admitting: Cardiology

## 2015-05-15 DIAGNOSIS — Z79899 Other long term (current) drug therapy: Secondary | ICD-10-CM | POA: Diagnosis not present

## 2015-05-15 DIAGNOSIS — J45909 Unspecified asthma, uncomplicated: Secondary | ICD-10-CM | POA: Diagnosis not present

## 2015-05-15 DIAGNOSIS — Z8639 Personal history of other endocrine, nutritional and metabolic disease: Secondary | ICD-10-CM | POA: Insufficient documentation

## 2015-05-15 DIAGNOSIS — Z720 Tobacco use: Secondary | ICD-10-CM | POA: Diagnosis not present

## 2015-05-15 DIAGNOSIS — I10 Essential (primary) hypertension: Secondary | ICD-10-CM | POA: Diagnosis not present

## 2015-05-15 DIAGNOSIS — Z8739 Personal history of other diseases of the musculoskeletal system and connective tissue: Secondary | ICD-10-CM | POA: Insufficient documentation

## 2015-05-15 DIAGNOSIS — N4889 Other specified disorders of penis: Secondary | ICD-10-CM | POA: Diagnosis not present

## 2015-05-15 DIAGNOSIS — Z8701 Personal history of pneumonia (recurrent): Secondary | ICD-10-CM | POA: Diagnosis not present

## 2015-05-15 DIAGNOSIS — Z872 Personal history of diseases of the skin and subcutaneous tissue: Secondary | ICD-10-CM | POA: Insufficient documentation

## 2015-05-15 DIAGNOSIS — Z8719 Personal history of other diseases of the digestive system: Secondary | ICD-10-CM | POA: Insufficient documentation

## 2015-05-15 DIAGNOSIS — Z880 Allergy status to penicillin: Secondary | ICD-10-CM | POA: Diagnosis not present

## 2015-05-15 MED ORDER — LIDOCAINE HCL (PF) 1 % IJ SOLN
INTRAMUSCULAR | Status: AC
  Filled 2015-05-15: qty 5

## 2015-05-15 MED ORDER — CEFTRIAXONE SODIUM 1 G IJ SOLR
1.0000 g | Freq: Once | INTRAMUSCULAR | Status: AC
  Administered 2015-05-15: 1 g via INTRAMUSCULAR
  Filled 2015-05-15: qty 10

## 2015-05-15 MED ORDER — CEPHALEXIN 500 MG PO CAPS: 500.0000 mg | ORAL_CAPSULE | Freq: Four times a day (QID) | ORAL | Status: DC

## 2015-05-15 NOTE — ED Notes (Signed)
Tick removed Monday from  penis.  Seen at [facility] hospital yesterday for swelling.  States his swelling is worse.  Pt diaphoretic.

## 2015-05-15 NOTE — ED Notes (Signed)
Patient given discharge instruction, verbalized understand. Patient ambulatory out of the department.  

## 2015-05-15 NOTE — ED Provider Notes (Signed)
CSN: [id number]     Arrival date & time 05/15/15  1135 History  This patient was seen in room APA14/APA14 and the patient's care was started at 7:17 AM.     Chief Complaint  Patient presents with  . Tick Removal   The history is provided by the patient. No language interpreter was used.    HPI Comments: Ethan [name] is a 44 y.o. male who presents to the Emergency Department complaining of swelling of the penile shaft, onset 2 days ago, occurred shortly after removing a tick from the proximal penile shaft. He states it was a very small insect, he used tweezers, he recovered the entire tick,  but noted gradual onset of swelling to the penile shaft, this has been persistent, moderate, he was seen at the emergency department last night at another hospital during which visit he was prescribed doxycycline. He denies fevers or chills, no nausea or vomiting, he has been able to urinate without difficulty despite the swelling of his penile shaft.   Past Medical History  Diagnosis Date  . Hypertension   . Asthma   . Hypokalemia   . Diverticulitis     06/2012  . Cellulitis     hospitalized 07/2013  . Gout   . Pneumonia    Past Surgical History  Procedure Laterality Date  . Arm wound repair / closure    . Esophagogastroduodenoscopy N/A 11/14/2013    [id number] hernia; otherwise negative examination  . Wrist surgery  12/2013    right   Family History  Problem Relation Age of Onset  . Colon cancer Neg Hx    History  Substance Use Topics  . Smoking status: Current Every Day Smoker -- 0.50 packs/day for 22 years    Types: Cigarettes  . Smokeless tobacco: Not on file     Comment: 1/2 pack daily  . Alcohol Use: No     Comment: heavy use prior to (04/2013)    Review of Systems  Genitourinary: Positive for penile swelling. Negative for hematuria, discharge, scrotal swelling and testicular pain.  Skin: Positive for rash.  All other systems reviewed and are negative.  Allergies   Penicillins  Home Medications   Prior to Admission medications   Medication Sig Start Date End Date Taking? Authorizing Provider  albuterol (PROVENTIL HFA;VENTOLIN HFA) 108 (90 BASE) MCG/ACT inhaler Inhale 2 puffs into the lungs every 6 (six) hours as needed for wheezing or shortness of breath.   Yes Historical Provider, MD  albuterol (PROVENTIL) (2.5 MG/3ML) 0.083% nebulizer solution Take 3 mLs (2.5 mg total) by nebulization every 6 (six) hours as needed for wheezing or shortness of breath. 09/23/14  Yes Kristen N Ward, DO  ALPRAZolam (XANAX) 1 MG tablet Take 1 mg by mouth 4 (four) times daily as needed for anxiety.    Yes Historical Provider, MD  amLODipine (NORVASC) 10 MG tablet Take 10 mg by mouth daily.   Yes Historical Provider, MD  dexlansoprazole (DEXILANT) 60 MG capsule Take 1 capsule (60 mg total) by mouth daily. 01/16/14  Yes Tiffany Kocher, PA-C  lisinopril-hydrochlorothiazide (PRINZIDE,ZESTORETIC) 20-25 MG per tablet Take 1 tablet by mouth daily.   Yes Historical Provider, MD  Multiple Vitamin (MULTIVITAMIN WITH MINERALS) TABS tablet Take 1 tablet by mouth daily.   Yes Historical Provider, MD  oxyCODONE-acetaminophen (PERCOCET/ROXICET) 5-325 MG per tablet Take 1 tablet by mouth every 6 (six) hours as needed for severe pain.   Yes Historical Provider, MD  Wheat Dextrin (BENEFIBER PO) Take  by mouth daily.   Yes Historical Provider, MD  cephALEXin (KEFLEX) 500 MG capsule Take 1 capsule (500 mg total) by mouth 4 (four) times daily. 05/15/15   Eber Hong, MD   Triage VS: BP 152/94 mmHg  Pulse 100  Temp(Src) 98 F (36.7 C) (Oral)  Resp 20  Ht 6' (1.829 m)  Wt 310 lb (140.615 kg)  BMI 42.03 kg/m2  SpO2 99% Physical Exam  Constitutional: He appears well-developed and well-nourished.  HENT:  Head: Normocephalic and atraumatic.  Eyes: Conjunctivae are normal. Right eye exhibits no discharge. Left eye exhibits no discharge.  Pulmonary/Chest: Effort normal. No respiratory distress.   Abdominal:  Abdomen is soft and nontender diffusely  Genitourinary:  Small area of tiny ulcer at the base of the penile shaft on the left, circumferential edema, no induration or erythema of the penile shaft, urethral orifice is open, patent, no swelling of the testicles or scrotum  Neurological: He is alert. Coordination normal.  Skin: Skin is warm and dry. No rash noted. He is not diaphoretic. No erythema.  Psychiatric: He has a normal mood and affect.  Nursing note and vitals reviewed.  ED Course  Procedures (including critical care time) DIAGNOSTIC STUDIES: Oxygen Saturation is 99% on RA, normal by my interpretation.    COORDINATION OF CARE: 7:17 AM Discussed treatment plan with pt at bedside and pt agreed to plan.   Labs Review Labs Reviewed - No data to display  Imaging Review No results found.   MDM   Final diagnoses:  Swelling, penis    The patient has been prescribed doxycycline, he has not yet filled this prescription, I will add Rocephin here and prescribed Keflex, anticipate that this is likely related to the tick bite. Less likely this would be related to the patient's blood pressure medication where he has what could be also mild angioedema, I have recommended that he be followed up in 2 days to make sure this has not worsened, he is agreeable  Meds given in ED:  Medications  cefTRIAXone (ROCEPHIN) injection 1 g (1 g Intramuscular Given 05/15/15 1213)    Discharge Medication List as of 05/15/2015 12:11 PM    START taking these medications   Details  cephALEXin (KEFLEX) 500 MG capsule Take 1 capsule (500 mg total) by mouth 4 (four) times daily., Starting 05/15/2015, Until Discontinued, Print         Eber Hong, MD 05/17/15 [phone]

## 2015-05-15 NOTE — Discharge Instructions (Signed)
Please call your doctor for a followup appointment within 24-48 hours. When you talk to your doctor please let them know that you were seen in the emergency department and have them acquire all of your records so that they can discuss the findings with you and formulate a treatment plan to fully care for your new and ongoing problems. ° °

## 2015-05-16 ENCOUNTER — Other Ambulatory Visit: Payer: Self-pay | Admitting: Gastroenterology

## 2015-05-16 NOTE — Telephone Encounter (Signed)
Please contact patient and ask if he's requesting this refill (otherwise it may be an automated thing). It looks like it was d/c'd at last visit, possibly because he wasn't taking it. Let me know if it needs to be filled. Thanks

## 2015-05-19 NOTE — Telephone Encounter (Signed)
Pt's phone number does not work 

## 2015-05-22 NOTE — Telephone Encounter (Signed)
Does not need refilled

## 2015-06-02 ENCOUNTER — Encounter (HOSPITAL_COMMUNITY): Payer: Self-pay | Admitting: Emergency Medicine

## 2015-06-02 ENCOUNTER — Emergency Department (HOSPITAL_COMMUNITY): Payer: Medicaid Other

## 2015-06-02 ENCOUNTER — Emergency Department (HOSPITAL_COMMUNITY)
Admission: EM | Admit: 2015-06-02 | Discharge: 2015-06-02 | Disposition: A | Discharge status: Home / Self Care | Payer: Medicaid Other | Attending: Emergency Medicine | Admitting: Emergency Medicine

## 2015-06-02 DIAGNOSIS — Z8719 Personal history of other diseases of the digestive system: Secondary | ICD-10-CM | POA: Insufficient documentation

## 2015-06-02 DIAGNOSIS — Z880 Allergy status to penicillin: Secondary | ICD-10-CM | POA: Insufficient documentation

## 2015-06-02 DIAGNOSIS — L03113 Cellulitis of right upper limb: Secondary | ICD-10-CM | POA: Diagnosis not present

## 2015-06-02 DIAGNOSIS — Y9289 Other specified places as the place of occurrence of the external cause: Secondary | ICD-10-CM | POA: Insufficient documentation

## 2015-06-02 DIAGNOSIS — I10 Essential (primary) hypertension: Secondary | ICD-10-CM | POA: Insufficient documentation

## 2015-06-02 DIAGNOSIS — Z720 Tobacco use: Secondary | ICD-10-CM | POA: Insufficient documentation

## 2015-06-02 DIAGNOSIS — Z8639 Personal history of other endocrine, nutritional and metabolic disease: Secondary | ICD-10-CM | POA: Insufficient documentation

## 2015-06-02 DIAGNOSIS — J45909 Unspecified asthma, uncomplicated: Secondary | ICD-10-CM | POA: Diagnosis not present

## 2015-06-02 DIAGNOSIS — S62332A Displaced fracture of neck of third metacarpal bone, right hand, initial encounter for closed fracture: Secondary | ICD-10-CM | POA: Insufficient documentation

## 2015-06-02 DIAGNOSIS — S6991XA Unspecified injury of right wrist, hand and finger(s), initial encounter: Secondary | ICD-10-CM | POA: Diagnosis present

## 2015-06-02 DIAGNOSIS — Z872 Personal history of diseases of the skin and subcutaneous tissue: Secondary | ICD-10-CM | POA: Insufficient documentation

## 2015-06-02 DIAGNOSIS — Y998 Other external cause status: Secondary | ICD-10-CM | POA: Insufficient documentation

## 2015-06-02 DIAGNOSIS — Z79899 Other long term (current) drug therapy: Secondary | ICD-10-CM | POA: Insufficient documentation

## 2015-06-02 DIAGNOSIS — S62339A Displaced fracture of neck of unspecified metacarpal bone, initial encounter for closed fracture: Secondary | ICD-10-CM

## 2015-06-02 DIAGNOSIS — Z8701 Personal history of pneumonia (recurrent): Secondary | ICD-10-CM | POA: Insufficient documentation

## 2015-06-02 DIAGNOSIS — Y9371 Activity, boxing: Secondary | ICD-10-CM | POA: Diagnosis not present

## 2015-06-02 DIAGNOSIS — W228XXA Striking against or struck by other objects, initial encounter: Secondary | ICD-10-CM | POA: Insufficient documentation

## 2015-06-02 DIAGNOSIS — Z8739 Personal history of other diseases of the musculoskeletal system and connective tissue: Secondary | ICD-10-CM | POA: Diagnosis not present

## 2015-06-02 LAB — CBC WITH DIFFERENTIAL/PLATELET
Basophils Absolute: 0 10*3/uL (ref 0.0–0.1)
Basophils Relative: 0 % (ref 0–1)
Eosinophils Absolute: 0.1 10*3/uL (ref 0.0–0.7)
Eosinophils Relative: 1 % (ref 0–5)
HCT: 45 % (ref 39.0–52.0)
Hemoglobin: 15.4 g/dL (ref 13.0–17.0)
Lymphocytes Relative: 24 % (ref 12–46)
Lymphs Abs: 2.3 10*3/uL (ref 0.7–4.0)
MCH: 32.9 pg (ref 26.0–34.0)
MCHC: 34.2 g/dL (ref 30.0–36.0)
MCV: 96.2 fL (ref 78.0–100.0)
Monocytes Absolute: 0.6 10*3/uL (ref 0.1–1.0)
Monocytes Relative: 6 % (ref 3–12)
Neutro Abs: 6.8 10*3/uL (ref 1.7–7.7)
Neutrophils Relative %: 69 % (ref 43–77)
Platelets: 284 10*3/uL (ref 150–400)
RBC: 4.68 MIL/uL (ref 4.22–5.81)
RDW: 13.6 % (ref 11.5–15.5)
WBC: 9.8 10*3/uL (ref 4.0–10.5)

## 2015-06-02 LAB — BASIC METABOLIC PANEL
Anion gap: 12 (ref 5–15)
BUN: 13 mg/dL (ref 6–20)
CO2: 20 mmol/L — ABNORMAL LOW (ref 22–32)
Calcium: 9.1 mg/dL (ref 8.9–10.3)
Chloride: 100 mmol/L — ABNORMAL LOW (ref 101–111)
Creatinine, Ser: 1.42 mg/dL — ABNORMAL HIGH (ref 0.61–1.24)
GFR calc Af Amer: >60 mL/min (ref >60)
GFR calc non Af Amer: 60 mL/min — ABNORMAL LOW (ref >60)
Glucose, Bld: 164 mg/dL — ABNORMAL HIGH (ref 65–99)
Potassium: 4.3 mmol/L (ref 3.5–5.1)
Sodium: 132 mmol/L — ABNORMAL LOW (ref 135–145)

## 2015-06-02 MED ORDER — SULFAMETHOXAZOLE-TRIMETHOPRIM 800-160 MG PO TABS: 1.0000 | ORAL_TABLET | Freq: Two times a day (BID) | ORAL | Status: AC

## 2015-06-02 MED ORDER — SULFAMETHOXAZOLE-TRIMETHOPRIM 800-160 MG PO TABS
1.0000 | ORAL_TABLET | Freq: Once | ORAL | Status: AC
  Administered 2015-06-02: 1 via ORAL
  Filled 2015-06-02: qty 1

## 2015-06-02 MED ORDER — CLINDAMYCIN HCL 150 MG PO CAPS
300.0000 mg | ORAL_CAPSULE | Freq: Once | ORAL | Status: AC
  Administered 2015-06-02: 300 mg via ORAL
  Filled 2015-06-02: qty 2

## 2015-06-02 MED ORDER — CLINDAMYCIN HCL 300 MG PO CAPS: 300.0000 mg | ORAL_CAPSULE | Freq: Four times a day (QID) | ORAL | Status: DC

## 2015-06-02 MED ORDER — HYDROCODONE-ACETAMINOPHEN 5-325 MG PO TABS
1.0000 | ORAL_TABLET | Freq: Once | ORAL | Status: AC
  Administered 2015-06-02: 1 via ORAL
  Filled 2015-06-02: qty 1

## 2015-06-02 MED ORDER — HYDROCODONE-ACETAMINOPHEN 5-325 MG PO TABS: 1.0000 | ORAL_TABLET | ORAL | Status: DC | PRN
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
## 2015-06-02 NOTE — ED Notes (Signed)
Pt reports he was play boxing with a friend on Fri, accidentally hit someone in the mouth. Pt states his hand was ok until Sat am. Pt reports he woke up with a large blister on his hand and has developed edema that will decrease during the day but is worse when he awakens.

## 2015-06-02 NOTE — ED Notes (Signed)
Patient with no complaints at this time. Respirations even and unlabored. Skin warm/dry. Discharge instructions reviewed with patient at this time. Patient given opportunity to voice concerns/ask questions. Patient discharged at this time and left Emergency Department with steady gait.   

## 2015-06-02 NOTE — ED Notes (Signed)
Patient soaking right hand in normal saline and betadine mix per verbal order from Burgess AmorJulie Idol, [place]. Patient tolerating well.

## 2015-06-02 NOTE — Discharge Instructions (Signed)
Boxer's Fracture You have a break (fracture) of the fifth metacarpal bone. This is commonly called a boxer's fracture. This is the bone in the hand where the little finger attaches. The fracture is in the end of that bone, closest to the little finger. It is usually caused when you hit an object with a clenched fist. Often, the knuckle is pushed down by the impact. Sometimes, the fracture rotates out of position. A boxer's fracture will usually heal within 6 weeks, if it is treated properly and protected from re-injury. Surgery is sometimes needed. A cast, splint, or bulky hand dressing may be used to protect and immobilize a boxer's fracture. Do not remove this device or dressing until your caregiver approves. Keep your hand elevated, and apply ice packs for 15-20 minutes every 2 hours, for the first 2 days. Elevation and ice help reduce swelling and relieve pain. See your caregiver, or an orthopedic specialist, for follow-up care within the next 10 days. This is to make sure your fracture is healing properly. Document Released: 11/15/2005 Document Revised: 02/07/2012 Document Reviewed: 05/05/2007 [facility] Patient Information 2015 [place], [place]. This information is not intended to replace advice given to you by your health care provider. Make sure you discuss any questions you have with your health care provider.  Cellulitis Cellulitis is an infection of the skin and the tissue beneath it. The infected area is usually red and tender. Cellulitis occurs most often in the arms and lower legs.  CAUSES  Cellulitis is caused by bacteria that enter the skin through cracks or cuts in the skin. The most common types of bacteria that cause cellulitis are staphylococci and streptococci. SIGNS AND SYMPTOMS   Redness and warmth.  Swelling.  Tenderness or pain.  Fever. DIAGNOSIS  Your health care provider can usually determine what is wrong based on a physical exam. Blood tests may also be done. TREATMENT    Treatment usually involves taking an antibiotic medicine. HOME CARE INSTRUCTIONS   Take your antibiotic medicine as directed by your health care provider. Finish the antibiotic even if you start to feel better.  Keep the infected arm or leg elevated to reduce swelling.  Apply a warm cloth to the affected area up to 4 times per day to relieve pain.  Take medicines only as directed by your health care provider.  Keep all follow-up visits as directed by your health care provider. SEEK MEDICAL CARE IF:   You notice red streaks coming from the infected area.  Your red area gets larger or turns dark in color.  Your bone or joint underneath the infected area becomes painful after the skin has healed.  Your infection returns in the same area or another area.  You notice a swollen bump in the infected area.  You develop new symptoms.  You have a fever. SEEK IMMEDIATE MEDICAL CARE IF:   You feel very sleepy.  You develop vomiting or diarrhea.  You have a general ill feeling (malaise) with muscle aches and pains. MAKE SURE YOU:   Understand these instructions.  Will watch your condition.  Will get help right away if you are not doing well or get worse. Document Released: 08/25/2005 Document Revised: 04/01/2014 Document Reviewed: 01/31/2012 [facility]ExitCare Patient Information 2015 [place], [place]LL[place]. This information is not intended to replace advice given to you by your health care provider. Make sure you discuss any questions you have with your health care provider.

## 2015-06-06 ENCOUNTER — Ambulatory Visit: Payer: Medicaid Other | Admitting: Occupational Therapy

## 2015-06-18 ENCOUNTER — Telehealth: Payer: Self-pay | Admitting: Occupational Therapy

## 2015-06-18 ENCOUNTER — Ambulatory Visit: Payer: Medicaid Other | Attending: Orthopedic Surgery | Admitting: Occupational Therapy

## 2015-06-18 NOTE — Telephone Encounter (Signed)
Therapist spoke with Huntley DecSara, Dr. Merrilee SeashoreKuzma's assistant, to make her aware that Mr. Jenelle MagesHairston cancelled his last appointment at the last minute and no showed for today's appointment. Huntley DecSara was made aware that pt has not had a splint fabricated. Huntley DecSara said she would make a note in the pt's chart.

## 2015-06-29 NOTE — Progress Notes (Signed)
REVIEWED-NO ADDITIONAL RECOMMENDATIONS. 

## 2015-08-11 ENCOUNTER — Other Ambulatory Visit (HOSPITAL_COMMUNITY): Payer: Self-pay | Admitting: Internal Medicine

## 2015-08-11 DIAGNOSIS — R066 Hiccough: Secondary | ICD-10-CM

## 2015-08-11 DIAGNOSIS — Z1389 Encounter for screening for other disorder: Secondary | ICD-10-CM

## 2015-08-18 ENCOUNTER — Ambulatory Visit (HOSPITAL_COMMUNITY)
Admission: RE | Admit: 2015-08-18 | Discharge: 2015-08-18 | Disposition: A | Discharge status: Home / Self Care | Payer: Medicaid Other | Source: Ambulatory Visit | Attending: Internal Medicine | Admitting: Internal Medicine

## 2015-08-18 ENCOUNTER — Ambulatory Visit (HOSPITAL_COMMUNITY): Payer: Medicaid Other

## 2015-08-18 DIAGNOSIS — K224 Dyskinesia of esophagus: Secondary | ICD-10-CM | POA: Diagnosis not present

## 2015-08-18 DIAGNOSIS — R066 Hiccough: Secondary | ICD-10-CM | POA: Insufficient documentation

## 2015-08-18 DIAGNOSIS — Z1389 Encounter for screening for other disorder: Secondary | ICD-10-CM

## 2015-08-19 ENCOUNTER — Ambulatory Visit (HOSPITAL_COMMUNITY): Payer: Medicaid Other

## 2015-08-21 ENCOUNTER — Ambulatory Visit (HOSPITAL_COMMUNITY): Payer: Medicaid Other

## 2015-08-25 ENCOUNTER — Ambulatory Visit (HOSPITAL_COMMUNITY): Payer: Medicaid Other

## 2015-08-28 ENCOUNTER — Other Ambulatory Visit (HOSPITAL_COMMUNITY): Payer: Self-pay | Admitting: Internal Medicine

## 2015-08-28 DIAGNOSIS — R066 Hiccough: Secondary | ICD-10-CM

## 2015-08-29 ENCOUNTER — Ambulatory Visit (HOSPITAL_COMMUNITY)
Admission: RE | Admit: 2015-08-29 | Discharge: 2015-08-29 | Disposition: A | Discharge status: Home / Self Care | Payer: Medicaid Other | Source: Ambulatory Visit | Attending: Gastroenterology | Admitting: Gastroenterology

## 2015-09-02 ENCOUNTER — Ambulatory Visit (HOSPITAL_COMMUNITY): Admission: RE | Admit: 2015-09-02 | Payer: Self-pay | Source: Ambulatory Visit

## 2016-01-27 IMAGING — RF DG ESOPHAGUS
19 of 24 series · 19 of 24 positions shown · non-contrast
Comparison: None.

CLINICAL DATA: Chronic kicked ups.

EXAM:
ESOPHOGRAM / BARIUM SWALLOW / BARIUM TABLET STUDY
TECHNIQUE: Combined double contrast and single contrast examination performed
using effervescent crystals, thick barium liquid, and thin barium
liquid. The patient was observed with fluoroscopy swallowing a 13 mm
barium sulphate tablet.
FLUOROSCOPY TIME:  Radiation Exposure Index (as provided by the
fluoroscopic device):
If the device does not provide the exposure index:
Fluoroscopy Time:  2 minutes
Number of Acquired Images:  0

[Series 1: run · 1 of 1 slices shown (1 of 19)]
[im 1/1]
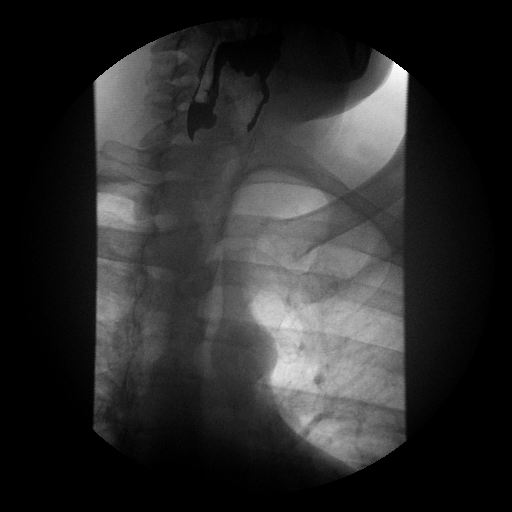

[Series 2: run · 1 of 1 slices shown (2 of 19)]
[im 1/1]
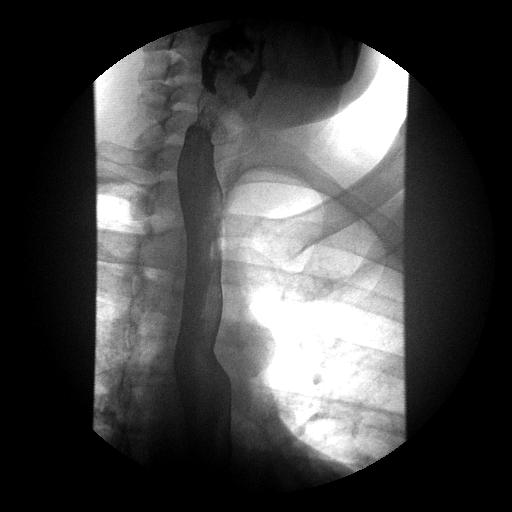

[Series 4: run · 1 of 1 slices shown (3 of 19)]
[im 1/1]
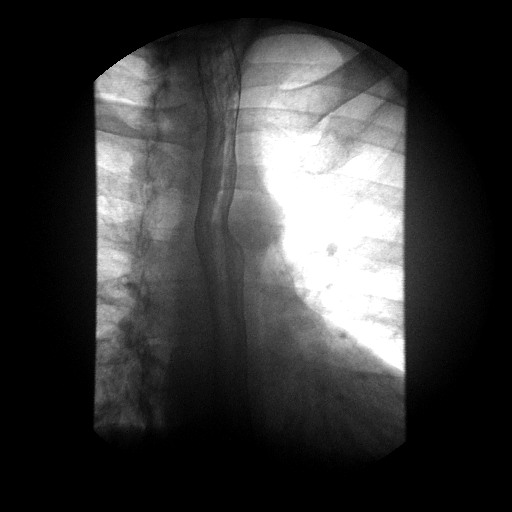

[Series 5: run · 1 of 1 slices shown (4 of 19)]
[im 1/1]
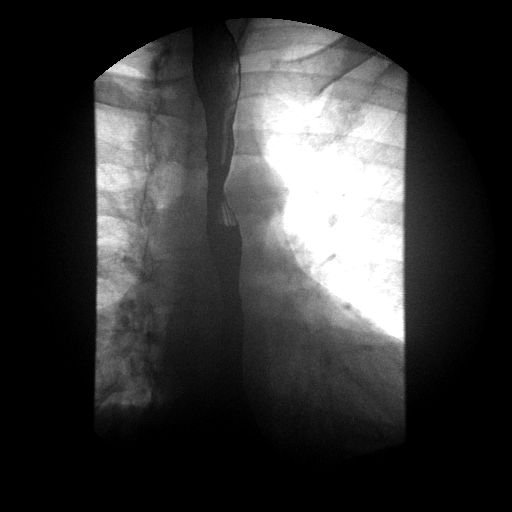

[Series 6: run · 1 of 1 slices shown (5 of 19)]
[im 1/1]
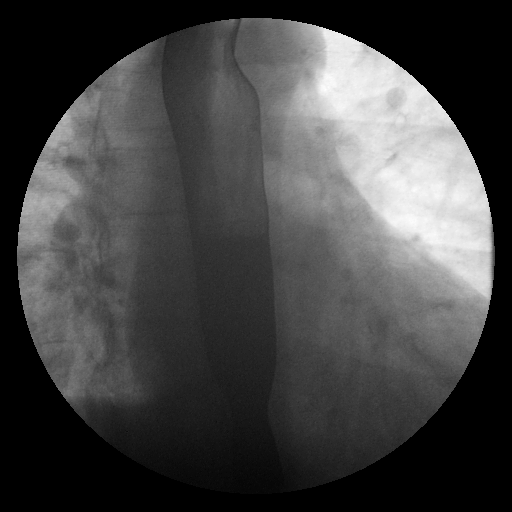

[Series 7: run · 1 of 1 slices shown (6 of 19)]
[im 1/1]
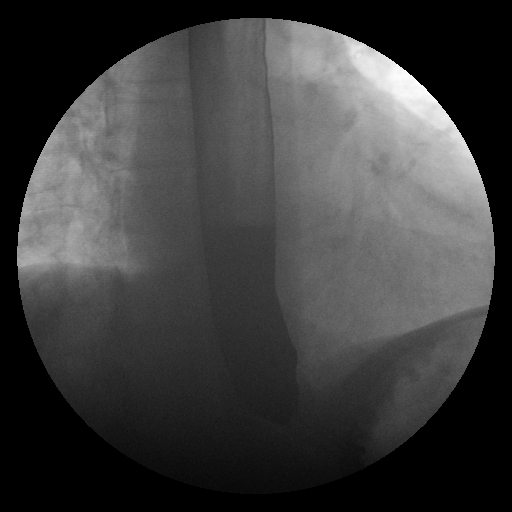

[Series 9: run · 1 of 1 slices shown (7 of 19)]
[im 1/1]
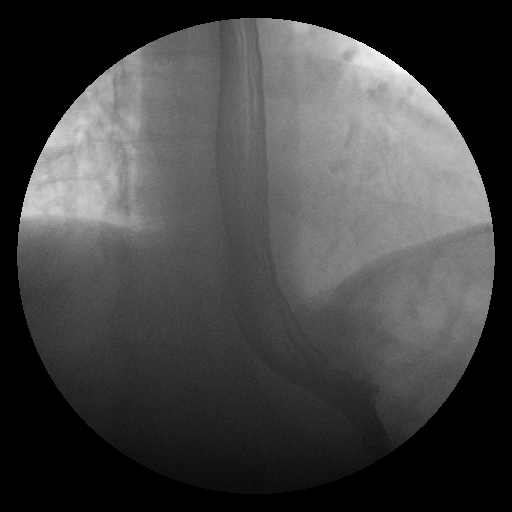

[Series 10: run · 1 of 1 slices shown (8 of 19)]
[im 1/1]
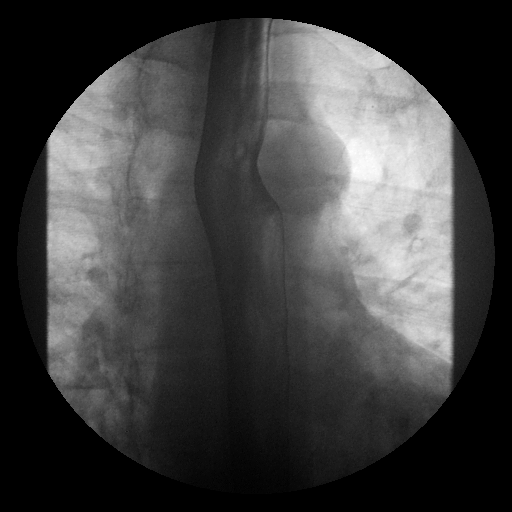

[Series 11: run · 1 of 1 slices shown (9 of 19)]
[im 1/1]
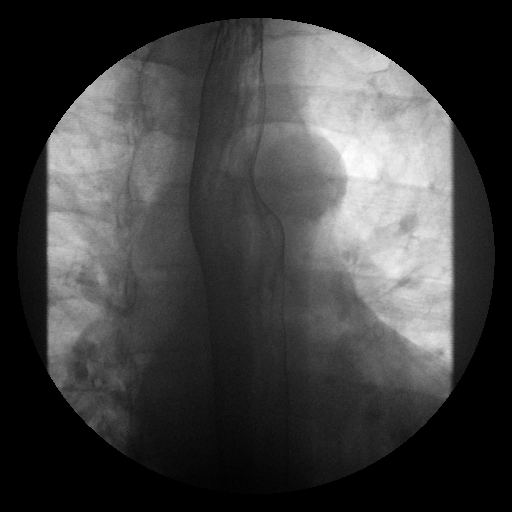

[Series 13: run · 1 of 1 slices shown (10 of 19)]
[im 1/1]
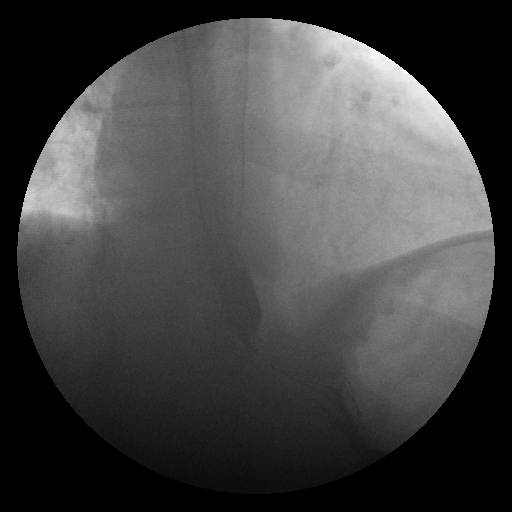

[Series 14: run · 1 of 1 slices shown (11 of 19)]
[im 1/1]
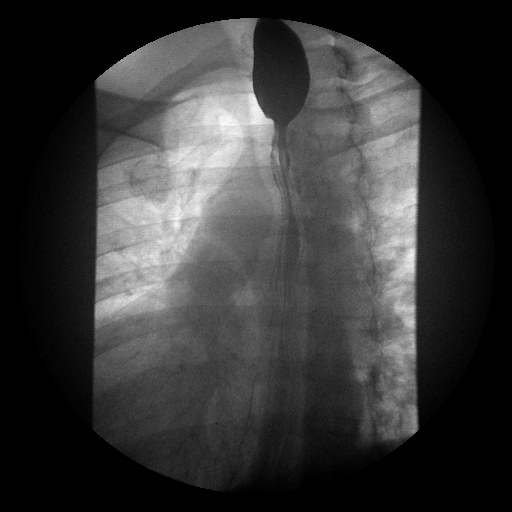

[Series 15: run · 1 of 1 slices shown (12 of 19)]
[im 1/1]
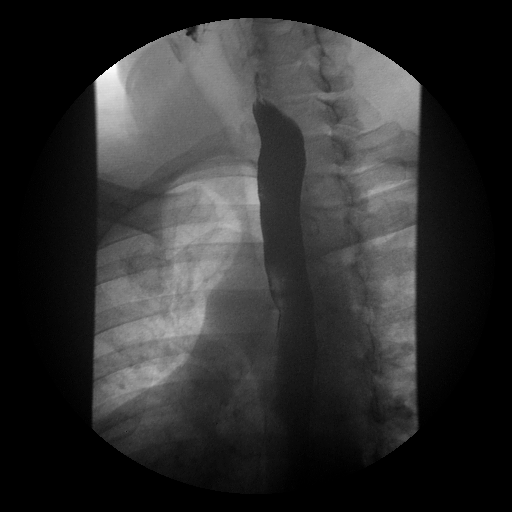

[Series 16: run · 1 of 1 slices shown (13 of 19)]
[im 1/1]
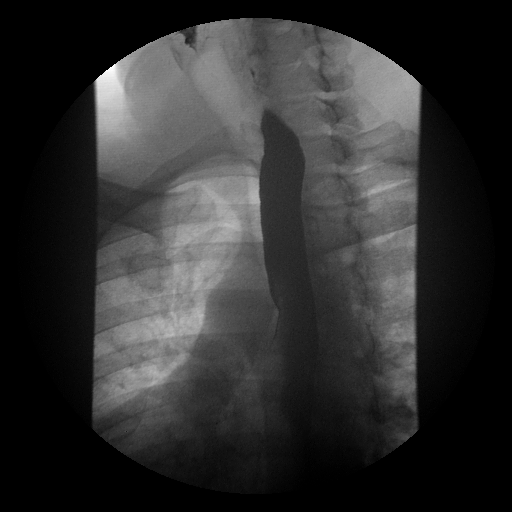

[Series 18: run · 1 of 1 slices shown (14 of 19)]
[im 1/1]
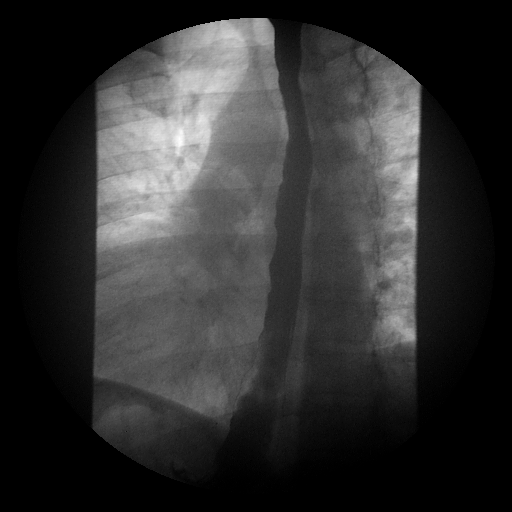

[Series 19: run · 1 of 1 slices shown (15 of 19)]
[im 1/1]
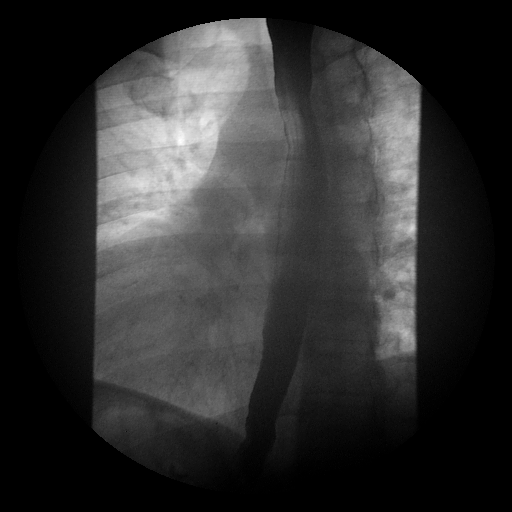

[Series 20: run · 1 of 1 slices shown (16 of 19)]
[im 1/1]
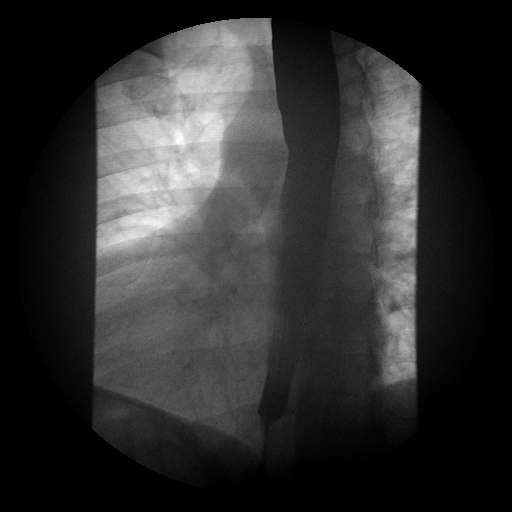

[Series 21: run · 1 of 1 slices shown (17 of 19)]
[im 1/1]
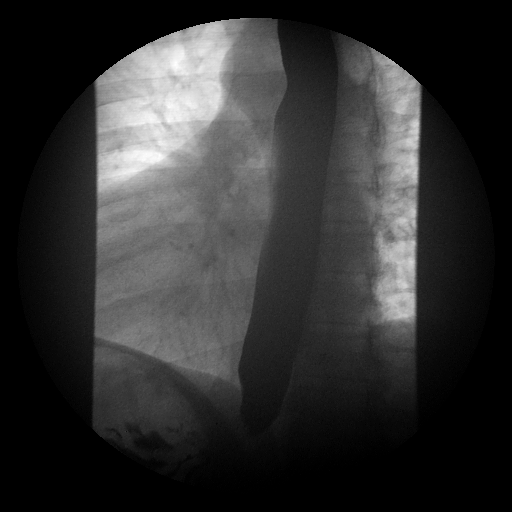

[Series 23: run · 1 of 1 slices shown (18 of 19)]
[im 1/1]
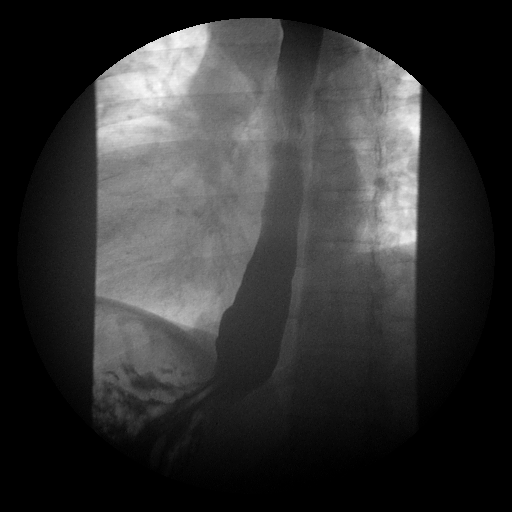

[Series 24: run · 1 of 1 slices shown (19 of 19)]
[im 1/1]
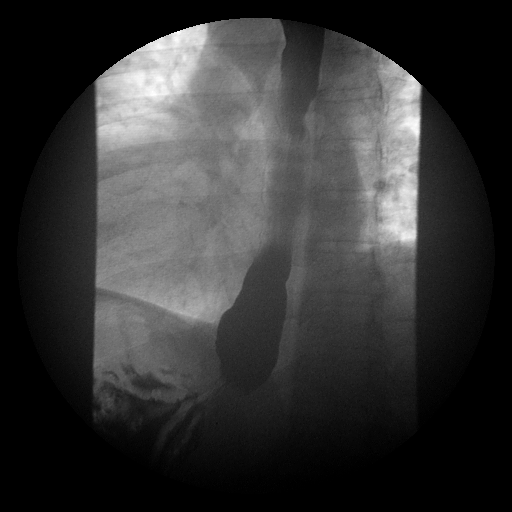

[19 of 24 positions shown; findings below may reference images not displayed]

FINDINGS: Fluoroscopic evaluation of swallowing demonstrates disruption of all
primary peristaltic waves with proximal escape. Occasional distal
tertiary contractions. No fixed stricture, fold thickening or mass.
No reflux with the water siphon maneuver. The 13 mm barium tablet
freely passes into the stomach.
IMPRESSION: Nonspecific esophageal motility disorder.

No fixed stricture.

## 2022-11-22 ENCOUNTER — Emergency Department (HOSPITAL_COMMUNITY)
Admission: EM | Admit: 2022-11-22 | Discharge: 2022-11-22 | Disposition: A | Discharge status: Home / Self Care | Payer: Self-pay | Attending: Emergency Medicine | Admitting: Emergency Medicine

## 2022-11-22 ENCOUNTER — Other Ambulatory Visit: Payer: Self-pay

## 2022-11-22 ENCOUNTER — Encounter (HOSPITAL_COMMUNITY): Payer: Self-pay | Admitting: *Deleted

## 2022-11-22 DIAGNOSIS — J069 Acute upper respiratory infection, unspecified: Secondary | ICD-10-CM | POA: Insufficient documentation

## 2022-11-22 DIAGNOSIS — Z79899 Other long term (current) drug therapy: Secondary | ICD-10-CM | POA: Insufficient documentation

## 2022-11-22 DIAGNOSIS — F17200 Nicotine dependence, unspecified, uncomplicated: Secondary | ICD-10-CM | POA: Insufficient documentation

## 2022-11-22 DIAGNOSIS — K529 Noninfective gastroenteritis and colitis, unspecified: Secondary | ICD-10-CM | POA: Insufficient documentation

## 2022-11-22 DIAGNOSIS — I10 Essential (primary) hypertension: Secondary | ICD-10-CM | POA: Insufficient documentation

## 2022-11-22 DIAGNOSIS — Z20822 Contact with and (suspected) exposure to covid-19: Secondary | ICD-10-CM | POA: Insufficient documentation

## 2022-11-22 LAB — URINALYSIS, ROUTINE W REFLEX MICROSCOPIC
Bilirubin Urine: NEGATIVE
Glucose, UA: NEGATIVE mg/dL
Ketones, ur: 5 mg/dL — AB
Leukocytes,Ua: NEGATIVE
Nitrite: NEGATIVE
Protein, ur: 30 mg/dL — AB
Specific Gravity, Urine: 1.02 (ref 1.005–1.030)
pH: 5 (ref 5.0–8.0)

## 2022-11-22 LAB — COMPREHENSIVE METABOLIC PANEL
ALT: 20 U/L (ref 0–44)
AST: 25 U/L (ref 15–41)
Albumin: 4.1 g/dL (ref 3.5–5.0)
Alkaline Phosphatase: 42 U/L (ref 38–126)
Anion gap: 9 (ref 5–15)
BUN: 14 mg/dL (ref 6–20)
CO2: 23 mmol/L (ref 22–32)
Calcium: 9.1 mg/dL (ref 8.9–10.3)
Chloride: 108 mmol/L (ref 98–111)
Creatinine, Ser: 0.88 mg/dL (ref 0.61–1.24)
GFR, Estimated: >60 mL/min (ref >60)
Glucose, Bld: 96 mg/dL (ref 70–99)
Potassium: 4.3 mmol/L (ref 3.5–5.1)
Sodium: 140 mmol/L (ref 135–145)
Total Bilirubin: 0.8 mg/dL (ref 0.3–1.2)
Total Protein: 7.6 g/dL (ref 6.5–8.1)

## 2022-11-22 LAB — CBC WITH DIFFERENTIAL/PLATELET
Abs Immature Granulocytes: 0.02 10*3/uL (ref 0.00–0.07)
Basophils Absolute: 0 10*3/uL (ref 0.0–0.1)
Basophils Relative: 0 %
Eosinophils Absolute: 0.1 10*3/uL (ref 0.0–0.5)
Eosinophils Relative: 1 %
HCT: 42.2 % (ref 39.0–52.0)
Hemoglobin: 14.3 g/dL (ref 13.0–17.0)
Immature Granulocytes: 0 %
Lymphocytes Relative: 17 %
Lymphs Abs: 1.3 10*3/uL (ref 0.7–4.0)
MCH: 33 pg (ref 26.0–34.0)
MCHC: 33.9 g/dL (ref 30.0–36.0)
MCV: 97.5 fL (ref 80.0–100.0)
Monocytes Absolute: 0.5 10*3/uL (ref 0.1–1.0)
Monocytes Relative: 6 %
Neutro Abs: 5.9 10*3/uL (ref 1.7–7.7)
Neutrophils Relative %: 76 %
Platelets: 202 10*3/uL (ref 150–400)
RBC: 4.33 MIL/uL (ref 4.22–5.81)
RDW: 13.7 % (ref 11.5–15.5)
WBC: 7.8 10*3/uL (ref 4.0–10.5)
nRBC: 0 % (ref 0.0–0.2)

## 2022-11-22 LAB — RESP PANEL BY RT-PCR (RSV, FLU A&B, COVID)  RVPGX2
Influenza A by PCR: NEGATIVE
Influenza B by PCR: NEGATIVE
Resp Syncytial Virus by PCR: NEGATIVE
SARS Coronavirus 2 by RT PCR: NEGATIVE

## 2022-11-22 LAB — LIPASE, BLOOD: Lipase: 34 U/L (ref 11–51)

## 2022-11-22 MED ORDER — BENZONATATE 100 MG PO CAPS: 100.0000 mg | ORAL_CAPSULE | Freq: Three times a day (TID) | ORAL | 0 refills | Status: DC

## 2022-11-22 MED ORDER — SODIUM CHLORIDE 0.9 % IV SOLN: INTRAVENOUS | Status: DC

## 2022-11-22 MED ORDER — ONDANSETRON HCL 4 MG/2ML IJ SOLN
4.0000 mg | Freq: Once | INTRAMUSCULAR | Status: AC
  Administered 2022-11-22: 4 mg via INTRAVENOUS
  Filled 2022-11-22: qty 2

## 2022-11-22 MED ORDER — SODIUM CHLORIDE 0.9 % IV BOLUS
1000.0000 mL | Freq: Once | INTRAVENOUS | Status: AC
  Administered 2022-11-22: 1000 mL via INTRAVENOUS

## 2022-11-22 MED ORDER — ONDANSETRON 8 MG PO TBDP: 8.0000 mg | ORAL_TABLET | Freq: Three times a day (TID) | ORAL | 0 refills | Status: DC | PRN

## 2022-11-22 NOTE — ED Triage Notes (Signed)
Pt c/o vomiting that started at 3am this morning with cold chills  Pt states he feels weak and syncopal

## 2022-11-22 NOTE — ED Provider Notes (Signed)
[facility] EMERGENCY DEPARTMENT Provider Note   CSN: [id number] Arrival date & time: 11/22/22  9021     History  Chief Complaint  Patient presents with   Emesis    Ethan [name] is a 51 y.o. male.   Emesis  Patient has a history of hypertension hypokalemia diverticulitis cellulitis and gout.  He does smoke.  He does drink alcohol.  Patient presents to the ER for evaluation of nausea vomiting diarrhea.  Patient states he woke up few hours ago and started having nausea vomiting.  He has also had a couple episodes of diarrhea.  Patient states he felt chilled and then hot.  He has had a slight cough recently.  He is not having any pain but his stomach feels upset.  He is concerned that he might of gotten food poisoning although no one else is ill at this time.  He felt like he was going to pass out so he came to the ED.  Home Medications Prior to Admission medications   Medication Sig Start Date End Date Taking? Authorizing Provider  albuterol (PROVENTIL HFA;VENTOLIN HFA) 108 (90 BASE) MCG/ACT inhaler Inhale 2 puffs into the lungs every 6 (six) hours as needed for wheezing or shortness of breath.    [provider]  albuterol (PROVENTIL) (2.5 MG/3ML) 0.083% nebulizer solution Take 3 mLs (2.5 mg total) by nebulization every 6 (six) hours as needed for wheezing or shortness of breath. 09/23/14   Ward, Layla Maw, DO  ALPRAZolam Prudy Feeler) 1 MG tablet Take 1 mg by mouth 4 (four) times daily as needed for anxiety.     [provider]  amLODipine (NORVASC) 10 MG tablet Take 10 mg by mouth daily.    [provider]  cephALEXin (KEFLEX) 500 MG capsule Take 1 capsule (500 mg total) by mouth 4 (four) times daily. Patient not taking: Reported on 06/02/2015 05/15/15   Eber Hong, MD  clindamycin (CLEOCIN) 300 MG capsule Take 1 capsule (300 mg total) by mouth every 6 (six) hours. 06/02/15   Burgess Amor, PA-C  dexlansoprazole (DEXILANT) 60 MG capsule Take 1 capsule (60 mg  total) by mouth daily. 01/16/14   Tiffany Kocher, PA-C  HYDROcodone-acetaminophen (NORCO/VICODIN) 5-325 MG per tablet Take 1 tablet by mouth every 4 (four) hours as needed. 06/02/15   Burgess Amor, PA-C  lisinopril-hydrochlorothiazide (PRINZIDE,ZESTORETIC) 20-25 MG per tablet Take 1 tablet by mouth daily.    [provider]  Multiple Vitamin (MULTIVITAMIN WITH MINERALS) TABS tablet Take 1 tablet by mouth daily.    [provider]  oxyCODONE-acetaminophen (PERCOCET/ROXICET) 5-325 MG per tablet Take 1 tablet by mouth every 6 (six) hours as needed for severe pain.    [provider]  Wheat Dextrin (BENEFIBER PO) Take 1 tablet by mouth daily.     [provider]      Allergies    Penicillins    Review of Systems   Review of Systems  Gastrointestinal:  Positive for vomiting.    Physical Exam Updated Vital Signs BP (!) 161/97 (BP Location: Left Arm)   Pulse 81   Temp 98.2 F (36.8 C) (Oral)   Resp 20   Ht 1.905 m (6\' 3" )   Wt 127 kg   SpO2 99%   BMI 35.00 kg/m  Physical Exam Vitals and nursing note reviewed.  Constitutional:      General: He is not in acute distress.    Appearance: He is well-developed.  HENT:     Head: Normocephalic  and atraumatic.     Right Ear: External ear normal.     Left Ear: External ear normal.  Eyes:     General: No scleral icterus.       Right eye: No discharge.        Left eye: No discharge.     Conjunctiva/sclera: Conjunctivae normal.  Neck:     Trachea: No tracheal deviation.  Cardiovascular:     Rate and Rhythm: Normal rate and regular rhythm.  Pulmonary:     Effort: Pulmonary effort is normal. No respiratory distress.     Breath sounds: Normal breath sounds. No stridor. No wheezing or rales.  Abdominal:     General: Bowel sounds are normal. There is no distension.     Palpations: Abdomen is soft.     Tenderness: There is no abdominal tenderness. There is no guarding or rebound.  Musculoskeletal:         General: No tenderness or deformity.     Cervical back: Neck supple.  Skin:    General: Skin is warm and dry.     Findings: No rash.  Neurological:     General: No focal deficit present.     Mental Status: He is alert.     Cranial Nerves: No cranial nerve deficit, dysarthria or facial asymmetry.     Sensory: No sensory deficit.     Motor: No abnormal muscle tone or seizure activity.     Coordination: Coordination normal.  Psychiatric:        Mood and Affect: Mood normal.     ED Results / Procedures / Treatments   Labs (all labs ordered are listed, but only abnormal results are displayed) Labs Reviewed  URINALYSIS, ROUTINE W REFLEX MICROSCOPIC - Abnormal; Notable for the following components:      Result Value   Hgb urine dipstick MODERATE (*)    Ketones, ur 5 (*)    Protein, ur 30 (*)    Bacteria, UA RARE (*)    All other components within normal limits  RESP PANEL BY RT-PCR (RSV, FLU A&B, COVID)  [id number]  COMPREHENSIVE METABOLIC PANEL  LIPASE, BLOOD  CBC WITH DIFFERENTIAL/PLATELET    EKG None  Radiology No results found.  Procedures Procedures    Medications Ordered in ED Medications  sodium chloride 0.9 % bolus 1,000 mL (1,000 mLs Intravenous New Bag/Given 11/22/22 0831)    And  0.9 %  sodium chloride infusion ( Intravenous New Bag/Given 11/22/22 0909)  ondansetron (ZOFRAN) injection 4 mg (has no administration in time range)  ondansetron (ZOFRAN) injection 4 mg (4 mg Intravenous Given 11/22/22 0832)    ED Course/ Medical Decision Making/ A&P Clinical Course as of 11/22/22 0954  Mon Nov 22, 2022  0930 Resp panel by RT-PCR (RSV, Flu A&B, Covid) Anterior Nasal Swab Covid flu negative [JK]  0930 CBC with Diff Normal [JK]  0930 Lipase, blood Normal [JK]  0930 Comprehensive metabolic panel Normal [JK]  0946 Urinalysis does show 10 whites and 6-10 red blood cells [JK]    Clinical Course User Index [JK] Linwood Dibbles, MD                           Medical  Decision Making Problems Addressed: Gastroenteritis: acute illness or injury Upper respiratory tract infection, unspecified type: acute illness or injury  Amount and/or Complexity of Data Reviewed Labs: ordered. Decision-making details documented in ED Course.  Risk Prescription drug management.   Patient presented  to the ED for evaluation of cough congestion vomiting.  ED workup reassuring.  Lungs clear doubt pneumonia.  Laboratory tests do not show any signs of severe leukocytosis, hepatitis pancreatitis or severe dehydration.  Abdominal exam benign.  No focal tenderness to suggest appendicitis cholecystitis diverticulitis.  Patient was treated with IV fluids and antiemetics.  No vomiting in the ED.  Suspect viral etiology.  Will discharge home with supportive care.  Evaluation and diagnostic testing in the emergency department does not suggest an emergent condition requiring admission or immediate intervention beyond what has been performed at this time.  The patient is safe for discharge and has been instructed to return immediately for worsening symptoms, change in symptoms or any other concerns.        Final Clinical Impression(s) / ED Diagnoses Final diagnoses:  None    Rx / DC Orders ED Discharge Orders     None         Linwood Dibbles, MD 11/22/22 [phone]

## 2023-04-18 ENCOUNTER — Ambulatory Visit
Admission: EM | Admit: 2023-04-18 | Discharge: 2023-04-18 | Disposition: A | Discharge status: Home / Self Care | Payer: No Typology Code available for payment source | Attending: Nurse Practitioner | Admitting: Nurse Practitioner

## 2023-04-18 DIAGNOSIS — Z1152 Encounter for screening for COVID-19: Secondary | ICD-10-CM

## 2023-04-18 DIAGNOSIS — J029 Acute pharyngitis, unspecified: Secondary | ICD-10-CM

## 2023-04-18 LAB — POCT RAPID STREP A (OFFICE): Rapid Strep A Screen: NEGATIVE

## 2023-04-18 MED ORDER — LIDOCAINE VISCOUS HCL 2 % MT SOLN: 5.0000 mL | Freq: Four times a day (QID) | OROMUCOSAL | 0 refills | Status: DC | PRN

## 2023-04-18 NOTE — ED Provider Notes (Signed)
[facility]    CSN: [id number] Arrival date & time: 04/18/23  1403      History   Chief Complaint No chief complaint on file.   HPI Ethan [name] is a 52 y.o. male.   The history is provided by the patient.   Patient presents for complaints of sore throat that started this morning upon awakening.  Patient denies fever, chills, headache, ear pain, ear drainage, wheezing, shortness of breath, difficulty breathing, abdominal pain, nausea, vomiting, or diarrhea.  Patient reports he is not taking any medication for his symptoms.  States that he was around his cousin who has been sick with a cold.  Past Medical History:  Diagnosis Date   Asthma    Cellulitis    hospitalized 07/2013   Diverticulitis    06/2012   Gout    Hypertension    Hypokalemia    Pneumonia     Patient Active Problem List   Diagnosis Date Noted   Elevated glucose 06/25/2014   GERD (gastroesophageal reflux disease) 11/09/2013   Intractable hiccups 11/09/2013   Cellulitis and abscess of leg, except foot 08/11/2013   Acute gouty arthritis 08/10/2013   Hypokalemia 08/09/2013   Tobacco use disorder 08/09/2013   Hypertension 07/14/2011    Past Surgical History:  Procedure Laterality Date   ARM WOUND REPAIR / CLOSURE     ESOPHAGOGASTRODUODENOSCOPY N/A 11/14/2013   [facility]:QIONGE hernia; otherwise negative examination   WRIST SURGERY  12/2013   right       Home Medications    Prior to Admission medications   Medication Sig Start Date End Date Taking? Authorizing Provider  allopurinol (ZYLOPRIM) 300 MG tablet Take 300 mg by mouth. 06/07/16  Yes [provider]  lidocaine (XYLOCAINE) 2 % solution Use as directed 5 mLs in the mouth or throat every 6 (six) hours as needed for mouth pain. Gargle and spit 5 mL every 6 hours as needed for throat pain or discomfort. 04/18/23  Yes Samiksha Pellicano-Warren, Sadie Haber, NP  albuterol (PROVENTIL HFA;VENTOLIN HFA) 108 (90 BASE) MCG/ACT inhaler Inhale 2 puffs  into the lungs every 6 (six) hours as needed for wheezing or shortness of breath.    [provider]  albuterol (PROVENTIL) (2.5 MG/3ML) 0.083% nebulizer solution Take 3 mLs (2.5 mg total) by nebulization every 6 (six) hours as needed for wheezing or shortness of breath. 09/23/14   Ward, Layla Maw, DO  ALPRAZolam Prudy Feeler) 1 MG tablet Take 1 mg by mouth 4 (four) times daily as needed for anxiety.     [provider]  amLODipine (NORVASC) 10 MG tablet Take 10 mg by mouth daily.    [provider]  benzonatate (TESSALON) 100 MG capsule Take 1 capsule (100 mg total) by mouth every 8 (eight) hours. 11/22/22   Linwood Dibbles, MD  cephALEXin (KEFLEX) 500 MG capsule Take 1 capsule (500 mg total) by mouth 4 (four) times daily. Patient not taking: Reported on 06/02/2015 05/15/15   Eber Hong, MD  clindamycin (CLEOCIN) 300 MG capsule Take 1 capsule (300 mg total) by mouth every 6 (six) hours. 06/02/15   Burgess Amor, PA-C  dexlansoprazole (DEXILANT) 60 MG capsule Take 1 capsule (60 mg total) by mouth daily. 01/16/14   Tiffany Kocher, PA-C  HYDROcodone-acetaminophen (NORCO/VICODIN) 5-325 MG per tablet Take 1 tablet by mouth every 4 (four) hours as needed. 06/02/15   Burgess Amor, PA-C  lisinopril-hydrochlorothiazide (PRINZIDE,ZESTORETIC) 20-25 MG per tablet Take 1 tablet by mouth daily.    [provider]  Multiple Vitamin (MULTIVITAMIN WITH MINERALS) TABS tablet Take 1 tablet by mouth daily.    [provider]  ondansetron (ZOFRAN-ODT) 8 MG disintegrating tablet Take 1 tablet (8 mg total) by mouth every 8 (eight) hours as needed for nausea or vomiting. 11/22/22   Linwood Dibbles, MD  oxyCODONE-acetaminophen (PERCOCET/ROXICET) 5-325 MG per tablet Take 1 tablet by mouth every 6 (six) hours as needed for severe pain.    [provider]  Wheat Dextrin (BENEFIBER PO) Take 1 tablet by mouth daily.     [provider]    Family History Family History  Problem Relation  Age of Onset   Colon cancer Neg Hx     Social History Social History   Tobacco Use   Smoking status: Every Day    Packs/day: 0.50    Years: 22.00    Additional pack years: 0.00    Total pack years: 11.00    Types: Cigarettes   Smokeless tobacco: Never   Tobacco comments:    1/2 pack daily  Vaping Use   Vaping Use: Some days   Substances: Nicotine, Flavoring  Substance Use Topics   Alcohol use: Yes    Comment: heavy use prior to (04/2013), on weekends   Drug use: No     Allergies   Penicillins   Review of Systems Review of Systems Per HPI  Physical Exam Triage Vital Signs ED Triage Vitals  Enc Vitals Group     BP 04/18/23 1434 138/86     Pulse Rate 04/18/23 1434 82     Resp 04/18/23 1434 20     Temp 04/18/23 1434 98 F (36.7 C)     Temp Source 04/18/23 1434 Oral     SpO2 04/18/23 1434 98 %     Weight --      Height --      Head Circumference --      Peak Flow --      Pain Score 04/18/23 1437 8     Pain Loc --      Pain Edu? --      Excl. in GC? --    No data found.  Updated Vital Signs BP 138/86 (BP Location: Right Arm)   Pulse 82   Temp 98 F (36.7 C) (Oral)   Resp 20   SpO2 98%   Visual Acuity Right Eye Distance:   Left Eye Distance:   Bilateral Distance:    Right Eye Near:   Left Eye Near:    Bilateral Near:     Physical Exam Vitals and nursing note reviewed.  Constitutional:      General: He is not in acute distress.    Appearance: Normal appearance.  HENT:     Head: Normocephalic.     Right Ear: Tympanic membrane, ear canal and external ear normal.     Left Ear: Tympanic membrane, ear canal and external ear normal.     Nose: Congestion present.     Mouth/Throat:     Lips: Pink.     Mouth: Mucous membranes are moist.     Pharynx: Uvula midline. Pharyngeal swelling and posterior oropharyngeal erythema present. No oropharyngeal exudate or uvula swelling.     Tonsils: 1+ on the right. 1+ on the left.  Eyes:     Extraocular  Movements: Extraocular movements intact.     Conjunctiva/sclera: Conjunctivae normal.     Pupils: Pupils are equal, round, and reactive to light.  Cardiovascular:  Rate and Rhythm: Normal rate and regular rhythm.     Pulses: Normal pulses.     Heart sounds: Normal heart sounds.  Pulmonary:     Effort: Pulmonary effort is normal. No respiratory distress.     Breath sounds: Normal breath sounds. No stridor. No wheezing, rhonchi or rales.  Abdominal:     General: Bowel sounds are normal.     Palpations: Abdomen is soft.     Tenderness: There is no abdominal tenderness.  Musculoskeletal:     Cervical back: Normal range of motion.  Lymphadenopathy:     Cervical: No cervical adenopathy.  Skin:    General: Skin is warm and dry.  Neurological:     General: No focal deficit present.     Mental Status: He is alert and oriented to person, place, and time.  Psychiatric:        Mood and Affect: Mood normal.        Behavior: Behavior normal.      UC Treatments / Results  Labs (all labs ordered are listed, but only abnormal results are displayed) Labs Reviewed  CULTURE, GROUP A STREP ([facility])  SARS CORONAVIRUS 2 (TAT 6-24 HRS)  POCT RAPID STREP A (OFFICE)    EKG   Radiology No results found.  Procedures Procedures (including critical care time)  Medications Ordered in UC Medications - No data to display  Initial Impression / Assessment and Plan / UC Course  I have reviewed the triage vital signs and the nursing notes.  Pertinent labs & imaging results that were available during my care of the patient were reviewed by me and considered in my medical decision making (see chart for details).  The patient is well-appearing, he is in no acute distress, vital signs are stable.  Rapid strep test is negative, throat culture and COVID test are pending.  Suspect a viral upper respiratory infection with cough at this time.  For patient's throat pain, patient was prescribed viscous  lidocaine 2% to gargle and spit.  Supportive care recommendations were provided and discussed with the patient to include increase his fluids, allowing for plenty of rest, and use of over-the-counter cough medicine such as Mucinex or Robitussin as needed.  Patient is a candidate to receive Paxlovid if his COVID test is positive.  Patient was given indications of when follow-up may be necessary.  Patient is in agreement with this plan of care and verbalizes understanding.  All questions were answered.  Patient stable for discharge.   Final Clinical Impressions(s) / UC Diagnoses   Final diagnoses:  Sore throat  Encounter for screening for COVID-19     Discharge Instructions      Rapid strep test is negative, COVID test and throat culture are pending.  You will be contacted if the pending test results are positive. Take medication as prescribed. Increase fluids and allow for plenty of rest. Recommend Tylenol or ibuprofen as needed for pain, fever, or general discomfort. Recommend throat lozenges, Chloraseptic or honey to help with throat pain. Warm salt water gargles 3-4 times daily to help with throat pain or discomfort. Recommend a diet with soft foods to include soups, broths, puddings, yogurt, Jell-O's, or popsicles until symptoms improve. Follow-up if symptoms do not improve within the next 7 to 10 days or sooner if symptoms worsen..      ED Prescriptions     Medication Sig Dispense Auth. Provider   lidocaine (XYLOCAINE) 2 % solution Use as directed 5 mLs in the mouth  or throat every 6 (six) hours as needed for mouth pain. Gargle and spit 5 mL every 6 hours as needed for throat pain or discomfort. 100 mL Tennie Grussing-Warren, Sadie Haber, NP      PDMP not reviewed this encounter.   Abran Cantor, NP 04/18/23 1517

## 2023-04-18 NOTE — Discharge Instructions (Addendum)
Rapid strep test is negative, COVID test and throat culture are pending.  You will be contacted if the pending test results are positive. Take medication as prescribed. Increase fluids and allow for plenty of rest. Recommend Tylenol or ibuprofen as needed for pain, fever, or general discomfort. Recommend throat lozenges, Chloraseptic or honey to help with throat pain. Warm salt water gargles 3-4 times daily to help with throat pain or discomfort. Recommend a diet with soft foods to include soups, broths, puddings, yogurt, Jell-O's, or popsicles until symptoms improve. Follow-up if symptoms do not improve within the next 7 to 10 days or sooner if symptoms worsen.Marland Kitchen

## 2023-04-18 NOTE — ED Triage Notes (Signed)
Pt c/o sore throat pt states he woke up with a sore throat difficulty swallowing and hurts to talk. Cough and chest and nasal congestion.

## 2023-04-19 LAB — SARS CORONAVIRUS 2 (TAT 6-24 HRS): SARS Coronavirus 2: NEGATIVE

## 2023-04-21 LAB — CULTURE, GROUP A STREP (THRC)

## 2023-07-07 ENCOUNTER — Other Ambulatory Visit: Payer: Self-pay

## 2023-07-07 ENCOUNTER — Emergency Department (HOSPITAL_COMMUNITY)
Admission: EM | Admit: 2023-07-07 | Discharge: 2023-07-07 | Disposition: A | Discharge status: Home / Self Care | Payer: No Typology Code available for payment source

## 2023-07-07 ENCOUNTER — Emergency Department (HOSPITAL_COMMUNITY): Payer: No Typology Code available for payment source

## 2023-07-07 ENCOUNTER — Encounter (HOSPITAL_COMMUNITY): Payer: Self-pay

## 2023-07-07 DIAGNOSIS — R0789 Other chest pain: Secondary | ICD-10-CM | POA: Insufficient documentation

## 2023-07-07 DIAGNOSIS — R002 Palpitations: Secondary | ICD-10-CM | POA: Insufficient documentation

## 2023-07-07 DIAGNOSIS — R202 Paresthesia of skin: Secondary | ICD-10-CM | POA: Insufficient documentation

## 2023-07-07 LAB — CBC WITH DIFFERENTIAL/PLATELET
Abs Immature Granulocytes: 0.08 10*3/uL — ABNORMAL HIGH (ref 0.00–0.07)
Basophils Absolute: 0 10*3/uL (ref 0.0–0.1)
Basophils Relative: 1 %
Eosinophils Absolute: 0.2 10*3/uL (ref 0.0–0.5)
Eosinophils Relative: 2 %
HCT: 37.2 % — ABNORMAL LOW (ref 39.0–52.0)
Hemoglobin: 12.4 g/dL — ABNORMAL LOW (ref 13.0–17.0)
Immature Granulocytes: 1 %
Lymphocytes Relative: 22 %
Lymphs Abs: 1.9 10*3/uL (ref 0.7–4.0)
MCH: 33.9 pg (ref 26.0–34.0)
MCHC: 33.3 g/dL (ref 30.0–36.0)
MCV: 101.6 fL — ABNORMAL HIGH (ref 80.0–100.0)
Monocytes Absolute: 0.6 10*3/uL (ref 0.1–1.0)
Monocytes Relative: 7 %
Neutro Abs: 6 10*3/uL (ref 1.7–7.7)
Neutrophils Relative %: 67 %
Platelets: 255 10*3/uL (ref 150–400)
RBC: 3.66 MIL/uL — ABNORMAL LOW (ref 4.22–5.81)
RDW: 12.9 % (ref 11.5–15.5)
WBC: 8.7 10*3/uL (ref 4.0–10.5)
nRBC: 0 % (ref 0.0–0.2)

## 2023-07-07 LAB — COMPREHENSIVE METABOLIC PANEL
ALT: 36 U/L (ref 0–44)
AST: 28 U/L (ref 15–41)
Albumin: 3.9 g/dL (ref 3.5–5.0)
Alkaline Phosphatase: 54 U/L (ref 38–126)
Anion gap: 9 (ref 5–15)
BUN: 21 mg/dL — ABNORMAL HIGH (ref 6–20)
CO2: 23 mmol/L (ref 22–32)
Calcium: 9 mg/dL (ref 8.9–10.3)
Chloride: 99 mmol/L (ref 98–111)
Creatinine, Ser: 1.29 mg/dL — ABNORMAL HIGH (ref 0.61–1.24)
GFR, Estimated: >60 mL/min (ref >60)
Glucose, Bld: 102 mg/dL — ABNORMAL HIGH (ref 70–99)
Potassium: 4.8 mmol/L (ref 3.5–5.1)
Sodium: 131 mmol/L — ABNORMAL LOW (ref 135–145)
Total Bilirubin: 0.5 mg/dL (ref 0.3–1.2)
Total Protein: 7.3 g/dL (ref 6.5–8.1)

## 2023-07-07 LAB — LIPASE, BLOOD: Lipase: 61 U/L — ABNORMAL HIGH (ref 11–51)

## 2023-07-07 LAB — TROPONIN I (HIGH SENSITIVITY): Troponin I (High Sensitivity): 4 ng/L (ref <18)

## 2023-07-07 LAB — CBG MONITORING, ED: Glucose-Capillary: 98 mg/dL (ref 70–99)

## 2023-07-07 NOTE — ED Provider Notes (Signed)
[place] EMERGENCY DEPARTMENT AT [facility] Provider Note   CSN: [id number] Arrival date & time: 07/07/23  1442     History  Chief Complaint  Patient presents with   Numbness    Toes/fingers/tongue   Pleurisy    cramping    Ethan [name] is a 52 y.o. male.  52 year old male presenting emergency department with chief complaint of pins-and-needles feeling to his bilateral feet just above his toes and to his distal fingertips as well as some perioral tingling as well.  Symptoms started 2 days ago.  He noted that he did have some chest discomfort/palpitations yesterday or the day before, he is not quite sure.  No shortness of breath.  Denies any headache, vision changes, he aphasia, slurred words, unilateral weakness, uncoordination.  Reports that he has been cutting [place] plants for the past 2 days as well for work.         Home Medications Prior to Admission medications   Medication Sig Start Date End Date Taking? Authorizing Provider  albuterol (PROVENTIL HFA;VENTOLIN HFA) 108 (90 BASE) MCG/ACT inhaler Inhale 2 puffs into the lungs every 6 (six) hours as needed for wheezing or shortness of breath.    [provider]  albuterol (PROVENTIL) (2.5 MG/3ML) 0.083% nebulizer solution Take 3 mLs (2.5 mg total) by nebulization every 6 (six) hours as needed for wheezing or shortness of breath. 09/23/14   Ward, Layla Maw, DO  allopurinol (ZYLOPRIM) 300 MG tablet Take 300 mg by mouth. 06/07/16   [provider]  ALPRAZolam Prudy Feeler) 1 MG tablet Take 1 mg by mouth 4 (four) times daily as needed for anxiety.     [provider]  amLODipine (NORVASC) 10 MG tablet Take 10 mg by mouth daily.    [provider]  benzonatate (TESSALON) 100 MG capsule Take 1 capsule (100 mg total) by mouth every 8 (eight) hours. 11/22/22   Linwood Dibbles, MD  cephALEXin (KEFLEX) 500 MG capsule Take 1 capsule (500 mg total) by mouth 4 (four) times daily. Patient not taking:  Reported on 06/02/2015 05/15/15   Eber Hong, MD  clindamycin (CLEOCIN) 300 MG capsule Take 1 capsule (300 mg total) by mouth every 6 (six) hours. 06/02/15   Burgess Amor, PA-C  dexlansoprazole (DEXILANT) 60 MG capsule Take 1 capsule (60 mg total) by mouth daily. 01/16/14   Tiffany Kocher, PA-C  HYDROcodone-acetaminophen (NORCO/VICODIN) 5-325 MG per tablet Take 1 tablet by mouth every 4 (four) hours as needed. 06/02/15   Burgess Amor, PA-C  lidocaine (XYLOCAINE) 2 % solution Use as directed 5 mLs in the mouth or throat every 6 (six) hours as needed for mouth pain. Gargle and spit 5 mL every 6 hours as needed for throat pain or discomfort. 04/18/23   Leath-Warren, Sadie Haber, NP  lisinopril-hydrochlorothiazide (PRINZIDE,ZESTORETIC) 20-25 MG per tablet Take 1 tablet by mouth daily.    [provider]  Multiple Vitamin (MULTIVITAMIN WITH MINERALS) TABS tablet Take 1 tablet by mouth daily.    [provider]  ondansetron (ZOFRAN-ODT) 8 MG disintegrating tablet Take 1 tablet (8 mg total) by mouth every 8 (eight) hours as needed for nausea or vomiting. 11/22/22   Linwood Dibbles, MD  oxyCODONE-acetaminophen (PERCOCET/ROXICET) 5-325 MG per tablet Take 1 tablet by mouth every 6 (six) hours as needed for severe pain.    [provider]  Wheat Dextrin (BENEFIBER PO) Take 1 tablet by mouth daily.     [provider]  Allergies    Penicillins    Review of Systems   Review of Systems  Physical Exam Updated Vital Signs BP 125/87   Pulse 84   Temp 98.4 F (36.9 C) (Oral)   Resp 20   Ht 6\' 3"  (1.905 m)   Wt 136.1 kg   SpO2 98%   BMI 37.50 kg/m  Physical Exam Vitals and nursing note reviewed.  Constitutional:      General: He is not in acute distress.    Appearance: He is obese. He is not ill-appearing or toxic-appearing.  HENT:     Head: Normocephalic and atraumatic.     Nose: Nose normal.     Mouth/Throat:     Mouth: Mucous membranes are moist.  Eyes:      Conjunctiva/sclera: Conjunctivae normal.     Pupils: Pupils are equal, round, and reactive to light.  Cardiovascular:     Rate and Rhythm: Normal rate and regular rhythm.  Pulmonary:     Effort: Pulmonary effort is normal.     Breath sounds: Normal breath sounds.  Abdominal:     General: Abdomen is flat. There is no distension.     Tenderness: There is no abdominal tenderness. There is no guarding or rebound.  Musculoskeletal:        General: Normal range of motion.     Cervical back: Normal range of motion.     Right lower leg: No edema.     Left lower leg: No edema.  Skin:    General: Skin is warm and dry.     Capillary Refill: Capillary refill takes less than 2 seconds.  Neurological:     Mental Status: He is alert and oriented to person, place, and time.     Cranial Nerves: No cranial nerve deficit.     Sensory: Sensory deficit (subjective parasthesias to bilateral toes and to bilateral fingertips) present.     Motor: No weakness.     Coordination: Coordination normal.     Gait: Gait normal.     ED Results / Procedures / Treatments   Labs (all labs ordered are listed, but only abnormal results are displayed) Labs Reviewed  CBC WITH DIFFERENTIAL/PLATELET - Abnormal; Notable for the following components:      Result Value   RBC 3.66 (*)    Hemoglobin 12.4 (*)    HCT 37.2 (*)    MCV 101.6 (*)    Abs Immature Granulocytes 0.08 (*)    All other components within normal limits  COMPREHENSIVE METABOLIC PANEL - Abnormal; Notable for the following components:   Sodium 131 (*)    Glucose, Bld 102 (*)    BUN 21 (*)    Creatinine, Ser 1.29 (*)    All other components within normal limits  LIPASE, BLOOD - Abnormal; Notable for the following components:   Lipase 61 (*)    All other components within normal limits  CBG MONITORING, ED  TROPONIN I (HIGH SENSITIVITY)  TROPONIN I (HIGH SENSITIVITY)    EKG None  Radiology CT Head Wo Contrast  Result Date:  07/07/2023 CLINICAL DATA:  Headache, neuro deficit EXAM: CT HEAD WITHOUT CONTRAST TECHNIQUE: Contiguous axial images were obtained from the base of the skull through the vertex without intravenous contrast. RADIATION DOSE REDUCTION: This exam was performed according to the departmental dose-optimization program which includes automated exposure control, adjustment of the mA and/or kV according to patient size and/or use of iterative reconstruction technique. COMPARISON:  None Available. FINDINGS: Brain: No evidence  of acute infarction, hemorrhage, hydrocephalus, extra-axial collection or mass lesion/mass effect. Vascular: No hyperdense vessel or unexpected calcification. Skull: Normal. Negative for fracture or focal lesion. Sinuses/Orbits: No middle ear or mastoid effusion. Paranasal sinuses are notable for mucosal thickening in the bilateral maxillary sinuses. Orbits are unremarkable. Other: None. IMPRESSION: No acute intracranial abnormality. Electronically Signed   By: Lorenza Cambridge M.D.   On: 07/07/2023 16:42    Procedures Procedures    Medications Ordered in ED Medications - No data to display  ED Course/ Medical Decision Making/ A&P Clinical Course as of 07/07/23 1717  Thu Jul 07, 2023  1615 Comprehensive metabolic panel(!) Under elevation his creatinine.  Mild hyponatremia. [TY]  1616 Lipase(!): 61 Slightly elevated, but not pancreatitis.  Soft benign abdomen. [TY]  1616 Troponin I (High Sensitivity): 4 ACS unlikely [TY]  1646 CT Head Wo Contrast IMPRESSION: No acute intracranial abnormality   [TY]    Clinical Course User Index [TY] Coral Spikes, DO                                 Medical Decision Making This is a 52 year old male presenting emergency department with paresthesias and a peripheral neuropathy distribution afebrile nontachycardic hemodynamically stable.  Physical exam reassuring with no localizing deficits.  He is able to feel fingertips and toes, but states they  are somewhat tingly.  Symptoms following more peripheral neuropathy type pattern regular central lesion.  He is not having any weakness with his symptoms.  Labs with mild hyponatremia, mild elevation in creatinine, but no AKI.  She has got no fever, tachycardia or leukocytosis to suggest systemic infection.  CT scan negative for acute intracranial pathology.  Troponin negative.  EKG sinus rhythm.  ACS unlikely.  Arrhythmia not identified.  Shared decision-making with patient regarding further testing with MRI versus outpatient follow-up.  He would like to follow-up outpatient.  Will discharge in stable condition with neurology follow-up.  Amount and/or Complexity of Data Reviewed External Data Reviewed: notes.    Details: Per chart review no recent admissions, if you minor URI presents to the ED back in May. Labs: ordered. Decision-making details documented in ED Course. Radiology: ordered. Decision-making details documented in ED Course.          Final Clinical Impression(s) / ED Diagnoses Final diagnoses:  Paresthesia    Rx / DC Orders ED Discharge Orders          Ordered    Ambulatory referral to Neurology       Comments: An appointment is requested in approximately: 1 week   07/07/23 1717              Coral Spikes, DO 07/07/23 1717

## 2023-07-07 NOTE — ED Triage Notes (Signed)
Pt reports:  Toe/Finger Numbness Started Monday Tongue Numbness Started yesterday Chest Cramp Started today

## 2023-07-07 NOTE — Discharge Instructions (Signed)
Please follow-up with her primary doctor.  Also please follow-up with neurology.  They should call you to schedule appointment.  Regulate developing fevers, chills, headache, vision changes, unilateral weakness, numbness or tingling progresses up your arm you had chest pain, shortness of breath or any new or worsening symptoms that are concerning to you.

## 2023-07-07 NOTE — ED Notes (Signed)
Patient transported to CT 

## 2023-08-11 ENCOUNTER — Other Ambulatory Visit: Payer: Self-pay

## 2023-08-11 ENCOUNTER — Emergency Department (HOSPITAL_COMMUNITY)
Admission: EM | Admit: 2023-08-11 | Discharge: 2023-08-11 | Disposition: A | Discharge status: Home / Self Care | Payer: 59 | Attending: Emergency Medicine | Admitting: Emergency Medicine

## 2023-08-11 ENCOUNTER — Encounter (HOSPITAL_COMMUNITY): Payer: Self-pay

## 2023-08-11 ENCOUNTER — Emergency Department (HOSPITAL_COMMUNITY): Payer: 59

## 2023-08-11 DIAGNOSIS — K219 Gastro-esophageal reflux disease without esophagitis: Secondary | ICD-10-CM | POA: Diagnosis not present

## 2023-08-11 DIAGNOSIS — Z7951 Long term (current) use of inhaled steroids: Secondary | ICD-10-CM | POA: Insufficient documentation

## 2023-08-11 DIAGNOSIS — R062 Wheezing: Secondary | ICD-10-CM | POA: Diagnosis not present

## 2023-08-11 DIAGNOSIS — R051 Acute cough: Secondary | ICD-10-CM | POA: Diagnosis not present

## 2023-08-11 DIAGNOSIS — M791 Myalgia, unspecified site: Secondary | ICD-10-CM | POA: Diagnosis present

## 2023-08-11 DIAGNOSIS — Z79899 Other long term (current) drug therapy: Secondary | ICD-10-CM | POA: Insufficient documentation

## 2023-08-11 DIAGNOSIS — I10 Essential (primary) hypertension: Secondary | ICD-10-CM | POA: Diagnosis not present

## 2023-08-11 DIAGNOSIS — R509 Fever, unspecified: Secondary | ICD-10-CM | POA: Insufficient documentation

## 2023-08-11 DIAGNOSIS — R0981 Nasal congestion: Secondary | ICD-10-CM | POA: Insufficient documentation

## 2023-08-11 DIAGNOSIS — Z1152 Encounter for screening for COVID-19: Secondary | ICD-10-CM | POA: Insufficient documentation

## 2023-08-11 DIAGNOSIS — R52 Pain, unspecified: Secondary | ICD-10-CM

## 2023-08-11 DIAGNOSIS — E119 Type 2 diabetes mellitus without complications: Secondary | ICD-10-CM | POA: Diagnosis not present

## 2023-08-11 LAB — CBC WITH DIFFERENTIAL/PLATELET
Abs Immature Granulocytes: 0.04 10*3/uL (ref 0.00–0.07)
Basophils Absolute: 0 10*3/uL (ref 0.0–0.1)
Basophils Relative: 0 %
Eosinophils Absolute: 0.2 10*3/uL (ref 0.0–0.5)
Eosinophils Relative: 3 %
HCT: 36.2 % — ABNORMAL LOW (ref 39.0–52.0)
Hemoglobin: 12 g/dL — ABNORMAL LOW (ref 13.0–17.0)
Immature Granulocytes: 1 %
Lymphocytes Relative: 16 %
Lymphs Abs: 1 10*3/uL (ref 0.7–4.0)
MCH: 33.7 pg (ref 26.0–34.0)
MCHC: 33.1 g/dL (ref 30.0–36.0)
MCV: 101.7 fL — ABNORMAL HIGH (ref 80.0–100.0)
Monocytes Absolute: 0.4 10*3/uL (ref 0.1–1.0)
Monocytes Relative: 6 %
Neutro Abs: 4.4 10*3/uL (ref 1.7–7.7)
Neutrophils Relative %: 74 %
Platelets: 181 10*3/uL (ref 150–400)
RBC: 3.56 MIL/uL — ABNORMAL LOW (ref 4.22–5.81)
RDW: 13 % (ref 11.5–15.5)
WBC: 5.9 10*3/uL (ref 4.0–10.5)
nRBC: 0 % (ref 0.0–0.2)

## 2023-08-11 LAB — RESP PANEL BY RT-PCR (RSV, FLU A&B, COVID)  RVPGX2
Influenza A by PCR: NEGATIVE
Influenza B by PCR: NEGATIVE
Resp Syncytial Virus by PCR: NEGATIVE
SARS Coronavirus 2 by RT PCR: NEGATIVE

## 2023-08-11 LAB — BASIC METABOLIC PANEL
Anion gap: 11 (ref 5–15)
BUN: 7 mg/dL (ref 6–20)
CO2: 24 mmol/L (ref 22–32)
Calcium: 8.6 mg/dL — ABNORMAL LOW (ref 8.9–10.3)
Chloride: 100 mmol/L (ref 98–111)
Creatinine, Ser: 0.89 mg/dL (ref 0.61–1.24)
GFR, Estimated: >60 mL/min (ref >60)
Glucose, Bld: 160 mg/dL — ABNORMAL HIGH (ref 70–99)
Potassium: 3.5 mmol/L (ref 3.5–5.1)
Sodium: 135 mmol/L (ref 135–145)

## 2023-08-11 MED ORDER — IPRATROPIUM-ALBUTEROL 0.5-2.5 (3) MG/3ML IN SOLN
3.0000 mL | Freq: Once | RESPIRATORY_TRACT | Status: AC
  Administered 2023-08-11: 3 mL via RESPIRATORY_TRACT
  Filled 2023-08-11: qty 3

## 2023-08-11 MED ORDER — PREDNISONE 20 MG PO TABS: 40.0000 mg | ORAL_TABLET | Freq: Every day | ORAL | 0 refills | Status: AC

## 2023-08-11 MED ORDER — CEFUROXIME AXETIL 500 MG PO TABS: 500.0000 mg | ORAL_TABLET | Freq: Two times a day (BID) | ORAL | 0 refills | Status: DC

## 2023-08-11 MED ORDER — TRIAMCINOLONE ACETONIDE 55 MCG/ACT NA AERO: 2.0000 | INHALATION_SPRAY | Freq: Every day | NASAL | 12 refills | Status: DC

## 2023-08-11 NOTE — ED Triage Notes (Signed)
Pt states he was seen at [facility] 1 week ago and started on zpack. Pt states he is still congested, body aches, cough.

## 2023-08-11 NOTE — ED Provider Notes (Signed)
[place] EMERGENCY DEPARTMENT AT [facility] Provider Note   CSN: [id number] Arrival date & time: 08/11/23  6578     History  Chief Complaint  Patient presents with   Generalized Body Aches    Ethan [name] is a 52 y.o. male.  HPI   52 year old male presents emergency department with complaints of cough, nasal congestion, body aches, subjective fever at home.  Patient states he has been ill for the past 3 or so weeks.  Was seen in late August for similar complaints and diagnosed with bronchopneumonia and sent home with azithromycin as well as recommended over-the-counter medications.  He had negative viral testing at that time.  Patient states that he has been compliant with medications and reports no improvement of symptoms.  States that he still been with cough sometimes productive in nature with yellow sputum.  Reports feeling hot at times but no documented fever.  Denies any chest pain, shortness of breath, abdominal pain, nausea.  He does report 2 episodes of posttussive emesis over the course of his illness.  Past medical history significant for hypertension, hypokalemia, diverticulitis, asthma, GERD  Home Medications Prior to Admission medications   Medication Sig Start Date End Date Taking? Authorizing Provider  cefUROXime (CEFTIN) 500 MG tablet Take 1 tablet (500 mg total) by mouth 2 (two) times daily with a meal. 08/11/23  Yes Sherian Maroon A, PA  predniSONE (DELTASONE) 20 MG tablet Take 2 tablets (40 mg total) by mouth daily with breakfast for 5 days. 08/11/23 08/16/23 Yes Sherian Maroon A, PA  triamcinolone (NASACORT) 55 MCG/ACT AERO nasal inhaler Place 2 sprays into the nose daily. 08/11/23  Yes Sherian Maroon A, PA  albuterol (PROVENTIL HFA;VENTOLIN HFA) 108 (90 BASE) MCG/ACT inhaler Inhale 2 puffs into the lungs every 6 (six) hours as needed for wheezing or shortness of breath.    [provider]  albuterol (PROVENTIL) (2.5 MG/3ML) 0.083% nebulizer  solution Take 3 mLs (2.5 mg total) by nebulization every 6 (six) hours as needed for wheezing or shortness of breath. 09/23/14   Ward, Layla Maw, DO  allopurinol (ZYLOPRIM) 300 MG tablet Take 300 mg by mouth. 06/07/16   [provider]  ALPRAZolam Prudy Feeler) 1 MG tablet Take 1 mg by mouth 4 (four) times daily as needed for anxiety.     [provider]  amLODipine (NORVASC) 10 MG tablet Take 10 mg by mouth daily.    [provider]  benzonatate (TESSALON) 100 MG capsule Take 1 capsule (100 mg total) by mouth every 8 (eight) hours. 11/22/22   Linwood Dibbles, MD  dexlansoprazole (DEXILANT) 60 MG capsule Take 1 capsule (60 mg total) by mouth daily. 01/16/14   Tiffany Kocher, PA-C  HYDROcodone-acetaminophen (NORCO/VICODIN) 5-325 MG per tablet Take 1 tablet by mouth every 4 (four) hours as needed. 06/02/15   Burgess Amor, PA-C  lidocaine (XYLOCAINE) 2 % solution Use as directed 5 mLs in the mouth or throat every 6 (six) hours as needed for mouth pain. Gargle and spit 5 mL every 6 hours as needed for throat pain or discomfort. 04/18/23   Leath-Warren, Sadie Haber, NP  lisinopril-hydrochlorothiazide (PRINZIDE,ZESTORETIC) 20-25 MG per tablet Take 1 tablet by mouth daily.    [provider]  Multiple Vitamin (MULTIVITAMIN WITH MINERALS) TABS tablet Take 1 tablet by mouth daily.    [provider]  ondansetron (ZOFRAN-ODT) 8 MG disintegrating tablet Take 1 tablet (8 mg total) by mouth every 8 (eight) hours as needed for nausea  or vomiting. 11/22/22   Linwood Dibbles, MD  oxyCODONE-acetaminophen (PERCOCET/ROXICET) 5-325 MG per tablet Take 1 tablet by mouth every 6 (six) hours as needed for severe pain.    [provider]  Wheat Dextrin (BENEFIBER PO) Take 1 tablet by mouth daily.     [provider]      Allergies    Penicillins    Review of Systems   Review of Systems  All other systems reviewed and are negative.   Physical Exam Updated Vital Signs BP (!)  169/102 (BP Location: Right Arm)   Pulse 76   Temp 98.5 F (36.9 C) (Oral)   Resp 18   Ht 6\' 3"  (1.905 m)   Wt 131.5 kg   SpO2 97%   BMI 36.25 kg/m  Physical Exam Vitals and nursing note reviewed.  Constitutional:      General: He is not in acute distress.    Appearance: He is well-developed.  HENT:     Head: Normocephalic and atraumatic.     Nose: Congestion and rhinorrhea present.     Mouth/Throat:     Comments: Mild posterior pharyngeal erythema.  Uvula midline rise symmetric with phonation.  Tonsils are 0-1+ bilaterally no obvious exudate.  No sublingual or submandibular swelling. Eyes:     Conjunctiva/sclera: Conjunctivae normal.  Cardiovascular:     Rate and Rhythm: Normal rate and regular rhythm.  Pulmonary:     Effort: Pulmonary effort is normal. No respiratory distress.     Breath sounds: Wheezing present.     Comments: Diffuse wheeze bilaterally.  Rales auscultated left lower lung field. Abdominal:     Palpations: Abdomen is soft.     Tenderness: There is no abdominal tenderness. There is no guarding.  Musculoskeletal:        General: No swelling.     Cervical back: Normal range of motion and neck supple. No rigidity.  Skin:    General: Skin is warm and dry.     Capillary Refill: Capillary refill takes less than 2 seconds.  Neurological:     Mental Status: He is alert.  Psychiatric:        Mood and Affect: Mood normal.     ED Results / Procedures / Treatments   Labs (all labs ordered are listed, but only abnormal results are displayed) Labs Reviewed  CBC WITH DIFFERENTIAL/PLATELET - Abnormal; Notable for the following components:      Result Value   RBC 3.56 (*)    Hemoglobin 12.0 (*)    HCT 36.2 (*)    MCV 101.7 (*)    All other components within normal limits  BASIC METABOLIC PANEL - Abnormal; Notable for the following components:   Glucose, Bld 160 (*)    Calcium 8.6 (*)    All other components within normal limits  RESP PANEL BY RT-PCR (RSV,  FLU A&B, COVID)  [id number]    EKG None  Radiology DG Chest 2 View  Result Date: 08/11/2023 CLINICAL DATA:  Cough. EXAM: CHEST - 2 VIEW COMPARISON:  07/21/2023 FINDINGS: Heart size and mediastinal contours are within normal limits. No pleural fluid or interstitial edema. No airspace opacities identified. The visualized osseous structures are unremarkable. IMPRESSION: No acute cardiopulmonary abnormalities. Electronically Signed   By: Signa Kell M.D.   On: 08/11/2023 11:45    Procedures Procedures    Medications Ordered in ED Medications  ipratropium-albuterol (DUONEB) 0.5-2.5 (3) MG/3ML nebulizer solution 3 mL (3 mLs Nebulization Given 08/11/23 1128)    ED  Course/ Medical Decision Making/ A&P                                 Medical Decision Making Amount and/or Complexity of Data Reviewed Labs: ordered. Radiology: ordered.  Risk OTC drugs. Prescription drug management.   This patient presents to the ED for concern of cough, nasal congestion, sore throat, this involves an extensive number of treatment options, and is a complaint that carries with it a high risk of complications and morbidity.  The differential diagnosis includes COVID, influenza, RSV, pneumonia   Co morbidities that complicate the patient evaluation  See HPI   Additional history obtained:  Additional history obtained from EMR External records from outside source obtained and reviewed including hospital records   Lab Tests:  I Ordered, and personally interpreted labs.  The pertinent results include: No leukocytosis.  Evidence of anemia with a hemoglobin of 12.0 of which is macrocytic in nature and near patient's baseline.  Platelets within range.  Mild hypocalcemia of 8.6 but otherwise, electrolytes within normal limits.  No renal dysfunction.  Respiratory viral panel negative.   Imaging Studies ordered:  I ordered imaging studies including chest x-ray I independently visualized and interpreted  imaging which showed no acute cardiopulmonary abnormalities. I agree with the radiologist interpretation   Cardiac Monitoring: / EKG:  The patient was maintained on a cardiac monitor.  I personally viewed and interpreted the cardiac monitored which showed an underlying rhythm of: Sinus rhythm   Consultations Obtained:  N/a   Problem List / ED Course / Critical interventions / Medication management  Cough, sore throat, nasal congestion I ordered medication including DuoNeb   Reevaluation of the patient after these medicines showed that the patient improved I have reviewed the patients home medicines and have made adjustments as needed   Social Determinants of Health:  Chronic cigarette/vape use.  Denies illicit substance use.   Test / Admission - Considered:  Cough, sore throat, nasal congestion Vitals signs significant for hypertension blood pressure 169/102. Otherwise within normal range and stable throughout visit. Laboratory/imaging studies significant for: See above 52 year old male presents emergency department with complaints of dry cough, sore throat, nasal congestion for the past few weeks.  Patient was seen a few weeks ago and diagnosed with bronchopneumonia placed on azithromycin and given treatment for COPD/asthma exacerbation with albuterol and prednisone.  Noted initial improvement of symptoms but symptoms have been persistent prompting visit again to the emergency department today.  On exam, patient with wheeze bilateral lung fields with possible Rales left lower lung field.  Otherwise, exam benign.  Laboratory studies reassuring.  Chest x-ray without signs of obvious pneumonia.  Given concern for Rales in left lower lung field as well as chronicity of symptoms, will treat empirically for pneumonia and have him follow-up with primary care in the outpatient setting.  Symptomatic therapy recommended as discussed in AVS.  Treatment plan discussed at length with patient and  he acknowledged nursing was agreeable to said plan.  Patient overall well-appearing, afebrile in no acute distress.   Worrisome signs and symptoms were discussed with the patient, and the patient acknowledged understanding to return to the ED if noticed. Patient was stable upon discharge.          Final Clinical Impression(s) / ED Diagnoses Final diagnoses:  Acute cough  Body aches  Nasal congestion    Rx / DC Orders ED Discharge Orders  None         Peter Garter, [place] 08/11/23 1225    Terrilee Files, MD 08/11/23 [phone]

## 2023-08-11 NOTE — Discharge Instructions (Addendum)
As discussed, workup today overall reassuring.  He tested negative for COVID, flu, RSV.  Chest x-ray without signs of obvious pneumonia.  Given wheeze on exam, will treat with continued use of breathing treatments at home as well as short course of steroids.  Did clinically appreciate some crackles in her left lower lung field concerning for possible pneumonia.  Will treat this with broader antibiotics in the outpatient setting.  Will also treat nasal congestion with nasal steroid spray.  Recommend continue use of allergy medicine at home.  Recommend follow-up with your primary care for reassessment of your symptoms.  Please not hesitate to return to emergency room if there are worrisome signs and symptoms we discussed become apparent.

## 2023-10-20 ENCOUNTER — Emergency Department (HOSPITAL_COMMUNITY): Payer: 59

## 2023-10-20 ENCOUNTER — Emergency Department (HOSPITAL_COMMUNITY)
Admission: EM | Admit: 2023-10-20 | Discharge: 2023-10-20 | Disposition: A | Discharge status: Home / Self Care | Payer: 59 | Attending: Emergency Medicine | Admitting: Emergency Medicine

## 2023-10-20 ENCOUNTER — Encounter (HOSPITAL_COMMUNITY): Payer: Self-pay

## 2023-10-20 ENCOUNTER — Other Ambulatory Visit: Payer: Self-pay

## 2023-10-20 DIAGNOSIS — Z5329 Procedure and treatment not carried out because of patient's decision for other reasons: Secondary | ICD-10-CM | POA: Diagnosis not present

## 2023-10-20 DIAGNOSIS — K529 Noninfective gastroenteritis and colitis, unspecified: Secondary | ICD-10-CM | POA: Insufficient documentation

## 2023-10-20 DIAGNOSIS — I10 Essential (primary) hypertension: Secondary | ICD-10-CM | POA: Insufficient documentation

## 2023-10-20 DIAGNOSIS — R221 Localized swelling, mass and lump, neck: Secondary | ICD-10-CM

## 2023-10-20 DIAGNOSIS — R109 Unspecified abdominal pain: Secondary | ICD-10-CM | POA: Diagnosis present

## 2023-10-20 HISTORY — DX: Pure hypercholesterolemia, unspecified: E78.00

## 2023-10-20 LAB — COMPREHENSIVE METABOLIC PANEL
ALT: 31 U/L (ref 0–44)
AST: 33 U/L (ref 15–41)
Albumin: 3.3 g/dL — ABNORMAL LOW (ref 3.5–5.0)
Alkaline Phosphatase: 50 U/L (ref 38–126)
Anion gap: 10 (ref 5–15)
BUN: 10 mg/dL (ref 6–20)
CO2: 22 mmol/L (ref 22–32)
Calcium: 8.8 mg/dL — ABNORMAL LOW (ref 8.9–10.3)
Chloride: 101 mmol/L (ref 98–111)
Creatinine, Ser: 1 mg/dL (ref 0.61–1.24)
GFR, Estimated: >60 mL/min (ref >60)
Glucose, Bld: 137 mg/dL — ABNORMAL HIGH (ref 70–99)
Potassium: 3.3 mmol/L — ABNORMAL LOW (ref 3.5–5.1)
Sodium: 133 mmol/L — ABNORMAL LOW (ref 135–145)
Total Bilirubin: 1 mg/dL (ref <1.2)
Total Protein: 7.3 g/dL (ref 6.5–8.1)

## 2023-10-20 LAB — CBC
HCT: 37.4 % — ABNORMAL LOW (ref 39.0–52.0)
Hemoglobin: 12.5 g/dL — ABNORMAL LOW (ref 13.0–17.0)
MCH: 33.2 pg (ref 26.0–34.0)
MCHC: 33.4 g/dL (ref 30.0–36.0)
MCV: 99.5 fL (ref 80.0–100.0)
Platelets: 209 10*3/uL (ref 150–400)
RBC: 3.76 MIL/uL — ABNORMAL LOW (ref 4.22–5.81)
RDW: 12.8 % (ref 11.5–15.5)
WBC: 7.7 10*3/uL (ref 4.0–10.5)
nRBC: 0 % (ref 0.0–0.2)

## 2023-10-20 LAB — TROPONIN I (HIGH SENSITIVITY)
Troponin I (High Sensitivity): 7 ng/L (ref <18)
Troponin I (High Sensitivity): 7 ng/L (ref <18)

## 2023-10-20 LAB — C DIFFICILE QUICK SCREEN W PCR REFLEX
C Diff antigen: NEGATIVE
C Diff interpretation: NOT DETECTED
C Diff toxin: NEGATIVE

## 2023-10-20 LAB — LIPASE, BLOOD: Lipase: 44 U/L (ref 11–51)

## 2023-10-20 LAB — GROUP A STREP BY PCR: Group A Strep by PCR: NOT DETECTED

## 2023-10-20 MED ORDER — SODIUM CHLORIDE 0.9 % IV BOLUS
1000.0000 mL | Freq: Once | INTRAVENOUS | Status: AC
  Administered 2023-10-20: 1000 mL via INTRAVENOUS

## 2023-10-20 MED ORDER — FAMOTIDINE IN NACL 20-0.9 MG/50ML-% IV SOLN
20.0000 mg | Freq: Once | INTRAVENOUS | Status: AC
  Administered 2023-10-20: 20 mg via INTRAVENOUS
  Filled 2023-10-20: qty 50

## 2023-10-20 MED ORDER — ALUM & MAG HYDROXIDE-SIMETH 200-200-20 MG/5ML PO SUSP
30.0000 mL | Freq: Once | ORAL | Status: AC
  Administered 2023-10-20: 30 mL via ORAL
  Filled 2023-10-20: qty 30

## 2023-10-20 MED ORDER — HYDROCHLOROTHIAZIDE 12.5 MG PO TABS: 25.0000 mg | ORAL_TABLET | Freq: Every day | ORAL | 3 refills | Status: DC

## 2023-10-20 MED ORDER — METRONIDAZOLE 500 MG PO TABS: 500.0000 mg | ORAL_TABLET | Freq: Two times a day (BID) | ORAL | 0 refills | Status: DC

## 2023-10-20 MED ORDER — KETOROLAC TROMETHAMINE 15 MG/ML IJ SOLN
15.0000 mg | Freq: Once | INTRAMUSCULAR | Status: AC
  Administered 2023-10-20: 15 mg via INTRAVENOUS
  Filled 2023-10-20: qty 1

## 2023-10-20 MED ORDER — CIPROFLOXACIN HCL 500 MG PO TABS: 500.0000 mg | ORAL_TABLET | Freq: Two times a day (BID) | ORAL | 0 refills | Status: DC

## 2023-10-20 MED ORDER — METHYLPREDNISOLONE SODIUM SUCC 125 MG IJ SOLR
125.0000 mg | Freq: Once | INTRAMUSCULAR | Status: AC
  Administered 2023-10-20: 125 mg via INTRAVENOUS
  Filled 2023-10-20: qty 2

## 2023-10-20 MED ORDER — IOHEXOL 300 MG/ML  SOLN
150.0000 mL | Freq: Once | INTRAMUSCULAR | Status: AC | PRN
  Administered 2023-10-20: 150 mL via INTRAVENOUS

## 2023-10-20 NOTE — Discharge Instructions (Addendum)
You were seen for your throat swelling in the emergency department.  Your CT scan showed that you also have colitis.  At home, please STOP taking lisinopril in case it is causing your swelling and start taking the hydrochlorothiazide.  Start taking the antibiotics for your colitis.  Check your MyChart online for the results of any tests that had not resulted by the time you left the emergency department.   Follow-up with your primary doctor in 1 week regarding your visit to have your labs checked after starting the hydrochlorothiazide.  Follow-up with GI in 1 to 2 weeks about your colitis.  Return immediately to the emergency department if you experience any of the following: Difficulty breathing, severe abdominal pain, fevers, or any other concerning symptoms.    Thank you for visiting our Emergency Department. It was a pleasure taking care of you today.

## 2023-10-20 NOTE — ED Provider Notes (Signed)
  Physical Exam  BP 124/88   Pulse 89   Temp 98.2 F (36.8 C) (Oral)   Resp 20   Ht 6\' 3"  (1.905 m)   Wt 131.5 kg   SpO2 97%   BMI 36.25 kg/m   Physical Exam  Procedures  Procedures  ED Course / MDM   Clinical Course as of 10/21/23 1002  Thu Oct 20, 2023  4098 Assumed care from Dr Pilar Plate. 52 yo M hx of diverticulitis who presented with abdominal pain and diarrhea. Also having a strange sensation in the back of his throat. When he turns his head to the left and lays down it feels like his throat is closing up. Awaiting reads of ct of the neck and abdomen/pelvis.  [RP]  0734 CT Soft Tissue Neck W Contrast Positive for moderate to severe low-density swelling of the Uvula. Remaining soft palate and tonsils appear enlarged to a lesser extent. There is mild inflammation in the right parapharyngeal space, but no abscess or other complicating features. The Epiglottis, larynx, and visible trachea remain normal. [RP]  0735 CT ABDOMEN PELVIS W CONTRAST Shows acute colitis [RP]  0802 Rapid strep test negative.  Reassessed the patient.  Says that he has had 9 loose bowel movements per day since Monday.  No blood.  Will send stool for C. difficile since his CT scan shows possible colitis.  Suspect infectious etiology is most likely.  Reexamined and does have swelling of the soft palate and uvula.  Says that it has not worsened since coming to the emergency department.  Started approximately 2:30 AM this morning.  Is tolerating his secretions.  No voice changes currently.  Not having any dyspnea or stridor on exam.  Says it only is bothering him if he lays flat.  He is on lisinopril.  Unclear if this is potentially ACE inhibitor associated angioedema so we will change him from his lisinopril to hydrochlorothiazide since he is already on amlodipine. [RP]  [id number] The patient has decided to leave against medical advice because of work.  Did explain that we can give him a work note. They have adequate capacity  to make medical decisions at this time. The risks have been explained to the patient, including worsening illness, chronic pain, permanent disability and death. The patient was able to understand and state the risks and benefits of hospital admission and they had the opportunity to ask questions. The patient was treated to the extent that they would allow and knows that they may return for care at any time. Will dc with Cipro and Flagyl for his colitis.  Started him on hydrochlorothiazide and told to stop his lisinopril.  Does have a C. difficile that still pending and will have him check his MyChart for the results.   [RP]    Clinical Course User Index [RP] Rondel Baton, MD   Medical Decision Making Amount and/or Complexity of Data Reviewed Labs: ordered. Radiology: ordered. Decision-making details documented in ED Course. ECG/medicine tests: ordered.  Risk OTC drugs. Prescription drug management.      Rondel Baton, MD 10/21/23 1002

## 2023-10-20 NOTE — ED Provider Notes (Signed)
[facility]-EMERGENCY DEPT [facility] Emergency Department Provider Note MRN:  [id number]  Arrival date & time: 10/20/23     Chief Complaint   Abdominal Pain   History of Present Illness   Ethan [name] is a 52 y.o. year-old male with a history of hypertension, diverticulitis presenting to the ED with chief complaint of abdominal pain.  Persistent diarrhea for 3 or 4 days, mild abdominal discomfort, generalized.  This evening woke up with discomfort in the back of the throat that was worse when laying flat.  This concerned him, felt like his throat was closing, but this goes away when sitting up.  Denies chest pain or shortness of breath.  Review of Systems  A thorough review of systems was obtained and all systems are negative except as noted in the HPI and PMH.   Patient's Health History    Past Medical History:  Diagnosis Date   Diverticulitis    Gout    Hypercholesterolemia    Hypertension     Past Surgical History:  Procedure Laterality Date   WRIST ARTHROCENTESIS      History reviewed. No pertinent family history.  Social History   Socioeconomic History   Marital status: Single    Spouse name: Not on file   Number of children: Not on file   Years of education: Not on file   Highest education level: Not on file  Occupational History   Not on file  Tobacco Use   Smoking status: Every Day    Current packs/day: 0.50    Average packs/day: 0.5 packs/day for 34.9 years (17.4 ttl pk-yrs)    Types: Cigarettes    Start date: 61   Smokeless tobacco: Never  Vaping Use   Vaping status: Never Used  Substance and Sexual Activity   Alcohol use: Yes    Alcohol/week: 1.0 standard drink of alcohol    Types: 1 Cans of beer per week   Drug use: Never   Sexual activity: Not Currently  Other Topics Concern   Not on file  Social History Narrative   Not on file   Social Determinants of Health   Financial Resource Strain: Not on file  Food Insecurity: Not on file   Transportation Needs: Not on file  Physical Activity: Not on file  Stress: Not on file  Social Connections: Not on file  Intimate Partner Violence: Not on file     Physical Exam   Vitals:   10/20/23 0445 10/20/23 0600  BP: 122/89 124/88  Pulse: 99 89  Resp:  20  Temp:    SpO2: 95% 97%    CONSTITUTIONAL: Well-appearing, NAD NEURO/PSYCH:  Alert and oriented x 3, no focal deficits EYES:  eyes equal and reactive ENT/NECK:  no LAD, no JVD CARDIO: Regular rate, well-perfused, normal S1 and S2 PULM:  CTAB no wheezing or rhonchi GI/GU:  non-distended, non-tender MSK/SPINE:  No gross deformities, no edema SKIN:  no rash, atraumatic   *Additional and/or pertinent findings included in MDM below  Diagnostic and Interventional Summary    EKG Interpretation Date/Time:  Thursday October 20 2023 04:57:44 EST Ventricular Rate:  94 PR Interval:  150 QRS Duration:  90 QT Interval:  336 QTC Calculation: 421 R Axis:   53  Text Interpretation: Sinus rhythm Abnormal R-wave progression, early transition Confirmed by Kennis Carina [phone]) on 10/20/2023 5:00:04 AM       Labs Reviewed  CBC - Abnormal; Notable for the following components:      Result Value  RBC 3.76 (*)    Hemoglobin 12.5 (*)    HCT 37.4 (*)    All other components within normal limits  COMPREHENSIVE METABOLIC PANEL - Abnormal; Notable for the following components:   Sodium 133 (*)    Potassium 3.3 (*)    Glucose, Bld 137 (*)    Calcium 8.8 (*)    Albumin 3.3 (*)    All other components within normal limits  GROUP A STREP BY PCR  LIPASE, BLOOD  TROPONIN I (HIGH SENSITIVITY)  TROPONIN I (HIGH SENSITIVITY)    CT ABDOMEN PELVIS W CONTRAST  Final Result    DG Chest Port 1 View  Final Result    CT Soft Tissue Neck W Contrast    (Results Pending)    Medications  alum & mag hydroxide-simeth (MAALOX/MYLANTA) 200-200-20 MG/5ML suspension 30 mL (30 mLs Oral Given 10/20/23 0456)  sodium chloride 0.9 % bolus  1,000 mL (1,000 mLs Intravenous New Bag/Given 10/20/23 0458)  famotidine (PEPCID) IVPB 20 mg premix (0 mg Intravenous Stopped 10/20/23 0527)  ketorolac (TORADOL) 15 MG/ML injection 15 mg (15 mg Intravenous Given 10/20/23 0459)  methylPREDNISolone sodium succinate (SOLU-MEDROL) 125 mg/2 mL injection 125 mg (125 mg Intravenous Given 10/20/23 0622)  iohexol (OMNIPAQUE) 300 MG/ML solution 150 mL (150 mLs Intravenous Contrast Given 10/20/23 1610)     Procedures  /  Critical Care Procedures  ED Course and Medical Decision Making  Initial Impression and Ddx Suspect gastroenteritis with the throat discomfort related to GERD.  No focal abdominal tenderness to suggest diverticulitis or appendicitis.  Overall reassuring exam.  Given the amount of diarrhea will obtain screening labs to evaluate for electrolyte disturbance, acute kidney injury.  Past medical/surgical history that increases complexity of ED encounter: Diverticulitis  Interpretation of Diagnostics I personally reviewed the laboratory assessment and my interpretation is as follows: No significant blood count or electrolyte disturbance  CT imaging pending  Patient Reassessment and Ultimate Disposition/Management   Patient having continued throat pain with a sensation that his breathing occludes when he is laying flat, especially when looking to the left.  Continued abdominal discomfort.  Awaiting CT imaging, signed out to oncoming provider at shift change.  Patient management required discussion with the following services or consulting groups:  None  Complexity of Problems Addressed Acute illness or injury that poses threat of life of bodily function  Additional Data Reviewed and Analyzed Further history obtained from: None  Additional Factors Impacting ED Encounter Risk None  Elmer Sow. Pilar Plate, MD [facility] Emergency Medicine [facility] [email or web address]  Final Clinical Impressions(s) / ED Diagnoses      ICD-10-CM   1. Abdominal pain, unspecified abdominal location  R10.9       ED Discharge Orders     None        Discharge Instructions Discussed with and Provided to Patient:   Discharge Instructions   None      Sabas Sous, MD 10/20/23 [phone]

## 2023-10-20 NOTE — ED Triage Notes (Signed)
Pt complaining of having diarrhea since Monday approx 9 bowel movements a day. Yesterday he started having some stomach cramping which he started taking benefiber for. This morning he woke up and it feels like something is in the back of his throat.

## 2023-10-24 ENCOUNTER — Encounter: Payer: Self-pay | Admitting: Gastroenterology

## 2023-10-24 ENCOUNTER — Ambulatory Visit (INDEPENDENT_AMBULATORY_CARE_PROVIDER_SITE_OTHER): Payer: 59 | Admitting: Gastroenterology

## 2023-10-24 VITALS — BP 136/89 | HR 88 | Temp 98.6°F | Ht 75.0 in | Wt 301.6 lb

## 2023-10-24 DIAGNOSIS — R221 Localized swelling, mass and lump, neck: Secondary | ICD-10-CM

## 2023-10-24 DIAGNOSIS — K529 Noninfective gastroenteritis and colitis, unspecified: Secondary | ICD-10-CM

## 2023-10-24 DIAGNOSIS — K219 Gastro-esophageal reflux disease without esophagitis: Secondary | ICD-10-CM | POA: Diagnosis not present

## 2023-10-24 DIAGNOSIS — K122 Cellulitis and abscess of mouth: Secondary | ICD-10-CM | POA: Diagnosis not present

## 2023-10-24 NOTE — Patient Instructions (Addendum)
We are scheduling you for colonoscopy in the near future with Dr. Jena Gauss.  Pending our schedules this may be closer to the beginning of January.  Our office will call you once we have openings.  Continue taking your ciprofloxacin and Flagyl until it is finished.  If you continue to have some looser stools after finishing antibiotics I would recommend that you start a probiotic over-the-counter and if still having issues then please let me know.  If her diarrhea improves 2 weeks after finishing antibiotics then you can resume your Benefiber.  We will determine your next office visit follow-up after your colonoscopy is completed.  I hope you have a wonderful Thanksgiving and Christmas and a happy new year if I do not see you before then!  It was a pleasure to see you today. I want to create trusting relationships with patients. If you receive a survey regarding your visit,  I greatly appreciate you taking time to fill this out on paper or through your MyChart. I value your feedback.  Brooke Bonito, MSN, FNP-BC, AGACNP-BC [facility]

## 2023-10-24 NOTE — Progress Notes (Signed)
GI Office Note    Referring Provider: [facility] Primary Care Physician:  [facility]  Primary Gastroenterologist: Gerrit Friends.Rourk, MD  Chief Complaint   Chief Complaint  Patient presents with   New Patient (Initial Visit)    Pt referred for abd pain and throat swelling. Pt advises that his throat is much better after antibiotics   History of Present Illness   Ethan Riniker. is a 52 y.o. male presenting today at the request of [facility] for throat swelling and abdominal pain.   EGD 11/15/2023: -Hiatal hernia -Otherwise normal examination -Advised to proceed with CT scan to evaluate further causes of hiccups and start acid suppression with Dexilant 60 mg once daily  Patient last seen in the office in 2015.  He was being seen for intractable hiccups and reflux.  He was noted to have a CT scan in January 2015 showed a small hiatal hernia and sigmoid diverticulosis, otherwise normal.  At 1 point during his evaluation he had reported that Dexilant helped some with his hiccups as far as frequency.  Reported regular bowel movements and no melena or rectal bleeding.  He is not drinking soft drinks but substituted it with carbonated water which she was drinking about 3 L/day.Marland Kitchen  His wife had reported that his hiccups were worse with stress.  Typical heartburn was reportedly well-controlled with Dexilant.  CT scan of the abdomen pelvis with contrast was ordered however denied by insurance.  His blood sugar was noted to be elevated and discussed that pending CT results and lab workup if you continue to have symptoms that he need to consider ENT evaluation to rule out vagus and phrenic nerve irritation.  In September 2015 patient called back reporting ongoing hiccups and requesting medication.  He ultimately was referred to ENT as well as tertiary GI to discuss his complaints and consider further evaluation.  He was seen by [facility] in November 2015.  Per this note he has tried Thorazine, Carafate, Lamictal, and baclofen without any significant improvement in his hiccups.  He has also been seen by neurology for evaluation of his hiccups who felt as though there was no CNS cause for his hiccups given his head CT was normal and neurological exam normal.  They recommended high-dose PPI at same time as Carafate and obtain esophageal pH/impedance and manometry to see if his symptoms correlate with reflux or esophageal spasm.  He is encouraged to stop drinking alcohol and quit smoking as well as avoiding carbonated beverages.  Underwent upper GI series in August 2017 with delay in passage of barium of low amplitude primary peristalsis, minimal secondary peristalsis.  Findings may relate to nonspecific esophageal motility disorder and or the sequelae of chronic GERD.  Per review of chart does not appear that patient completed pH impedance testing or manometry testing off PPI.  It appears patient canceled his appointment.  Recent ED visit 10/20/2023 for mild abdominal discomfort and persistent diarrhea for 3-4 days.  Also woke up with discomfort in the back of his throat that was worse with lying flat, feeling like his throat was closing but goes away when sitting up.  CT imaging as noted below.  There was no abdominal tenderness to suggest diverticulitis or appendicitis.  Suspected gastroenteritis and throat discomfort related to GERD.  Rapid strep negative.  Patient reported 9 loose bowel movement since Monday prior.  Stool studies sent.  He was switched from lisinopril to hydrochlorothiazide  given concern for possible ACE inhibitor associated angioedema given soft tissue swelling of the soft palate and uvula.  Patient left AGAINST MEDICAL ADVICE because of his work.  He is discharged with Cipro and Flagyl for colitis.  C. difficile quick screen negative.  CT soft tissue neck with contrast 10/20/2023: -Positive for moderate to  severe low-density swelling of uvula, remaining soft palate and tonsils appear enlarged to a lesser extent. -Mild inflammation in the right parapharyngeal space, no abscess or other complicating features -Epiglottis, larynx, visible trachea remain normal. -Advised to consider uvulitis secondary to angioedema or infection   CT abdomen and pelvis with contrast 10/20/2023: -Diffuse colonic wall thickening with surrounding soft tissue haziness and hyperemia of the colon vasa recta.  Findings compatible with acute colitis -Subjective hepatic steatosis -Small to moderate hiatal hernia   Today:  Does take benefiber at home- reports he had been eating things he shouldn't. No fevers. No abdominal pain currently. Was having fullness and cramping. Stools still somewhat loose. Having about 3 per day. Was having 78 times per day. Currently consistency is about a bristol 4 and much more formed. Does not have to strain. Prior to all of this he does reports some straining. Typically takes about 4 capsules of benefiber daily. Has been trying to avoid seeds and such. Did have some nausea intermittently previous but no vomiting and no current symptoms. No dysphagia, chest pain, shortness of breath.   Has swelling in his throat. He was taking a separate antibiotic for his throat/uvula - keflex. PCP kept him ion the lisinopril. He is going to see her back in a month.   Taking omeprazole for his reflux. That has been controlled.   Has never had a colonoscopy. No family history.   Denies issues with blood sugars. Drinks about a 12 pack and a fifth in 2 days on some weekends.  Also smokes about a half a pack per day  Wt Readings from Last 3 Encounters:  10/24/23 (!) 301 lb 9.6 oz (136.8 kg)  10/20/23 290 lb (131.5 kg)  08/11/23 290 lb (131.5 kg)    Current Outpatient Medications  Medication Sig Dispense Refill   albuterol (PROVENTIL) (2.5 MG/3ML) 0.083% nebulizer solution Take 3 mLs (2.5 mg total) by  nebulization every 6 (six) hours as needed for wheezing or shortness of breath. 75 mL 1   amLODipine (NORVASC) 10 MG tablet Take 10 mg by mouth daily.     atropine 1 % ophthalmic solution Apply to eye.     cephALEXin (KEFLEX) 500 MG capsule Take 500 mg by mouth 4 (four) times daily.     Cholecalciferol 1.25 MG (50000 UT) TABS Take by mouth.     ciprofloxacin (CIPRO) 500 MG tablet Take 1 tablet (500 mg total) by mouth every 12 (twelve) hours. 10 tablet 0   lisinopril-hydrochlorothiazide (PRINZIDE,ZESTORETIC) 20-25 MG per tablet Take 1 tablet by mouth daily.     metroNIDAZOLE (FLAGYL) 500 MG tablet Take 1 tablet (500 mg total) by mouth 2 (two) times daily. 14 tablet 0   omeprazole (PRILOSEC) 40 MG capsule Take 40 mg by mouth daily.     pravastatin (PRAVACHOL) 40 MG tablet Take 40 mg by mouth daily.     prednisoLONE acetate (PRED FORTE) 1 % ophthalmic suspension Place into the right eye.     albuterol (PROVENTIL HFA;VENTOLIN HFA) 108 (90 BASE) MCG/ACT inhaler Inhale 2 puffs into the lungs every 6 (six) hours as needed for wheezing or shortness of breath. (Patient not taking:  Reported on 10/24/2023)     ALPRAZolam (XANAX) 1 MG tablet Take 1 mg by mouth 4 (four) times daily as needed for anxiety.  (Patient not taking: Reported on 10/24/2023)     hydrochlorothiazide (HYDRODIURIL) 12.5 MG tablet Take 2 tablets (25 mg total) by mouth daily. (Patient not taking: Reported on 10/24/2023) 30 tablet 3   No current facility-administered medications for this visit.    Past Medical History:  Diagnosis Date   Asthma    Cellulitis    hospitalized 07/2013   Diverticulitis    06/2012   Diverticulitis    Gout    Hypercholesterolemia    Hypertension    Hypokalemia    Pneumonia     Past Surgical History:  Procedure Laterality Date   ARM WOUND REPAIR / CLOSURE     ESOPHAGOGASTRODUODENOSCOPY N/A 11/14/2013   [id number] hernia; otherwise negative examination   WRIST ARTHROCENTESIS     WRIST SURGERY   12/2013   right    Family History  Problem Relation Age of Onset   Colon cancer Neg Hx     Allergies as of 10/24/2023 - Review Complete 10/24/2023  Allergen Reaction Noted   Penicillins Other (See Comments) 07/14/2011   Penicillins Rash 10/20/2023    Social History   Socioeconomic History   Marital status: Single    Spouse name: Not on file   Number of children: 3   Years of education: Not on file   Highest education level: Not on file  Occupational History   Not on file  Tobacco Use   Smoking status: Every Day    Current packs/day: 0.50    Average packs/day: 0.5 packs/day for 34.9 years (17.4 ttl pk-yrs)    Types: Cigarettes    Start date: 31   Smokeless tobacco: Never   Tobacco comments:    1/2 pack daily  Vaping Use   Vaping status: Never Used  Substance and Sexual Activity   Alcohol use: Yes    Alcohol/week: 1.0 standard drink of alcohol    Types: 1 Cans of beer per week    Comment: heavy use on weekends   Drug use: Never   Sexual activity: Not Currently    Birth control/protection: None  Other Topics Concern   Not on file  Social History Narrative   ** Merged History Encounter **       Social Determinants of Health   Financial Resource Strain: Not on file  Food Insecurity: Not on file  Transportation Needs: Not on file  Physical Activity: Not on file  Stress: Not on file  Social Connections: Not on file  Intimate Partner Violence: Not on file     Review of Systems   Gen: Denies any fever, chills, fatigue, weight loss, lack of appetite.  CV: Denies chest pain, heart palpitations, peripheral edema, syncope.  Resp: Denies shortness of breath at rest or with exertion. Denies wheezing or cough.  GI: see HPI GU : Denies urinary burning, urinary frequency, urinary hesitancy MS: Denies joint pain, muscle weakness, cramps, or limitation of movement.  Derm: Denies rash, itching, dry skin Psych: Denies depression, anxiety, memory loss, and  confusion Heme: Denies bruising, bleeding, and enlarged lymph nodes.  Physical Exam   BP 136/89   Pulse 88   Temp 98.6 F (37 C)   Ht 6\' 3"  (1.905 m)   Wt (!) 301 lb 9.6 oz (136.8 kg)   BMI 37.70 kg/m   General:   Alert and oriented. Pleasant and cooperative. Well-nourished  and well-developed.  Head:  Normocephalic and atraumatic. Eyes:  Without icterus, sclera clear and conjunctiva pink.  Ears:  Normal auditory acuity. Lungs:  Clear to auscultation bilaterally. No wheezes, rales, or rhonchi. No distress.  Heart:  S1, S2 present without murmurs appreciated.  Abdomen:  +BS, soft, non-tender and non-distended. No HSM noted. No guarding or rebound. No masses appreciated.  Rectal:  Deferred  Msk:  Symmetrical without gross deformities. Normal posture. Extremities:  Without edema. Neurologic:  Alert and  oriented x4;  grossly normal neurologically. Skin:  Intact without significant lesions or rashes. Psych:  Alert and cooperative. Normal mood and affect.  Assessment   Ethan Rhodes. is a 52 y.o. male with a history of hiccups, GERD, diverticulitis in 2013, HLD, HTN, and asthma presenting today for ED follow-up of abdominal pain and throat swelling  Colitis: Suspected to be secondary to infection.  Currently on Cipro and Flagyl given by the ED and outside of having 8 stools per day, he is having about 3 and is much more formed ([place] 4).advised for him to continue current course of Cipro and Flagyl and given his history of diverticulitis in the past I recommended for him to continue a low fiber diet up until about 2 weeks after he finishes antibiotics.  Also recommended a probiotic given he has had these antibiotics as well as Keflex given for uvulitis.  Typically has some mild intermittent constipation at baseline.  Has never had a colonoscopy.  He is currently due for colon cancer screening as noted below therefore we can evaluate for healing in the future. Less likely ischemic  but can not rule this out.   Screening colon cancer: No family history of colon cancer or colon polyps.  Has never had colonoscopy.  Currently overdue given he is 52 years of age and given his history of diverticulitis in the past.  He is agreeable to proceed.  GERD: Well-controlled with omeprazole 40 mg once daily.  Denies any nausea, vomiting, or dysphagia.  He did have some brief nausea intermittently with colitis episode but none since.   Throat swelling, Uvulitis: CT soft tissue neck with evidence of uvulitis and soft palate swelling.  Suspected be secondary to angioedema versus infection.  ED recommended for him to stop lisinopril and only take hydrochlorothiazide, however he went to see PCP day after ED visit and he was given a course of Keflex and states he no longer has any swelling in his throat or oropharynx and he will soon finish his course of Keflex.  PLAN   Complete course of ciprofloxacin and Flagyl for colitis. Proceed with colonoscopy with propofol by Dr. Jena Gauss in near future: the risks, benefits, and alternatives have been discussed with the patient in detail. The patient states understanding and desires to proceed. ASA 2 Continue omeprazole 40 mg once daily Resume fiber supplementation 2 weeks after finishing antibiotics Could consider probiotic for loose stools after receiving multiple antibiotics. Advised to call the office if he is having any worsening diarrhea after discontinuation of antibiotics Follow up TBD post colonoscopy   Brooke Bonito, MSN, FNP-BC, AGACNP-BC [facility]

## 2023-11-07 ENCOUNTER — Encounter: Payer: Self-pay | Admitting: *Deleted

## 2023-11-07 ENCOUNTER — Other Ambulatory Visit: Payer: Self-pay | Admitting: *Deleted

## 2023-11-07 DIAGNOSIS — Z1211 Encounter for screening for malignant neoplasm of colon: Secondary | ICD-10-CM

## 2023-11-07 MED ORDER — PEG 3350-KCL-NA BICARB-NACL 420 G PO SOLR: 4000.0000 mL | Freq: Once | ORAL | 0 refills | Status: AC

## 2023-11-07 NOTE — Addendum Note (Signed)
Addended by: Elinor Dodge on: 11/07/2023 08:37 AM   Modules accepted: Orders

## 2023-11-13 ENCOUNTER — Encounter (HOSPITAL_COMMUNITY): Payer: Self-pay

## 2023-11-13 ENCOUNTER — Emergency Department (HOSPITAL_COMMUNITY)
Admission: EM | Admit: 2023-11-13 | Discharge: 2023-11-13 | Disposition: A | Discharge status: Home / Self Care | Payer: 59 | Attending: Emergency Medicine | Admitting: Emergency Medicine

## 2023-11-13 ENCOUNTER — Other Ambulatory Visit: Payer: Self-pay

## 2023-11-13 ENCOUNTER — Emergency Department (HOSPITAL_COMMUNITY): Payer: 59

## 2023-11-13 DIAGNOSIS — I10 Essential (primary) hypertension: Secondary | ICD-10-CM | POA: Diagnosis not present

## 2023-11-13 DIAGNOSIS — Z7951 Long term (current) use of inhaled steroids: Secondary | ICD-10-CM | POA: Insufficient documentation

## 2023-11-13 DIAGNOSIS — Z79899 Other long term (current) drug therapy: Secondary | ICD-10-CM | POA: Insufficient documentation

## 2023-11-13 DIAGNOSIS — F17200 Nicotine dependence, unspecified, uncomplicated: Secondary | ICD-10-CM | POA: Diagnosis not present

## 2023-11-13 DIAGNOSIS — Z7952 Long term (current) use of systemic steroids: Secondary | ICD-10-CM | POA: Insufficient documentation

## 2023-11-13 DIAGNOSIS — J45909 Unspecified asthma, uncomplicated: Secondary | ICD-10-CM | POA: Insufficient documentation

## 2023-11-13 DIAGNOSIS — R42 Dizziness and giddiness: Secondary | ICD-10-CM | POA: Insufficient documentation

## 2023-11-13 DIAGNOSIS — R052 Subacute cough: Secondary | ICD-10-CM | POA: Diagnosis not present

## 2023-11-13 DIAGNOSIS — R059 Cough, unspecified: Secondary | ICD-10-CM | POA: Diagnosis present

## 2023-11-13 LAB — CBC
HCT: 39.1 % (ref 39.0–52.0)
Hemoglobin: 12.7 g/dL — ABNORMAL LOW (ref 13.0–17.0)
MCH: 32.6 pg (ref 26.0–34.0)
MCHC: 32.5 g/dL (ref 30.0–36.0)
MCV: 100.5 fL — ABNORMAL HIGH (ref 80.0–100.0)
Platelets: 216 10*3/uL (ref 150–400)
RBC: 3.89 MIL/uL — ABNORMAL LOW (ref 4.22–5.81)
RDW: 13.5 % (ref 11.5–15.5)
WBC: 7.4 10*3/uL (ref 4.0–10.5)
nRBC: 0 % (ref 0.0–0.2)

## 2023-11-13 LAB — BASIC METABOLIC PANEL
Anion gap: 11 (ref 5–15)
BUN: 13 mg/dL (ref 6–20)
CO2: 23 mmol/L (ref 22–32)
Calcium: 9.1 mg/dL (ref 8.9–10.3)
Chloride: 103 mmol/L (ref 98–111)
Creatinine, Ser: 0.94 mg/dL (ref 0.61–1.24)
GFR, Estimated: >60 mL/min (ref >60)
Glucose, Bld: 131 mg/dL — ABNORMAL HIGH (ref 70–99)
Potassium: 3.7 mmol/L (ref 3.5–5.1)
Sodium: 137 mmol/L (ref 135–145)

## 2023-11-13 MED ORDER — BENZONATATE 100 MG PO CAPS: 100.0000 mg | ORAL_CAPSULE | Freq: Three times a day (TID) | ORAL | 0 refills | Status: DC

## 2023-11-13 MED ORDER — PREDNISONE 20 MG PO TABS: 40.0000 mg | ORAL_TABLET | Freq: Every day | ORAL | 0 refills | Status: AC

## 2023-11-13 MED ORDER — ALBUTEROL SULFATE HFA 108 (90 BASE) MCG/ACT IN AERS
2.0000 | INHALATION_SPRAY | Freq: Once | RESPIRATORY_TRACT | Status: AC
  Administered 2023-11-13: 2 via RESPIRATORY_TRACT
  Filled 2023-11-13: qty 6.7

## 2023-11-13 MED ORDER — AZITHROMYCIN 250 MG PO TABS: ORAL_TABLET | ORAL | 0 refills | Status: DC

## 2023-11-13 NOTE — ED Triage Notes (Signed)
Pt has had a productive cough for a couple weeks now with green phlegm and started feeling very fatigued and light headed today.

## 2023-11-13 NOTE — ED Provider Notes (Signed)
[place] EMERGENCY DEPARTMENT AT [facility] Provider Note   CSN: [id number] Arrival date & time: 11/13/23  1616     History  Chief Complaint  Patient presents with   Cough    Ethan [name] Ethan [name]. is a 52 y.o. male who presents to the ER complaining of cough x several weeks plus fatigue and lightheadedness starting today. Cough is productive with green phlegm.  He states while he was sitting on the couch earlier he started to feel somewhat lightheaded, but did not pass out.  He has been taking cough syrup with some relief.  Denies any pain.   Cough      Home Medications Prior to Admission medications   Medication Sig Start Date End Date Taking? Authorizing Provider  azithromycin (ZITHROMAX Z-PAK) 250 MG tablet Take 2 tablets (500 mg) on day 1, after that take 1 tablet (250 mg) daily until complete. 11/13/23  Yes Jordanna Hendrie T, PA-C  benzonatate (TESSALON) 100 MG capsule Take 1 capsule (100 mg total) by mouth every 8 (eight) hours. 11/13/23  Yes Kalup Jaquith T, PA-C  predniSONE (DELTASONE) 20 MG tablet Take 2 tablets (40 mg total) by mouth daily with breakfast for 5 days. 11/13/23 11/18/23 Yes Nemiah Bubar T, PA-C  albuterol (PROVENTIL HFA;VENTOLIN HFA) 108 (90 BASE) MCG/ACT inhaler Inhale 2 puffs into the lungs every 6 (six) hours as needed for wheezing or shortness of breath. Patient not taking: Reported on 10/24/2023    [provider]  albuterol (PROVENTIL) (2.5 MG/3ML) 0.083% nebulizer solution Take 3 mLs (2.5 mg total) by nebulization every 6 (six) hours as needed for wheezing or shortness of breath. 09/23/14   Ward, Layla Maw, DO  ALPRAZolam Prudy Feeler) 1 MG tablet Take 1 mg by mouth 4 (four) times daily as needed for anxiety.  Patient not taking: Reported on 10/24/2023    [provider]  amLODipine (NORVASC) 10 MG tablet Take 10 mg by mouth daily.    [provider]  atropine 1 % ophthalmic solution Apply to eye. 09/06/23    [provider]  cephALEXin (KEFLEX) 500 MG capsule Take 500 mg by mouth 4 (four) times daily.    [provider]  Cholecalciferol 1.25 MG (50000 UT) TABS Take by mouth.    [provider]  ciprofloxacin (CIPRO) 500 MG tablet Take 1 tablet (500 mg total) by mouth every 12 (twelve) hours. 10/20/23   Rondel Baton, MD  hydrochlorothiazide (HYDRODIURIL) 12.5 MG tablet Take 2 tablets (25 mg total) by mouth daily. Patient not taking: Reported on 10/24/2023 10/20/23   Rondel Baton, MD  lisinopril-hydrochlorothiazide (PRINZIDE,ZESTORETIC) 20-25 MG per tablet Take 1 tablet by mouth daily.    [provider]  metroNIDAZOLE (FLAGYL) 500 MG tablet Take 1 tablet (500 mg total) by mouth 2 (two) times daily. 10/20/23   Rondel Baton, MD  omeprazole (PRILOSEC) 40 MG capsule Take 40 mg by mouth daily.    [provider]  pravastatin (PRAVACHOL) 40 MG tablet Take 40 mg by mouth daily.    [provider]  prednisoLONE acetate (PRED FORTE) 1 % ophthalmic suspension Place into the right eye. 09/06/23   [provider]      Allergies    Penicillins and Penicillins    Review of Systems   Review of Systems  Respiratory:  Positive for cough.   Neurological:  Positive for light-headedness.  All other systems reviewed and are negative.   Physical Exam Updated Vital Signs BP Marland Kitchen)  142/89   Pulse 90   Temp 98.6 F (37 C) (Oral)   Resp 16   Ht 6\' 3"  (1.905 m)   Wt 133.8 kg   SpO2 95%   BMI 36.87 kg/m  Physical Exam Vitals and nursing note reviewed.  Constitutional:      Appearance: Normal appearance.  HENT:     Head: Normocephalic and atraumatic.  Eyes:     Conjunctiva/sclera: Conjunctivae normal.  Cardiovascular:     Rate and Rhythm: Normal rate and regular rhythm.  Pulmonary:     Effort: Pulmonary effort is normal. No respiratory distress.     Breath sounds: Normal breath sounds.  Abdominal:     General: There is no  distension.     Palpations: Abdomen is soft.     Tenderness: There is no abdominal tenderness.  Musculoskeletal:     Right lower leg: No edema.     Left lower leg: No edema.  Skin:    General: Skin is warm and dry.  Neurological:     General: No focal deficit present.     Mental Status: He is alert.     ED Results / Procedures / Treatments   Labs (all labs ordered are listed, but only abnormal results are displayed) Labs Reviewed  CBC - Abnormal; Notable for the following components:      Result Value   RBC 3.89 (*)    Hemoglobin 12.7 (*)    MCV 100.5 (*)    All other components within normal limits  BASIC METABOLIC PANEL - Abnormal; Notable for the following components:   Glucose, Bld 131 (*)    All other components within normal limits    EKG None  Radiology DG Chest 2 View Result Date: 11/13/2023 CLINICAL DATA:  Cough, shortness of breath EXAM: CHEST - 2 VIEW COMPARISON:  08/11/2023 FINDINGS: The heart size and mediastinal contours are within normal limits. Linear atelectasis at the left lung base. Lungs are otherwise clear. No pleural effusion or pneumothorax. The visualized skeletal structures are unremarkable. IMPRESSION: Linear atelectasis at the left lung base. Otherwise, no acute cardiopulmonary findings. Electronically Signed   By: Duanne Guess D.O.   On: 11/13/2023 17:38    Procedures Procedures    Medications Ordered in ED Medications  albuterol (VENTOLIN HFA) 108 (90 Base) MCG/ACT inhaler 2 puff (2 puffs Inhalation Given 11/13/23 1821)    ED Course/ Medical Decision Making/ A&P                                 Medical Decision Making Amount and/or Complexity of Data Reviewed Labs: ordered. Radiology: ordered.  Risk Prescription drug management.  This patient is a 53 y.o. male  who presents to the ED for concern of cough, fatigue, lightheadedness.   Differential diagnoses prior to evaluation: The emergent differential diagnosis includes,  but is not limited to,  Upper respiratory infection, lower respiratory infection, allergies, asthma, irritants, foreign body, medications (ACE inhibitors), reflux, CHF, lung cancer, interstitial lung disease, psychiatric causes, postnasal drip. This is not an exhaustive differential.   Past Medical History / Co-morbidities / Social History: Asthma, HTN, tobacco use, HLD, GERD, gout  Physical Exam: Physical exam performed. The pertinent findings include: Mildly hypertensive, otherwise normal vital signs.  No acute distress.  Lung sounds clear.  Lab Tests/Imaging studies: I personally interpreted labs/imaging and the pertinent results include: CBC with baseline hemoglobin.  BMP unremarkable.  CXR with left  basilar atelectasis. I agree with the radiologist interpretation.  Cardiac monitoring: EKG obtained and interpreted by myself and attending physician which shows: sinus rhythm   Medications: I ordered medication including albuterol inhaler.  I have reviewed the patients home medicines and have made adjustments as needed.   Disposition: After consideration of the diagnostic results and the patients response to treatment, I feel that emergency department workup does not suggest an emergent condition requiring admission or immediate intervention beyond what has been performed at this time. The plan is: Discharge to home with treatment of subacute cough, likely viral in origin.  Patient not having any chest pain or symptoms concerning for ACS, and EKG without acute ischemic changes.  Will prescribe course of prednisone, azithromycin for possible atypical pneumonia, Tessalon for cough. The patient is safe for discharge and has been instructed to return immediately for worsening symptoms, change in symptoms or any other concerns.  Final Clinical Impression(s) / ED Diagnoses Final diagnoses:  Subacute cough  Dizziness    Rx / DC Orders ED Discharge Orders          Ordered    azithromycin  (ZITHROMAX Z-PAK) 250 MG tablet        11/13/23 1827    predniSONE (DELTASONE) 20 MG tablet  Daily with breakfast        11/13/23 1827    benzonatate (TESSALON) 100 MG capsule  Every 8 hours        11/13/23 1830           Portions of this report may have been transcribed using voice recognition software. Every effort was made to ensure accuracy; however, inadvertent computerized transcription errors may be present.    Jeanella Flattery 11/13/23 1913    Loetta Rough, MD 11/13/23 1931

## 2023-11-13 NOTE — Discharge Instructions (Addendum)
You were seen in the ER for cough and dizziness.  Your blood work and chest x-ray look reassuring. We gave you an inhaler and I have sent cough medicine, steroids, and antibiotics to your pharmacy for possible atypical pneumonia.  Make sure you are staying well hydrated. Continue to monitor how you're doing and return to the ER for new or worsening symptoms.

## 2023-11-24 ENCOUNTER — Emergency Department (HOSPITAL_COMMUNITY)
Admission: EM | Admit: 2023-11-24 | Discharge: 2023-11-24 | Disposition: A | Discharge status: Home / Self Care | Payer: 59 | Attending: Emergency Medicine | Admitting: Emergency Medicine

## 2023-11-24 ENCOUNTER — Emergency Department (HOSPITAL_COMMUNITY): Payer: 59

## 2023-11-24 ENCOUNTER — Encounter (HOSPITAL_COMMUNITY): Payer: Self-pay | Admitting: Emergency Medicine

## 2023-11-24 ENCOUNTER — Other Ambulatory Visit: Payer: Self-pay

## 2023-11-24 DIAGNOSIS — E876 Hypokalemia: Secondary | ICD-10-CM | POA: Diagnosis not present

## 2023-11-24 DIAGNOSIS — J101 Influenza due to other identified influenza virus with other respiratory manifestations: Secondary | ICD-10-CM | POA: Diagnosis not present

## 2023-11-24 DIAGNOSIS — R531 Weakness: Secondary | ICD-10-CM | POA: Diagnosis not present

## 2023-11-24 DIAGNOSIS — J111 Influenza due to unidentified influenza virus with other respiratory manifestations: Secondary | ICD-10-CM

## 2023-11-24 DIAGNOSIS — R059 Cough, unspecified: Secondary | ICD-10-CM | POA: Diagnosis present

## 2023-11-24 DIAGNOSIS — J069 Acute upper respiratory infection, unspecified: Secondary | ICD-10-CM

## 2023-11-24 DIAGNOSIS — Z1152 Encounter for screening for COVID-19: Secondary | ICD-10-CM | POA: Diagnosis not present

## 2023-11-24 LAB — CBC
HCT: 42.5 % (ref 39.0–52.0)
Hemoglobin: 14.1 g/dL (ref 13.0–17.0)
MCH: 33.3 pg (ref 26.0–34.0)
MCHC: 33.2 g/dL (ref 30.0–36.0)
MCV: 100.2 fL — ABNORMAL HIGH (ref 80.0–100.0)
Platelets: 158 10*3/uL (ref 150–400)
RBC: 4.24 MIL/uL (ref 4.22–5.81)
RDW: 13.4 % (ref 11.5–15.5)
WBC: 7.3 10*3/uL (ref 4.0–10.5)
nRBC: 0 % (ref 0.0–0.2)

## 2023-11-24 LAB — BASIC METABOLIC PANEL
Anion gap: 10 (ref 5–15)
BUN: 10 mg/dL (ref 6–20)
CO2: 26 mmol/L (ref 22–32)
Calcium: 8.8 mg/dL — ABNORMAL LOW (ref 8.9–10.3)
Chloride: 100 mmol/L (ref 98–111)
Creatinine, Ser: 1.2 mg/dL (ref 0.61–1.24)
GFR, Estimated: >60 mL/min (ref >60)
Glucose, Bld: 133 mg/dL — ABNORMAL HIGH (ref 70–99)
Potassium: 3.4 mmol/L — ABNORMAL LOW (ref 3.5–5.1)
Sodium: 136 mmol/L (ref 135–145)

## 2023-11-24 LAB — RESP PANEL BY RT-PCR (RSV, FLU A&B, COVID)  RVPGX2
Influenza A by PCR: POSITIVE — AB
Influenza B by PCR: NEGATIVE
Resp Syncytial Virus by PCR: NEGATIVE
SARS Coronavirus 2 by RT PCR: NEGATIVE

## 2023-11-24 MED ORDER — POTASSIUM CHLORIDE CRYS ER 20 MEQ PO TBCR
40.0000 meq | EXTENDED_RELEASE_TABLET | Freq: Once | ORAL | Status: AC
  Administered 2023-11-24: 40 meq via ORAL
  Filled 2023-11-24: qty 2

## 2023-11-24 MED ORDER — ALBUTEROL SULFATE (2.5 MG/3ML) 0.083% IN NEBU
5.0000 mg | INHALATION_SOLUTION | Freq: Once | RESPIRATORY_TRACT | Status: AC
  Administered 2023-11-24: 5 mg via RESPIRATORY_TRACT
  Filled 2023-11-24: qty 6

## 2023-11-24 MED ORDER — LACTATED RINGERS IV BOLUS
1000.0000 mL | Freq: Once | INTRAVENOUS | Status: AC
  Administered 2023-11-24: 1000 mL via INTRAVENOUS

## 2023-11-24 NOTE — Discharge Instructions (Addendum)
It was our pleasure to provide your ER care today - we hope that you feel better.  Drink plenty of fluids/stay well hydrated. Use albuterol inhaler as need.   You may try myquil, mucinex, robitussin or similar over the counter cold and flu medication for symptom relief.   Follow up with primary care doctor in two weeks if symptoms fail to improve/resolve.  Your potassium level is mildly low - eat plenty of fruits and vegetables, and follow up with primary care doctor.   Return to ER if worse, new symptoms, increased trouble breathing, chest pain, persistent vomiting, fainting, or other emergency concern.

## 2023-11-24 NOTE — ED Notes (Signed)
Rt called for breathing tx

## 2023-11-24 NOTE — ED Triage Notes (Signed)
Here last week for congestion. States is no better and now had gen weakness and diarrhea x 2 days with 5 episodes in last 24 hours. Pt states coughing so hard he gets dizzy. Pt states he feels dehydrated.finished abx one week ago. A/o. Audible wheezing noted. Shob with activity. Color wnl. Denies vomiting

## 2023-11-24 NOTE — ED Provider Notes (Signed)
[place] EMERGENCY DEPARTMENT AT [facility] Provider Note   CSN: [id number] Arrival date & time: 11/24/23  1000     History  Chief Complaint  Patient presents with  . Weakness  . Shortness of Breath  . Diarrhea    Nate Berrocal. is a 52 y.o. male.  Pt with prod cough, nasal/sinus congestion, in past week. No sore throat or trouble swallowing. Feels wheezy/sob at times. Hx intermittent wheezing/inhaler use in past. Non smoker. Denies chest pain. No vomiting. Did have a few loose stools. No abd pain. No dysuria or gu c/o. No extremity pain or swelling. No severe headache. No neck pain or stiffness. No specific known ill contacts or known covid/flu exposure. No fever.   The history is provided by the patient and medical records.  Weakness Associated symptoms: cough, diarrhea and shortness of breath   Associated symptoms: no abdominal pain, no chest pain, no dysuria, no fever and no vomiting   Shortness of Breath Associated symptoms: cough and wheezing   Associated symptoms: no abdominal pain, no chest pain, no fever, no neck pain, no rash, no sore throat and no vomiting   Diarrhea Associated symptoms: no abdominal pain, no fever and no vomiting        Home Medications Prior to Admission medications   Medication Sig Start Date End Date Taking? Authorizing Provider  albuterol (PROVENTIL HFA;VENTOLIN HFA) 108 (90 BASE) MCG/ACT inhaler Inhale 2 puffs into the lungs every 6 (six) hours as needed for wheezing or shortness of breath. Patient not taking: Reported on 10/24/2023    [provider]  albuterol (PROVENTIL) (2.5 MG/3ML) 0.083% nebulizer solution Take 3 mLs (2.5 mg total) by nebulization every 6 (six) hours as needed for wheezing or shortness of breath. 09/23/14   Ward, Layla Maw, DO  ALPRAZolam Prudy Feeler) 1 MG tablet Take 1 mg by mouth 4 (four) times daily as needed for anxiety.  Patient not taking: Reported on 10/24/2023    [provider]   amLODipine (NORVASC) 10 MG tablet Take 10 mg by mouth daily.    [provider]  atropine 1 % ophthalmic solution Apply to eye. 09/06/23   [provider]  azithromycin (ZITHROMAX Z-PAK) 250 MG tablet Take 2 tablets (500 mg) on day 1, after that take 1 tablet (250 mg) daily until complete. 11/13/23   Roemhildt, Lorin T, PA-C  benzonatate (TESSALON) 100 MG capsule Take 1 capsule (100 mg total) by mouth every 8 (eight) hours. 11/13/23   Roemhildt, Lorin T, PA-C  cephALEXin (KEFLEX) 500 MG capsule Take 500 mg by mouth 4 (four) times daily.    [provider]  Cholecalciferol 1.25 MG (50000 UT) TABS Take by mouth.    [provider]  ciprofloxacin (CIPRO) 500 MG tablet Take 1 tablet (500 mg total) by mouth every 12 (twelve) hours. 10/20/23   Rondel Baton, MD  hydrochlorothiazide (HYDRODIURIL) 12.5 MG tablet Take 2 tablets (25 mg total) by mouth daily. Patient not taking: Reported on 10/24/2023 10/20/23   Rondel Baton, MD  lisinopril-hydrochlorothiazide (PRINZIDE,ZESTORETIC) 20-25 MG per tablet Take 1 tablet by mouth daily.    [provider]  metroNIDAZOLE (FLAGYL) 500 MG tablet Take 1 tablet (500 mg total) by mouth 2 (two) times daily. 10/20/23   Rondel Baton, MD  omeprazole (PRILOSEC) 40 MG capsule Take 40 mg by mouth daily.    [provider]  pravastatin (PRAVACHOL) 40 MG tablet Take 40 mg by mouth daily.  [provider]  prednisoLONE acetate (PRED FORTE) 1 % ophthalmic suspension Place into the right eye. 09/06/23   [provider]      Allergies    Penicillins and Penicillins    Review of Systems   Review of Systems  Constitutional:  Negative for fever.  HENT:  Positive for congestion and rhinorrhea. Negative for sore throat.   Eyes:  Negative for pain, discharge, redness and visual disturbance.  Respiratory:  Positive for cough, shortness of breath and wheezing.   Cardiovascular:  Negative for  chest pain, palpitations and leg swelling.  Gastrointestinal:  Positive for diarrhea. Negative for abdominal pain and vomiting.  Genitourinary:  Negative for dysuria and flank pain.  Musculoskeletal:  Negative for back pain and neck pain.  Skin:  Negative for rash.  Neurological:  Positive for weakness. Negative for speech difficulty and numbness.  Psychiatric/Behavioral:  Negative for confusion.     Physical Exam Updated Vital Signs BP 137/75   Pulse 99   Temp 98.6 F (37 C) (Oral)   Resp (!) 22   SpO2 95%  Physical Exam Vitals and nursing note reviewed.  Constitutional:      Appearance: Normal appearance. He is well-developed.  HENT:     Head: Atraumatic.     Right Ear: Tympanic membrane normal.     Left Ear: Tympanic membrane normal.     Nose: Nose normal.     Mouth/Throat:     Mouth: Mucous membranes are moist.     Pharynx: Oropharynx is clear.  Eyes:     General: No scleral icterus.    Conjunctiva/sclera: Conjunctivae normal.     Pupils: Pupils are equal, round, and reactive to light.  Neck:     Trachea: No tracheal deviation.     Comments: No stiffness or rigidity.  Cardiovascular:     Rate and Rhythm: Normal rate and regular rhythm.     Pulses: Normal pulses.     Heart sounds: Normal heart sounds. No murmur heard.    No friction rub. No gallop.  Pulmonary:     Effort: Pulmonary effort is normal. No accessory muscle usage or respiratory distress.     Breath sounds: Normal breath sounds.     Comments: Mild exp wheeze.  Abdominal:     General: Bowel sounds are normal. There is no distension.     Palpations: Abdomen is soft.     Tenderness: There is no abdominal tenderness. There is no guarding.  Genitourinary:    Comments: No cva tenderness. Musculoskeletal:        General: No swelling or tenderness.     Cervical back: Normal range of motion and neck supple. No rigidity or tenderness.     Right lower leg: No edema.     Left lower leg: No edema.   Lymphadenopathy:     Cervical: No cervical adenopathy.  Skin:    General: Skin is warm and dry.     Findings: No rash.  Neurological:     Mental Status: He is alert.     Comments: Alert, speech clear. Motor/sens grossly intact bil.   Psychiatric:        Mood and Affect: Mood normal.    ED Results / Procedures / Treatments   Labs (all labs ordered are listed, but only abnormal results are displayed) Results for orders placed or performed during the hospital encounter of 11/24/23  Resp panel by RT-PCR (RSV, Flu A&B, Covid) Anterior Nasal Swab   Collection Time: 11/24/23  10:54 AM   Specimen: Anterior Nasal Swab  Result Value Ref Range   SARS Coronavirus 2 by RT PCR NEGATIVE NEGATIVE   Influenza A by PCR POSITIVE (A) NEGATIVE   Influenza B by PCR NEGATIVE NEGATIVE   Resp Syncytial Virus by PCR NEGATIVE NEGATIVE  Basic metabolic panel   Collection Time: 11/24/23 11:06 AM  Result Value Ref Range   Sodium 136 135 - 145 mmol/L   Potassium 3.4 (L) 3.5 - 5.1 mmol/L   Chloride 100 98 - 111 mmol/L   CO2 26 22 - 32 mmol/L   Glucose, Bld 133 (H) 70 - 99 mg/dL   BUN 10 6 - 20 mg/dL   Creatinine, Ser 7.25 0.61 - 1.24 mg/dL   Calcium 8.8 (L) 8.9 - 10.3 mg/dL   GFR, Estimated >36 >64 mL/min   Anion gap 10 5 - 15  CBC   Collection Time: 11/24/23 11:06 AM  Result Value Ref Range   WBC 7.3 4.0 - 10.5 K/uL   RBC 4.24 4.22 - 5.81 MIL/uL   Hemoglobin 14.1 13.0 - 17.0 g/dL   HCT 40.3 47.4 - 25.9 %   MCV 100.2 (H) 80.0 - 100.0 fL   MCH 33.3 26.0 - 34.0 pg   MCHC 33.2 30.0 - 36.0 g/dL   RDW 56.3 87.5 - 64.3 %   Platelets 158 150 - 400 K/uL   nRBC 0.0 0.0 - 0.2 %   DG Chest Port 1 View Result Date: 11/24/2023 CLINICAL DATA:  Cough. EXAM: PORTABLE CHEST 1 VIEW COMPARISON:  November 13, 2023. FINDINGS: The heart size and mediastinal contours are within normal limits. Both lungs are clear. The visualized skeletal structures are unremarkable. IMPRESSION: No active disease. Electronically  Signed   By: Lupita Raider M.D.   On: 11/24/2023 11:15   DG Chest 2 View Result Date: 11/13/2023 CLINICAL DATA:  Cough, shortness of breath EXAM: CHEST - 2 VIEW COMPARISON:  08/11/2023 FINDINGS: The heart size and mediastinal contours are within normal limits. Linear atelectasis at the left lung base. Lungs are otherwise clear. No pleural effusion or pneumothorax. The visualized skeletal structures are unremarkable. IMPRESSION: Linear atelectasis at the left lung base. Otherwise, no acute cardiopulmonary findings. Electronically Signed   By: Duanne Guess D.O.   On: 11/13/2023 17:38     EKG None  Radiology DG Chest [facility] 1 View Result Date: 11/24/2023 CLINICAL DATA:  Cough. EXAM: PORTABLE CHEST 1 VIEW COMPARISON:  November 13, 2023. FINDINGS: The heart size and mediastinal contours are within normal limits. Both lungs are clear. The visualized skeletal structures are unremarkable. IMPRESSION: No active disease. Electronically Signed   By: Lupita Raider M.D.   On: 11/24/2023 11:15    Procedures Procedures    Medications Ordered in ED Medications  albuterol (PROVENTIL) (2.5 MG/3ML) 0.083% nebulizer solution 5 mg (5 mg Nebulization Given 11/24/23 1128)  potassium chloride SA (KLOR-CON M) CR tablet 40 mEq (40 mEq Oral Given 11/24/23 1211)  lactated ringers bolus 1,000 mL (1,000 mLs Intravenous New Bag/Given 11/24/23 1223)    ED Course/ Medical Decision Making/ A&P                                 Medical Decision Making Problems Addressed: Generalized weakness: acute illness or injury with systemic symptoms that poses a threat to life or bodily functions Hypokalemia: acute illness or injury Influenza: acute illness or injury with systemic symptoms that poses  a threat to life or bodily functions Viral URI with cough: acute illness or injury with systemic symptoms  Amount and/or Complexity of Data Reviewed External Data Reviewed: notes. Labs: ordered. Decision-making details  documented in ED Course. Radiology: ordered and independent interpretation performed. Decision-making details documented in ED Course.  Risk Prescription drug management. Decision regarding hospitalization.   Iv ns. Continuous pulse ox and cardiac monitoring. Labs ordered/sent. Imaging ordered.   Differential diagnosis includes pneumonia, covid, flu, etc . Dispo decision including potential need for admission considered - will get labs and imaging and reassess.   Reviewed nursing notes and prior charts for additional history. External reports reviewed.   Cardiac monitor: sinus rhythm, rate 90.  Albuterol neb tx.   Pt requests ivf, indicates he feels dehydrated, noting recent poor po intake.   LR bolus.   Po fluids/food.   Labs reviewed/interpreted by me - wbc and hgb normal.  K sl low. Kcl po. Flu is positive. Discussed w pt.   Xrays reviewed/interpreted by me - no pna.   Recheck, pt breathing comfortably. O2 sats 97%. No wheezing or increased wob. No emesis. Pt currently appears stable for d/c.   Rec close pcp f/u.  Return precautions provided.           Final Clinical Impression(s) / ED Diagnoses Final diagnoses:  Influenza  Generalized weakness  Hypokalemia  Viral URI with cough    Rx / DC Orders ED Discharge Orders     None         Cathren Laine, MD 11/24/23 1319

## 2023-12-02 ENCOUNTER — Other Ambulatory Visit (HOSPITAL_COMMUNITY)
Admission: RE | Admit: 2023-12-02 | Discharge: 2023-12-02 | Disposition: A | Discharge status: Home / Self Care | Payer: 59 | Source: Ambulatory Visit | Attending: Internal Medicine | Admitting: Internal Medicine

## 2023-12-02 DIAGNOSIS — Z1211 Encounter for screening for malignant neoplasm of colon: Secondary | ICD-10-CM | POA: Insufficient documentation

## 2023-12-02 LAB — BASIC METABOLIC PANEL
Anion gap: 11 (ref 5–15)
BUN: 18 mg/dL (ref 6–20)
CO2: 26 mmol/L (ref 22–32)
Calcium: 9.4 mg/dL (ref 8.9–10.3)
Chloride: 100 mmol/L (ref 98–111)
Creatinine, Ser: 0.96 mg/dL (ref 0.61–1.24)
GFR, Estimated: >60 mL/min (ref >60)
Glucose, Bld: 124 mg/dL — ABNORMAL HIGH (ref 70–99)
Potassium: 3.3 mmol/L — ABNORMAL LOW (ref 3.5–5.1)
Sodium: 137 mmol/L (ref 135–145)

## 2023-12-12 ENCOUNTER — Ambulatory Visit (HOSPITAL_COMMUNITY): Payer: Self-pay | Admitting: Anesthesiology

## 2023-12-12 ENCOUNTER — Other Ambulatory Visit: Payer: Self-pay

## 2023-12-12 ENCOUNTER — Encounter (HOSPITAL_COMMUNITY): Payer: Self-pay | Admitting: Internal Medicine

## 2023-12-12 ENCOUNTER — Ambulatory Visit (HOSPITAL_COMMUNITY)
Admission: RE | Admit: 2023-12-12 | Discharge: 2023-12-12 | Disposition: A | Discharge status: Home / Self Care | Payer: 59 | Attending: Internal Medicine | Admitting: Internal Medicine

## 2023-12-12 ENCOUNTER — Encounter (HOSPITAL_COMMUNITY)
Admission: RE | Disposition: A | Discharge status: Home / Self Care | Payer: Self-pay | Source: Home / Self Care | Attending: Internal Medicine

## 2023-12-12 ENCOUNTER — Ambulatory Visit (HOSPITAL_BASED_OUTPATIENT_CLINIC_OR_DEPARTMENT_OTHER): Payer: 59 | Admitting: Anesthesiology

## 2023-12-12 DIAGNOSIS — I10 Essential (primary) hypertension: Secondary | ICD-10-CM | POA: Insufficient documentation

## 2023-12-12 DIAGNOSIS — K5730 Diverticulosis of large intestine without perforation or abscess without bleeding: Secondary | ICD-10-CM | POA: Diagnosis not present

## 2023-12-12 DIAGNOSIS — R948 Abnormal results of function studies of other organs and systems: Secondary | ICD-10-CM | POA: Diagnosis present

## 2023-12-12 DIAGNOSIS — J45909 Unspecified asthma, uncomplicated: Secondary | ICD-10-CM | POA: Insufficient documentation

## 2023-12-12 DIAGNOSIS — K219 Gastro-esophageal reflux disease without esophagitis: Secondary | ICD-10-CM | POA: Diagnosis not present

## 2023-12-12 DIAGNOSIS — F17210 Nicotine dependence, cigarettes, uncomplicated: Secondary | ICD-10-CM | POA: Insufficient documentation

## 2023-12-12 HISTORY — PX: COLONOSCOPY WITH PROPOFOL: SHX5780

## 2023-12-12 SURGERY — COLONOSCOPY WITH PROPOFOL
Anesthesia: General

## 2023-12-12 MED ORDER — PROPOFOL 10 MG/ML IV BOLUS
INTRAVENOUS | Status: DC | PRN
  Administered 2023-12-12: 40 mg via INTRAVENOUS
  Administered 2023-12-12: 50 mg via INTRAVENOUS
  Administered 2023-12-12: 60 mg via INTRAVENOUS
  Administered 2023-12-12: 50 mg via INTRAVENOUS

## 2023-12-12 MED ORDER — LIDOCAINE HCL (CARDIAC) PF 100 MG/5ML IV SOSY
PREFILLED_SYRINGE | INTRAVENOUS | Status: DC | PRN
  Administered 2023-12-12: 50 mg via INTRATRACHEAL

## 2023-12-12 MED ORDER — PROPOFOL 500 MG/50ML IV EMUL
INTRAVENOUS | Status: DC | PRN
  Administered 2023-12-12: 150 ug/kg/min via INTRAVENOUS

## 2023-12-12 MED ORDER — LACTATED RINGERS IV SOLN: INTRAVENOUS | Status: DC | PRN

## 2023-12-12 MED ORDER — STERILE WATER FOR IRRIGATION IR SOLN
Status: DC | PRN
  Administered 2023-12-12: 120 mL

## 2023-12-12 NOTE — Anesthesia Preprocedure Evaluation (Signed)
 Anesthesia Evaluation  Patient identified by MRN, date of birth, ID band Patient awake    Reviewed: Allergy & Precautions, H&P , NPO status , Patient's Chart, lab work & pertinent test results, reviewed documented beta blocker date and time   Airway Mallampati: II  TM Distance: >3 FB Neck ROM: full    Dental no notable dental hx. (+) Dental Advisory Given, Teeth Intact   Pulmonary asthma , Current Smoker   Pulmonary exam normal breath sounds clear to auscultation       Cardiovascular Exercise Tolerance: Good hypertension, negative cardio ROS Normal cardiovascular exam Rhythm:regular Rate:Normal     Neuro/Psych negative neurological ROS  negative psych ROS   GI/Hepatic negative GI ROS, Neg liver ROS,GERD  ,,  Endo/Other  negative endocrine ROS    Renal/GU negative Renal ROS  negative genitourinary   Musculoskeletal   Abdominal   Peds  Hematology   Anesthesia Other Findings   Reproductive/Obstetrics negative OB ROS                             Anesthesia Physical Anesthesia Plan  ASA: 2  Anesthesia Plan: General   Post-op Pain Management: Minimal or no pain anticipated   Induction: Intravenous  PONV Risk Score and Plan:   Airway Management Planned: Nasal Cannula and Natural Airway  Additional Equipment: None  Intra-op Plan:   Post-operative Plan:   Informed Consent: I have reviewed the patients History and Physical, chart, labs and discussed the procedure including the risks, benefits and alternatives for the proposed anesthesia with the patient or authorized representative who has indicated his/her understanding and acceptance.     Dental Advisory Given  Plan Discussed with: CRNA  Anesthesia Plan Comments:        Anesthesia Quick Evaluation

## 2023-12-12 NOTE — Discharge Instructions (Signed)
  Colonoscopy Discharge Instructions  Read the instructions outlined below and refer to this sheet in the next few weeks. These discharge instructions provide you with general information on caring for yourself after you leave the hospital. Your doctor may also give you specific instructions. While your treatment has been planned according to the most current medical practices available, unavoidable complications occasionally occur. If you have any problems or questions after discharge, call Dr. Shaaron at [phone]. ACTIVITY You may resume your regular activity, but move at a slower pace for the next 24 hours.  Take frequent rest periods for the next 24 hours.  Walking will help get rid of the air and reduce the bloated feeling in your belly (abdomen).  No driving for 24 hours (because of the medicine (anesthesia) used during the test).   Do not sign any important legal documents or operate any machinery for 24 hours (because of the anesthesia used during the test).  NUTRITION Drink plenty of fluids.  You may resume your normal diet as instructed by your doctor.  Begin with a light meal and progress to your normal diet. Heavy or fried foods are harder to digest and may make you feel sick to your stomach (nauseated).  Avoid alcoholic beverages for 24 hours or as instructed.  MEDICATIONS You may resume your normal medications unless your doctor tells you otherwise.  WHAT YOU CAN EXPECT TODAY Some feelings of bloating in the abdomen.  Passage of more gas than usual.  Spotting of blood in your stool or on the toilet paper.  IF YOU HAD POLYPS REMOVED DURING THE COLONOSCOPY: No aspirin products for 7 days or as instructed.  No alcohol for 7 days or as instructed.  Eat a soft diet for the next 24 hours.  FINDING OUT THE RESULTS OF YOUR TEST Not all test results are available during your visit. If your test results are not back during the visit, make an appointment with your caregiver to find out the  results. Do not assume everything is normal if you have not heard from your caregiver or the medical facility. It is important for you to follow up on all of your test results.  SEEK IMMEDIATE MEDICAL ATTENTION IF: You have more than a spotting of blood in your stool.  Your belly is swollen (abdominal distention).  You are nauseated or vomiting.  You have a temperature over 101.  You have abdominal pain or discomfort that is severe or gets worse throughout the day.    You have diverticulosis throughout your colon.  No colitis.  No tumor.  You recently had an infection which resolved  It is recommended you have repeat colonoscopy in 10 years  For your complaint of bleeding coming from your penis, you should see your PCP and/or a urologist.  At patient request, I called Winton Sheller at [phone] -reviewed findings and recommendations

## 2023-12-12 NOTE — Transfer of Care (Signed)
 Immediate Anesthesia Transfer of Care Note  Patient: Ethan [name].  Procedure(s) Performed: COLONOSCOPY WITH PROPOFOL   Patient Location: Endoscopy Unit  Anesthesia Type:General  Level of Consciousness: awake  Airway & Oxygen  Therapy: Patient Spontanous Breathing  Post-op Assessment: Report given to RN  Post vital signs: Reviewed and stable  Last Vitals:  Vitals Value Taken Time  BP    Temp    Pulse    Resp    SpO2      Last Pain:  Vitals:   12/12/23 0727  TempSrc:   PainSc: 0-No pain      Patients Stated Pain Goal: 8 (12/12/23 9356)  Complications: No notable events documented.

## 2023-12-12 NOTE — H&P (Signed)
 @LOGO @   Primary Care Physician:  [facility] Primary Gastroenterologist:  Dr. Shaaron  Pre-Procedure History & Physical: HPI:  Ethan [name]. is a 53 y.o. male here for diagnostic colonoscopy as recent CT found diffuse colonic wall thickening.  Improved with antibiotics.  Past Medical History:  Diagnosis Date   Asthma    Cellulitis    hospitalized 07/2013   Diverticulitis    06/2012   Diverticulitis    Gout    Hypercholesterolemia    Hypertension    Hypokalemia    Pneumonia     Past Surgical History:  Procedure Laterality Date   ARM WOUND REPAIR / CLOSURE     ESOPHAGOGASTRODUODENOSCOPY N/A 11/14/2013   MFM:Ypjujo hernia; otherwise negative examination   WRIST ARTHROCENTESIS     WRIST SURGERY  12/2013   right    Prior to Admission medications   Medication Sig Start Date End Date Taking? Authorizing Provider  albuterol  (PROVENTIL  HFA;VENTOLIN  HFA) 108 (90 BASE) MCG/ACT inhaler Inhale 2 puffs into the lungs every 6 (six) hours as needed for wheezing or shortness of breath.   Yes [provider]  albuterol  (PROVENTIL ) (2.5 MG/3ML) 0.083% nebulizer solution Take 3 mLs (2.5 mg total) by nebulization every 6 (six) hours as needed for wheezing or shortness of breath. 09/23/14  Yes Ward, Kristen N, DO  amLODipine  (NORVASC ) 10 MG tablet Take 10 mg by mouth daily.   Yes [provider]  Cholecalciferol 1.25 MG (50000 UT) TABS Take by mouth.   Yes [provider]  hydrochlorothiazide  (HYDRODIURIL ) 12.5 MG tablet Take 2 tablets (25 mg total) by mouth daily. 10/20/23  Yes Yolande Lamar BROCKS, MD  lisinopril  (ZESTRIL ) 20 MG tablet Take 20 mg by mouth daily.   Yes [provider]  omeprazole (PRILOSEC) 40 MG capsule Take 40 mg by mouth daily.   Yes [provider]  pravastatin  (PRAVACHOL ) 40 MG tablet Take 40 mg by mouth daily.   Yes [provider]  ALPRAZolam  (XANAX ) 1 MG tablet Take 1 mg by mouth 4 (four)  times daily as needed for anxiety.  Patient not taking: Reported on 10/24/2023    [provider]  atropine 1 % ophthalmic solution Apply to eye. 09/06/23   [provider]  azithromycin  (ZITHROMAX  Z-PAK) 250 MG tablet Take 2 tablets (500 mg) on day 1, after that take 1 tablet (250 mg) daily until complete. 11/13/23   Roemhildt, Lorin T, PA-C  benzonatate  (TESSALON ) 100 MG capsule Take 1 capsule (100 mg total) by mouth every 8 (eight) hours. 11/13/23   Roemhildt, Lorin T, PA-C  cephALEXin  (KEFLEX ) 500 MG capsule Take 500 mg by mouth 4 (four) times daily.    [provider]  ciprofloxacin  (CIPRO ) 500 MG tablet Take 1 tablet (500 mg total) by mouth every 12 (twelve) hours. 10/20/23   Yolande Lamar BROCKS, MD  lisinopril -hydrochlorothiazide  (PRINZIDE ,ZESTORETIC ) 20-25 MG per tablet Take 1 tablet by mouth daily.    [provider]  metroNIDAZOLE  (FLAGYL ) 500 MG tablet Take 1 tablet (500 mg total) by mouth 2 (two) times daily. 10/20/23   Yolande Lamar BROCKS, MD  prednisoLONE acetate (PRED FORTE) 1 % ophthalmic suspension Place into the right eye. 09/06/23   [provider]    Allergies as of 11/07/2023 - Review Complete 10/24/2023  Allergen Reaction Noted   Penicillins Other (See Comments) 07/14/2011   Penicillins Rash 10/20/2023    Family History  Problem Relation Age of Onset   Colon cancer Neg Hx  Social History   Socioeconomic History   Marital status: Single    Spouse name: Not on file   Number of children: 3   Years of education: Not on file   Highest education level: Not on file  Occupational History   Not on file  Tobacco Use   Smoking status: Every Day    Current packs/day: 0.50    Average packs/day: 0.5 packs/day for 35.0 years (17.5 ttl pk-yrs)    Types: Cigarettes    Start date: 23   Smokeless tobacco: Never   Tobacco comments:    1/2 pack daily  Vaping Use   Vaping status: Never Used  Substance and Sexual Activity    Alcohol use: Yes    Alcohol/week: 1.0 standard drink of alcohol    Types: 1 Cans of beer per week    Comment: heavy use on weekends   Drug use: Never   Sexual activity: Not Currently    Birth control/protection: None  Other Topics Concern   Not on file  Social History Narrative   ** Merged History Encounter **       Social Drivers of Corporate Investment Banker Strain: Not on file  Food Insecurity: Not on file  Transportation Needs: Not on file  Physical Activity: Not on file  Stress: Not on file  Social Connections: Not on file  Intimate Partner Violence: Not on file    Review of Systems: See HPI, otherwise negative ROS  Physical Exam: BP (!) 145/98   Pulse 97   Temp 98.2 F (36.8 C) (Oral)   Resp (!) 24   Ht 6' 3 (1.905 m)   Wt 134.7 kg   SpO2 92%   BMI 37.12 kg/m  General:   Alert,  Well-developed, well-nourished, pleasant and cooperative in NAD  Neck:  Supple; no masses or thyromegaly. No significant cervical adenopathy. Lungs:  Clear throughout to auscultation.   No wheezes, crackles, or rhonchi. No acute distress. Heart:  Regular rate and rhythm; no murmurs, clicks, rubs,  or gallops. Abdomen: Non-distended, normal bowel sounds.  Soft and nontender without appreciable mass or hepatosplenomegaly.  Pulses:  Normal pulses noted. Extremities:  Without clubbing or edema.  Impression/Plan: 53 year old gentleman recent CT findings consistent with a pancolitis now improved after course of antibiotics.  No prior colonoscopy.  Diagnostic colonoscopy today per plan. The risks, benefits, limitations, alternatives and imponderables have been reviewed with the patient. Questions have been answered. All parties are agreeable.    Patient complains to me of blood coming out of his penis after sex.  Explained to him that this was a GI evaluation today.  And he should follow-up with his PCP regarding that complaint.     Notice: This dictation was prepared with Dragon  dictation along with smaller phrase technology. Any transcriptional errors that result from this process are unintentional and may not be corrected upon review.

## 2023-12-12 NOTE — Anesthesia Postprocedure Evaluation (Signed)
 Anesthesia Post Note  Patient: Ethan [name].  Procedure(s) Performed: COLONOSCOPY WITH PROPOFOL   Patient location during evaluation: Endoscopy Anesthesia Type: General Level of consciousness: awake and alert Pain management: pain level controlled Vital Signs Assessment: post-procedure vital signs reviewed and stable Respiratory status: spontaneous breathing Cardiovascular status: blood pressure returned to baseline Postop Assessment: no apparent nausea or vomiting Anesthetic complications: no   No notable events documented.   Last Vitals:  Vitals:   12/12/23 0643  BP: (!) 145/98  Pulse: 97  Resp: (!) 24  Temp: 36.8 C  SpO2: 92%    Last Pain:  Vitals:   12/12/23 0727  TempSrc:   PainSc: 0-No pain                 Ezra Denne

## 2023-12-12 NOTE — Anesthesia Postprocedure Evaluation (Signed)
 Anesthesia Post Note  Patient: Ethan [name].  Procedure(s) Performed: COLONOSCOPY WITH PROPOFOL   Patient location during evaluation: PACU Anesthesia Type: General Level of consciousness: awake and alert Pain management: pain level controlled Vital Signs Assessment: post-procedure vital signs reviewed and stable Respiratory status: spontaneous breathing, nonlabored ventilation, respiratory function stable and patient connected to nasal cannula oxygen  Cardiovascular status: blood pressure returned to baseline and stable Postop Assessment: no apparent nausea or vomiting Anesthetic complications: no   There were no known notable events for this encounter.   Last Vitals:  Vitals:   12/12/23 0643 12/12/23 0749  BP: (!) 145/98 116/82  Pulse: 97 (!) 103  Resp: (!) 24 (!) 22  Temp: 36.8 C 36.8 C  SpO2: 92% 97%    Last Pain:  Vitals:   12/12/23 0749  TempSrc: Oral  PainSc: 0-No pain                 Nainika Newlun L Jeston Junkins

## 2023-12-12 NOTE — Op Note (Signed)
 [facility] Patient Name: Ethan [name] Procedure Date: 12/12/2023 7:10 AM MRN: [id number] Date of Birth: Apr 17, 1971 Attending MD: Lamar Ozell Hollingshead , MD, [id number] CSN: [id number] Age: 53 Admit Type: Outpatient Procedure:                Colonoscopy Indications:              Abnormal CT of the GI tract Providers:                Lamar Ozell Hollingshead, MD, Devere Lodge, Nidia Oak Referring MD:              Medicines:                Propofol  per Anesthesia Complications:            No immediate complications. Estimated Blood Loss:     Estimated blood loss: none. Procedure:                Pre-Anesthesia Assessment:                           - Prior to the procedure, a History and Physical                            was performed, and patient medications and                            allergies were reviewed. The patient's tolerance of                            previous anesthesia was also reviewed. The risks                            and benefits of the procedure and the sedation                            options and risks were discussed with the patient.                            All questions were answered, and informed consent                            was obtained. Prior Anticoagulants: The patient has                            taken no anticoagulant or antiplatelet agents. ASA                            Grade Assessment: II - A patient with mild systemic                            disease. After reviewing the risks and benefits,  the patient was deemed in satisfactory condition to                            undergo the procedure.                           After obtaining informed consent, the colonoscope                            was passed under direct vision. Throughout the                            procedure, the patient's blood pressure, pulse, and                            oxygen  saturations were  monitored continuously. The                            [id number]) scope was introduced through                            the anus and advanced to the the cecum, identified                            by appendiceal orifice and ileocecal valve. The                            colonoscopy was performed without difficulty. The                            patient tolerated the procedure well. The quality                            of the bowel preparation was adequate. The                            ileocecal valve, appendiceal orifice, and rectum                            were photographed. Scope In: 7:35:56 AM Scope Out: 7:46:10 AM Scope Withdrawal Time: 0 hours 7 minutes 1 second  Total Procedure Duration: 0 hours 10 minutes 14 seconds  Findings:      The perianal and digital rectal examinations were normal.      Scattered medium-mouthed diverticula were found in the entire colon.      The exam was otherwise without abnormality on direct and retroflexion       views. Impression:               - Diverticulosis in the entire examined colon.                           - The examination was otherwise normal on direct                            and retroflexion  views.                           - No specimens collected. I suspect patient                            suffered an infectious colitis which is resolved. Moderate Sedation:      Moderate (conscious) sedation was personally administered by an       anesthesia professional. The following parameters were monitored: oxygen        saturation, heart rate, blood pressure, respiratory rate, EKG, adequacy       of pulmonary ventilation, and response to care. Recommendation:           - Patient has a contact number available for                            emergencies. The signs and symptoms of potential                            delayed complications were discussed with the                            patient. Return to normal activities  tomorrow.                            Written discharge instructions were provided to the                            patient.                           - Advance diet as tolerated.                           - Continue present medications.                           - Repeat colonoscopy in 10 years for screening                            purposes.                           - Return to GI office (date not yet determined).                            Patient related to being in preop he noted blood                            coming out of his penis after sex. I recommended he                            follow-up with his PCP and or go see a urologist. Procedure Code(s):        --- Professional ---  54621, Colonoscopy, flexible; diagnostic, including                            collection of specimen(s) by brushing or washing,                            when performed (separate procedure) Diagnosis Code(s):        --- Professional ---                           K57.30, Diverticulosis of large intestine without                            perforation or abscess without bleeding                           R93.3, Abnormal findings on diagnostic imaging of                            other parts of digestive tract CPT copyright 2022 American Medical Association. All rights reserved. The codes documented in this report are preliminary and upon coder review may  be revised to meet current compliance requirements. [name], MD Lamar Ozell Hollingshead, MD 12/12/2023 7:59:56 AM This report has been signed electronically. Number of Addenda: 0

## 2023-12-13 ENCOUNTER — Encounter (HOSPITAL_COMMUNITY): Payer: Self-pay | Admitting: Internal Medicine

## 2023-12-21 ENCOUNTER — Ambulatory Visit (INDEPENDENT_AMBULATORY_CARE_PROVIDER_SITE_OTHER): Payer: 59 | Admitting: Urology

## 2023-12-21 VITALS — BP 148/93 | HR 99 | Ht 75.0 in | Wt 289.0 lb

## 2023-12-21 DIAGNOSIS — E7800 Pure hypercholesterolemia, unspecified: Secondary | ICD-10-CM | POA: Insufficient documentation

## 2023-12-21 DIAGNOSIS — R3129 Other microscopic hematuria: Secondary | ICD-10-CM | POA: Diagnosis not present

## 2023-12-21 DIAGNOSIS — R350 Frequency of micturition: Secondary | ICD-10-CM

## 2023-12-21 DIAGNOSIS — Z125 Encounter for screening for malignant neoplasm of prostate: Secondary | ICD-10-CM | POA: Diagnosis not present

## 2023-12-21 LAB — MICROSCOPIC EXAMINATION: Epithelial Cells (non renal): >10 /[HPF] — AB (ref 0–10)

## 2023-12-21 LAB — URINALYSIS, COMPLETE
Bilirubin, UA: NEGATIVE
Glucose, UA: NEGATIVE
Ketones, UA: NEGATIVE
Leukocytes,UA: NEGATIVE
Nitrite, UA: NEGATIVE
Protein,UA: NEGATIVE
Specific Gravity, UA: 1.02 (ref 1.005–1.030)
Urobilinogen, Ur: 4 mg/dL — ABNORMAL HIGH (ref 0.2–1.0)
pH, UA: 5.5 (ref 5.0–7.5)

## 2023-12-21 MED ORDER — MELOXICAM 15 MG PO TABS: 15.0000 mg | ORAL_TABLET | Freq: Every day | ORAL | 0 refills | Status: DC

## 2023-12-21 MED ORDER — TAMSULOSIN HCL 0.4 MG PO CAPS: 0.4000 mg | ORAL_CAPSULE | Freq: Every day | ORAL | 11 refills | Status: DC

## 2023-12-21 NOTE — Patient Instructions (Signed)

## 2023-12-21 NOTE — Progress Notes (Signed)
I,Amy L Pierron,acting as a scribe for Vanna Scotland, MD.,have documented all relevant documentation on the behalf of Vanna Scotland, MD,as directed by  Vanna Scotland, MD while in the presence of Vanna Scotland, MD.  12/21/2023 6:25 PM   Ethan [name]. 05-08-1971 [id number]  Referring provider: [facility] [address] [place],  [place] [address]  Chief Complaint  Patient presents with   Establish Care   Hematuria   Urinary Frequency    HPI: 53 year-old male presents today for further evaluation of hematuria.  He was seen and evaluated at the emergency room at [facility] on 12/13/2023 with gross hematuria noted in his urine, particularly after he ejaculated, which lasted about a week. He had no other urinary symptoms. His labs were unremarkable. Urinalysis showed some small microscopic blood, 5 red blood cells per high powered field, otherwise unremarkable. A CT abdomen, pelvis with IV contrast was performed. Only able to review the impression but it shows no GU pathology.  Chlamydia and gonorrhea were also checked and found to be negative.  He also endorsed that he's having some urinary urgency and frequency type symptoms as well as nocturia times four. At some point he was on medications for this.   His urinalysis today shows 10 epithelial cells and 11 to 30 red blood cells per high-powered field, otherwise unremarkable.  He mentions that following intercourse he saw blood. Then when he urinates after intercourse he saw blood as well. Denies dysuria and discharge from his penis.   He has had urgency and frequency for a few months. He mentioned that when he was about 15 he saw a doctor for urinary symptoms and was given medications at that time.   He is a tobacco user. He has not had a prostate cancer screening before.  PMH: Past Medical History:  Diagnosis Date   Asthma    Cellulitis    hospitalized 07/2013   Diverticulitis    06/2012   Diverticulitis     Gout    Hypercholesterolemia    Hypertension    Hypokalemia    Pneumonia     Surgical History: Past Surgical History:  Procedure Laterality Date   ARM WOUND REPAIR / CLOSURE     COLONOSCOPY WITH PROPOFOL N/A 12/12/2023   Procedure: COLONOSCOPY WITH PROPOFOL;  Surgeon: Corbin Ade, MD;  Location: [facility];  Service: Endoscopy;  Laterality: N/A;  7:30 am, asa 2   ESOPHAGOGASTRODUODENOSCOPY N/A 11/14/2013   [id number] hernia; otherwise negative examination   WRIST ARTHROCENTESIS     WRIST SURGERY  12/2013   right    Home Medications:  Allergies as of 12/21/2023       Reactions   Penicillins Other (See Comments)   CHILDHOOD ALLERGY   Penicillins Rash        Medication List        Accurate as of December 21, 2023  6:25 PM. If you have any questions, ask your nurse or doctor.          STOP taking these medications    ALPRAZolam 1 MG tablet Commonly known as: XANAX   atropine 1 % ophthalmic solution   azithromycin 250 MG tablet Commonly known as: Zithromax Z-Pak   benzonatate 100 MG capsule Commonly known as: TESSALON   cephALEXin 500 MG capsule Commonly known as: KEFLEX   ciprofloxacin 500 MG tablet Commonly known as: CIPRO   metroNIDAZOLE 500 MG tablet Commonly known as: FLAGYL   prednisoLONE acetate 1 % ophthalmic  suspension Commonly known as: PRED FORTE       TAKE these medications    albuterol 108 (90 Base) MCG/ACT inhaler Commonly known as: VENTOLIN HFA Inhale 2 puffs into the lungs every 6 (six) hours as needed for wheezing or shortness of breath.   albuterol (2.5 MG/3ML) 0.083% nebulizer solution Commonly known as: PROVENTIL Take 3 mLs (2.5 mg total) by nebulization every 6 (six) hours as needed for wheezing or shortness of breath.   amLODipine 10 MG tablet Commonly known as: NORVASC Take 10 mg by mouth daily.   Cholecalciferol 1.25 MG (50000 UT) Tabs Take by mouth.   hydrochlorothiazide 12.5 MG tablet Commonly known as:  HYDRODIURIL Take 2 tablets (25 mg total) by mouth daily.   lisinopril 20 MG tablet Commonly known as: ZESTRIL Take 20 mg by mouth daily.   meloxicam 15 MG tablet Commonly known as: MOBIC Take 1 tablet (15 mg total) by mouth daily.   omeprazole 40 MG capsule Commonly known as: PRILOSEC Take 40 mg by mouth daily.   pravastatin 40 MG tablet Commonly known as: PRAVACHOL Take 40 mg by mouth daily.   tamsulosin 0.4 MG Caps capsule Commonly known as: FLOMAX Take 1 capsule (0.4 mg total) by mouth daily.        Allergies:  Allergies  Allergen Reactions   Penicillins Other (See Comments)    CHILDHOOD ALLERGY   Penicillins Rash    Family History: Family History  Problem Relation Age of Onset   Colon cancer Neg Hx     Social History:  reports that he has been smoking cigarettes. He started smoking about 35 years ago. He has a 17.5 pack-year smoking history. He has never used smokeless tobacco. He reports current alcohol use of about 1.0 standard drink of alcohol per week. He reports that he does not use drugs.   Physical Exam: BP (!) 148/93   Pulse 99   Ht 6\' 3"  (1.905 m)   Wt 289 lb (131.1 kg)   BMI 36.12 kg/m   Constitutional:  Alert and oriented, No acute distress. HEENT: [place] AT, moist mucus membranes.  Trachea midline, no masses. GU: Minimally enlarged prostate. Neurologic: Grossly intact, no focal deficits, moving all 4 extremities. Psychiatric: Normal mood and affect.   Urinalysis    Component Value Date/Time   COLORURINE YELLOW 11/22/2022 0756   APPEARANCEUR Clear 12/21/2023 1103   LABSPEC 1.020 11/22/2022 0756   PHURINE 5.0 11/22/2022 0756   GLUCOSEU Negative 12/21/2023 1103   HGBUR MODERATE (A) 11/22/2022 0756   BILIRUBINUR Negative 12/21/2023 1103   KETONESUR 5 (A) 11/22/2022 0756   PROTEINUR Negative 12/21/2023 1103   PROTEINUR 30 (A) 11/22/2022 0756   UROBILINOGEN 0.2 11/30/2013 1436   NITRITE Negative 12/21/2023 1103   NITRITE NEGATIVE  11/22/2022 0756   LEUKOCYTESUR Negative 12/21/2023 1103   LEUKOCYTESUR NEGATIVE 11/22/2022 0756    Lab Results  Component Value Date   LABMICR See below: 12/21/2023   WBCUA 0-5 12/21/2023   LABEPIT >10 (A) 12/21/2023   BACTERIA Few 12/21/2023    Assessment & Plan:    1. Gross hematuria  - Will defer additional CT imaging at this time, given he just had a CT scan at [facility] without any pathology. There's no delayed imaging, but the probability of that is fairly low.   - Plan for cystoscopy.   - Risk factors include tobacco use.   2. Prostatitis  - Recent onset urinary frequency and urgency. Prescribed Flomax and Meloxicam 15 mg daily. When  he returns next month he will let us know if any symptom improvement.  - Encouraged discontinuing intercourse if it continues to exacerbate his symptoms.   3. Prostate cancer screening  - DRE today. PSA drawn and awaiting final result.  Return in about 1 month (around 01/21/2024) for cystoscopy.   [facility] [address], [address] [place], [place] [address] [phone]

## 2023-12-22 LAB — PSA: Prostate Specific Ag, Serum: 0.8 ng/mL (ref 0.0–4.0)

## 2024-01-22 ENCOUNTER — Other Ambulatory Visit: Payer: Self-pay | Admitting: Urology

## 2024-01-22 DIAGNOSIS — R350 Frequency of micturition: Secondary | ICD-10-CM

## 2024-01-22 DIAGNOSIS — R3129 Other microscopic hematuria: Secondary | ICD-10-CM

## 2024-01-22 DIAGNOSIS — Z125 Encounter for screening for malignant neoplasm of prostate: Secondary | ICD-10-CM

## 2024-01-31 ENCOUNTER — Other Ambulatory Visit: Payer: Self-pay | Admitting: Urology

## 2024-02-03 ENCOUNTER — Emergency Department (HOSPITAL_COMMUNITY): Payer: Self-pay

## 2024-02-03 ENCOUNTER — Emergency Department (HOSPITAL_COMMUNITY)
Admission: EM | Admit: 2024-02-03 | Discharge: 2024-02-03 | Discharge status: Against Medical Advice | Payer: Self-pay | Attending: Emergency Medicine | Admitting: Emergency Medicine

## 2024-02-03 DIAGNOSIS — Z5321 Procedure and treatment not carried out due to patient leaving prior to being seen by health care provider: Secondary | ICD-10-CM | POA: Diagnosis not present

## 2024-02-03 DIAGNOSIS — R079 Chest pain, unspecified: Secondary | ICD-10-CM | POA: Insufficient documentation

## 2024-02-03 DIAGNOSIS — R42 Dizziness and giddiness: Secondary | ICD-10-CM | POA: Insufficient documentation

## 2024-02-03 DIAGNOSIS — H538 Other visual disturbances: Secondary | ICD-10-CM | POA: Diagnosis not present

## 2024-02-03 NOTE — ED Triage Notes (Signed)
 Pt reports dizziness and chest pain that started 1 hour ago. Pt states feels like the room is spinning. No unilateral weakness or numbness. Pt also reports blurry vision.

## 2024-03-20 ENCOUNTER — Ambulatory Visit (INDEPENDENT_AMBULATORY_CARE_PROVIDER_SITE_OTHER): Payer: MEDICAID | Admitting: Urology

## 2024-03-20 VITALS — BP 136/89 | HR 111

## 2024-03-20 DIAGNOSIS — R3129 Other microscopic hematuria: Secondary | ICD-10-CM

## 2024-03-20 DIAGNOSIS — N99115 Postprocedural fossa navicularis urethral stricture: Secondary | ICD-10-CM | POA: Diagnosis not present

## 2024-03-20 DIAGNOSIS — R350 Frequency of micturition: Secondary | ICD-10-CM

## 2024-03-20 LAB — MICROSCOPIC EXAMINATION: Epithelial Cells (non renal): >10 /HPF — AB (ref 0–10)

## 2024-03-20 LAB — URINALYSIS, COMPLETE
Bilirubin, UA: NEGATIVE
Ketones, UA: NEGATIVE
Leukocytes,UA: NEGATIVE
Nitrite, UA: NEGATIVE
Specific Gravity, UA: >1.03 — ABNORMAL HIGH (ref 1.005–1.030)
Urobilinogen, Ur: 1 mg/dL (ref 0.2–1.0)
pH, UA: 5.5 (ref 5.0–7.5)

## 2024-03-20 NOTE — H&P (View-Only) (Signed)
   03/20/24  CC:  Chief Complaint  Patient presents with   Cysto    HPI: 53 yo M episode of gross hematuria who presents today for further cystoscopic evaluation.  Please see previous notes for details.  Blood pressure 136/89, pulse (!) 111. NED. A&Ox3.   No respiratory distress   Abd soft, NT, ND Normal phallus with bilateral descended testicles  Cystoscopy Procedure Note  Patient identification was confirmed, informed consent was obtained, and patient was prepped using Betadine solution.  Lidocaine  jelly was administered per urethral meatus.     Pre-Procedure: - Inspection reveals a normal caliber ureteral meatus.  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  This was approximately 10 [place] in diameter.  Ethan [name] indicated that he would not be able to tolerate dilation.   Post-Procedure: - Patient tolerated the procedure well  Assessment/ Plan:  1. Other microscopic hematuria (Primary) Unable to fully evaluate bladder secondary to stricture, see below - Urinalysis, Complete  2.  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  Given that we are unable to evaluate his bladder and he has urinary symptoms, I do think he would benefit from urethral dilation.  We discussed urethral dilation with Optilume balloon.  We discussed the risks of the procedure such as infection, recurrent stricture, need for Foley catheter postop for 24 to 48 hours, amongst others.  Preprocedure urine culture sent today.  He is agreeable with this plan.  Will also plan to evaluate his bladder via cystoscopy at the time of the procedure.  All questions were answered. - Urinalysis, Complete    Dustin Gimenez, MD

## 2024-03-20 NOTE — Progress Notes (Unsigned)
   03/20/24  CC:  Chief Complaint  Patient presents with   Cysto    HPI: 53 yo M  Blood pressure 136/89, pulse (!) 111. NED. A&Ox3.   No respiratory distress   Abd soft, NT, ND Normal phallus with bilateral descended testicles  Cystoscopy Procedure Note  Patient identification was confirmed, informed consent was obtained, and patient was prepped using Betadine solution.  Lidocaine  jelly was administered per urethral meatus.     Pre-Procedure: - Inspection reveals a normal caliber ureteral meatus.  Procedure: The flexible cystoscope was introduced without difficulty - No urethral strictures/lesions are present. - {Blank multiple:19197::"Enlarged","Surgically absent","Normal"} prostate *** - {Blank multiple:19197::"Normal","Elevated","Tight"} bladder neck - Bilateral ureteral orifices identified - Bladder mucosa  reveals no ulcers, tumors, or lesions - No bladder stones - No trabeculation  Retroflexion shows ***   Post-Procedure: - Patient tolerated the procedure well  Assessment/ Plan:   No follow-ups on file.  Dustin Gimenez, MD

## 2024-03-21 ENCOUNTER — Other Ambulatory Visit: Payer: Self-pay

## 2024-03-21 DIAGNOSIS — N99115 Postprocedural fossa navicularis urethral stricture: Secondary | ICD-10-CM

## 2024-03-21 NOTE — Progress Notes (Unsigned)
 Surgical Physician Order Form [facility]  Dr. Dustin Gimenez, MD  * Scheduling expectation : Next Available  *Length of Case:   *Clearance needed: no  *Anticoagulation Instructions: Hold all anticoagulants  *Aspirin Instructions: Ok to continue Aspirin  *Post-op visit Date/Instructions:  1-3 day cath removal  *Diagnosis: Ureteral Stricture  *Procedure:  Cysto w/urethral dilation (69629)   Additional orders: N/A  -Admit type: OUTpatient  -Anesthesia: General  -VTE Prophylaxis Standing Order SCD's       Other:   -Standing Lab Orders Per Anesthesia    Lab other: UA&Urine Culture  -Standing Test orders EKG/Chest x-ray per Anesthesia       Test other:   - Medications:  Ancef 2gm IV  -Other orders:  N/A

## 2024-03-22 ENCOUNTER — Telehealth: Payer: Self-pay

## 2024-03-22 NOTE — Telephone Encounter (Signed)
 Per Dr. Ace Holder, Patient is to be scheduled for Cystoscopy with Urethral Balloon Dilation using Optilume   Mr. Wesely was contacted and possible surgical dates were discussed, Thursday May 8th, 2025 was agreed upon for surgery.  Patient was instructed that Dr. Ace Holder will require them to provide a pre-op UA & CX prior to surgery. This was ordered and scheduled drop off appointment was made for 03/26/2024 .   Patient was directed to call [phone] between 1-3pm the day before surgery to find out surgical arrival time.  Instructions were given not to eat or drink from midnight on the night before surgery and have a driver for the day of surgery. On the surgery day patient was instructed to enter through the Medical Mall entrance of [facility] report the Same Day Surgery desk.   Pre-Admit Testing will be in contact via phone to set up an interview with the anesthesia team to review your history and medications prior to surgery.   Reminder of this information was sent via mail to the patient.

## 2024-03-22 NOTE — Progress Notes (Signed)
   [facility] Surgical Posting Form  Surgery Date: Date: 04/05/2024  Surgeon: Dr. Dustin Gimenez, MD  Inpt ( No  )   Outpt (Yes)   Obs ( No  )   Diagnosis: N99.115 Urethral Stricture  -CPT: 40981  Surgery: Cystoscopy with Urethral Balloon Dilation using Optilume   Stop Anticoagulations: Yes, may continue ASA  Cardiac/Medical/Pulmonary Clearance needed: no  *Orders entered into EPIC  Date: 03/22/24   *Case booked in [place]  Date: 03/21/2024  *Notified pt of Surgery: Date: 04/23/20252  PRE-OP UA & CX: yes, will obtain in clinic on 03/26/2024  *Placed into Prior Authorization Work [place] Date: 03/22/24  Assistant/laser/rep:No

## 2024-03-26 ENCOUNTER — Other Ambulatory Visit: Payer: MEDICAID

## 2024-03-26 DIAGNOSIS — N99115 Postprocedural fossa navicularis urethral stricture: Secondary | ICD-10-CM

## 2024-03-26 LAB — MICROSCOPIC EXAMINATION: Bacteria, UA: NONE SEEN

## 2024-03-26 LAB — URINALYSIS, COMPLETE
Bilirubin, UA: NEGATIVE
Ketones, UA: NEGATIVE
Leukocytes,UA: NEGATIVE
Nitrite, UA: NEGATIVE
Protein,UA: NEGATIVE
Specific Gravity, UA: 1.015 (ref 1.005–1.030)
Urobilinogen, Ur: 1 mg/dL (ref 0.2–1.0)
pH, UA: 5 (ref 5.0–7.5)

## 2024-03-28 LAB — CULTURE, URINE COMPREHENSIVE

## 2024-03-29 ENCOUNTER — Encounter
Admission: RE | Admit: 2024-03-29 | Discharge: 2024-03-29 | Disposition: A | Discharge status: Home / Self Care | Payer: MEDICAID | Source: Ambulatory Visit | Attending: Urology | Admitting: Urology

## 2024-03-29 ENCOUNTER — Other Ambulatory Visit: Payer: Self-pay

## 2024-03-29 HISTORY — DX: Unspecified urethral stricture, male, unspecified site: N35.919

## 2024-03-29 HISTORY — DX: Tobacco use: Z72.0

## 2024-03-29 HISTORY — DX: Personal history of diseases of the skin and subcutaneous tissue: Z87.2

## 2024-03-29 HISTORY — DX: Gastro-esophageal reflux disease without esophagitis: K21.9

## 2024-03-29 HISTORY — DX: Other microscopic hematuria: R31.29

## 2024-03-29 NOTE — Patient Instructions (Addendum)
 Your procedure is scheduled on:04-05-24 Thursday Report to the Registration Desk on the 1st floor of the Medical Mall.Then proceed to the 2nd  To find out your arrival time, please call [phone] between 1PM - 3PM on:04-04-24 Wednesday If your arrival time is 6:00 am, do not arrive before that time as the Medical Mall entrance doors do not open until 6:00 am.  REMEMBER: Instructions that are not followed completely may result in serious medical risk, up to and including death; or upon the discretion of your surgeon and anesthesiologist your surgery may need to be rescheduled.  Do not eat food OR drink any liquids after midnight the night before surgery.  No gum chewing or hard candies.  One week prior to surgery:Stop NOW (03-29-24) Stop Anti-inflammatories (NSAIDS) such as Advil, Aleve , Ibuprofen, Motrin, Naproxen , Naprosyn  and Aspirin based products such as Excedrin, Goody's Powder, BC Powder. Stop ANY OVER THE COUNTER supplements until after surgery (Vitamin D)  You may however, continue to take Tylenol  if needed for pain up until the day of surgery.  Continue taking all of your other prescription medications up until the day of surgery.  ON THE DAY OF SURGERY ONLY TAKE THESE MEDICATIONS WITH SIPS OF WATER : -amLODipine (NORVASC)  -omeprazole (PRILOSEC)  -pravastatin (PRAVACHOL)   Use your Albuterol  Inhaler the day of surgery and bring your Albuterol  Inhaler to the hospital  No Alcohol for 24 hours before or after surgery.  No Smoking including e-cigarettes for 24 hours before surgery.  No chewable tobacco products for at least 6 hours before surgery.  No nicotine patches on the day of surgery.  Do not use any "recreational" drugs for at least a week (preferably 2 weeks) before your surgery.  Please be advised that the combination of cocaine and anesthesia may have negative outcomes, up to and including death. If you test positive for cocaine, your surgery will be cancelled.  On  the morning of surgery brush your teeth with toothpaste and water , you may rinse your mouth with mouthwash if you wish. Do not swallow any toothpaste or mouthwash.  Do not wear jewelry, make-up, hairpins, clips or nail polish.  For welded (permanent) jewelry: bracelets, anklets, waist bands, etc.  Please have this removed prior to surgery.  If it is not removed, there is a chance that hospital personnel will need to cut it off on the day of surgery.  Do not wear lotions, powders, or perfumes.   Do not shave body hair from the neck down 48 hours before surgery.  Contact lenses, hearing aids and dentures may not be worn into surgery.  Do not bring valuables to the hospital. [facility] is not responsible for any missing/lost belongings or valuables.    Notify your doctor if there is any change in your medical condition (cold, fever, infection).  Wear comfortable clothing (specific to your surgery type) to the hospital.  After surgery, you can help prevent lung complications by doing breathing exercises.  Take deep breaths and cough every 1-2 hours. Your doctor may order a device called an Incentive Spirometer to help you take deep breaths. When coughing or sneezing, hold a pillow firmly against your incision with both hands. This is called "splinting." Doing this helps protect your incision. It also decreases belly discomfort.  If you are being admitted to the hospital overnight, leave your suitcase in the car. After surgery it may be brought to your room.  In case of increased patient census, it may be necessary for you,  the patient, to continue your postoperative care in the Same Day Surgery department.  If you are being discharged the day of surgery, you will not be allowed to drive home. You will need a responsible individual to drive you home and stay with you for 24 hours after surgery.   If you are taking public transportation, you will need to have a responsible individual with  you.  Please call the Pre-admissions Testing Dept. at [phone] if you have any questions about these instructions.  Surgery Visitation Policy:  Patients having surgery or a procedure may have two visitors.  Children under the age of 68 must have an adult with them who is not the patient.

## 2024-03-29 NOTE — Progress Notes (Signed)
 Pt does not have My Chart so pt wants surgery instructions emailed. I went over instructions over the phone and emailed the instructions to [email or web address]

## 2024-04-04 MED ORDER — CEFAZOLIN SODIUM-DEXTROSE 2-4 GM/100ML-% IV SOLN
2.0000 g | INTRAVENOUS | Status: DC
Start: 2024-04-04 — End: 2024-04-05

## 2024-04-04 MED ORDER — CHLORHEXIDINE GLUCONATE 0.12 % MT SOLN
15.0000 mL | Freq: Once | OROMUCOSAL | Status: AC
  Administered 2024-04-05: 15 mL via OROMUCOSAL

## 2024-04-04 MED ORDER — LACTATED RINGERS IV SOLN: INTRAVENOUS | Status: DC

## 2024-04-04 MED ORDER — ORAL CARE MOUTH RINSE: 15.0000 mL | Freq: Once | OROMUCOSAL | Status: AC

## 2024-04-05 ENCOUNTER — Ambulatory Visit: Payer: MEDICAID | Admitting: Anesthesiology

## 2024-04-05 ENCOUNTER — Encounter: Payer: Self-pay | Admitting: Urology

## 2024-04-05 ENCOUNTER — Other Ambulatory Visit: Payer: Self-pay

## 2024-04-05 ENCOUNTER — Ambulatory Visit
Admission: RE | Admit: 2024-04-05 | Discharge: 2024-04-05 | Disposition: A | Discharge status: Home / Self Care | Payer: MEDICAID | Attending: Urology | Admitting: Urology

## 2024-04-05 ENCOUNTER — Encounter
Admission: RE | Disposition: A | Discharge status: Home / Self Care | Payer: Self-pay | Source: Home / Self Care | Attending: Urology

## 2024-04-05 DIAGNOSIS — N35919 Unspecified urethral stricture, male, unspecified site: Secondary | ICD-10-CM | POA: Insufficient documentation

## 2024-04-05 DIAGNOSIS — N99115 Postprocedural fossa navicularis urethral stricture: Secondary | ICD-10-CM

## 2024-04-05 DIAGNOSIS — K219 Gastro-esophageal reflux disease without esophagitis: Secondary | ICD-10-CM | POA: Diagnosis not present

## 2024-04-05 DIAGNOSIS — F17200 Nicotine dependence, unspecified, uncomplicated: Secondary | ICD-10-CM | POA: Diagnosis not present

## 2024-04-05 DIAGNOSIS — I10 Essential (primary) hypertension: Secondary | ICD-10-CM | POA: Diagnosis not present

## 2024-04-05 DIAGNOSIS — R3129 Other microscopic hematuria: Secondary | ICD-10-CM | POA: Insufficient documentation

## 2024-04-05 DIAGNOSIS — J45909 Unspecified asthma, uncomplicated: Secondary | ICD-10-CM | POA: Insufficient documentation

## 2024-04-05 HISTORY — PX: CYSTOSCOPY WITH URETHRAL DILATATION: SHX5125

## 2024-04-05 SURGERY — CYSTOSCOPY, WITH URETHRAL DILATION
Anesthesia: General

## 2024-04-05 MED ORDER — MIDAZOLAM HCL 2 MG/2ML IJ SOLN
INTRAMUSCULAR | Status: DC | PRN
  Administered 2024-04-05: 2 mg via INTRAVENOUS

## 2024-04-05 MED ORDER — CEFAZOLIN SODIUM-DEXTROSE 2-4 GM/100ML-% IV SOLN
INTRAVENOUS | Status: AC
  Filled 2024-04-05: qty 100

## 2024-04-05 MED ORDER — DEXMEDETOMIDINE HCL IN NACL 80 MCG/20ML IV SOLN
INTRAVENOUS | Status: DC | PRN
  Administered 2024-04-05: 4 ug via INTRAVENOUS
  Administered 2024-04-05: 8 ug via INTRAVENOUS
  Administered 2024-04-05 (×2): 4 ug via INTRAVENOUS

## 2024-04-05 MED ORDER — FENTANYL CITRATE (PF) 100 MCG/2ML IJ SOLN
INTRAMUSCULAR | Status: DC | PRN
Start: 2024-04-05 — End: 2024-04-05
  Administered 2024-04-05 (×2): 50 ug via INTRAVENOUS

## 2024-04-05 MED ORDER — ONDANSETRON HCL 4 MG/2ML IJ SOLN: 4.0000 mg | Freq: Once | INTRAMUSCULAR | Status: DC | PRN

## 2024-04-05 MED ORDER — OXYBUTYNIN CHLORIDE 5 MG PO TABS: 5.0000 mg | ORAL_TABLET | Freq: Three times a day (TID) | ORAL | 0 refills | Status: DC | PRN

## 2024-04-05 MED ORDER — FENTANYL CITRATE (PF) 100 MCG/2ML IJ SOLN
INTRAMUSCULAR | Status: AC
  Filled 2024-04-05: qty 2

## 2024-04-05 MED ORDER — OXYCODONE HCL 5 MG PO TABS
5.0000 mg | ORAL_TABLET | Freq: Once | ORAL | Status: AC | PRN
  Administered 2024-04-05: 5 mg via ORAL

## 2024-04-05 MED ORDER — FENTANYL CITRATE (PF) 100 MCG/2ML IJ SOLN
25.0000 ug | INTRAMUSCULAR | Status: DC | PRN
  Administered 2024-04-05: 25 ug via INTRAVENOUS
  Administered 2024-04-05: 50 ug via INTRAVENOUS
  Administered 2024-04-05: 25 ug via INTRAVENOUS

## 2024-04-05 MED ORDER — CHLORHEXIDINE GLUCONATE 0.12 % MT SOLN
OROMUCOSAL | Status: AC
Start: 2024-04-05 — End: ?
  Filled 2024-04-05: qty 15

## 2024-04-05 MED ORDER — GLYCOPYRROLATE 0.2 MG/ML IJ SOLN
INTRAMUSCULAR | Status: DC | PRN
  Administered 2024-04-05: .2 mg via INTRAVENOUS

## 2024-04-05 MED ORDER — OXYCODONE HCL 5 MG PO TABS
ORAL_TABLET | ORAL | Status: AC
  Filled 2024-04-05: qty 1

## 2024-04-05 MED ORDER — ACETAMINOPHEN 10 MG/ML IV SOLN: 1000.0000 mg | Freq: Once | INTRAVENOUS | Status: DC | PRN

## 2024-04-05 MED ORDER — OXYCODONE HCL 5 MG/5ML PO SOLN: 5.0000 mg | Freq: Once | ORAL | Status: AC | PRN

## 2024-04-05 MED ORDER — FUROSEMIDE 10 MG/ML IJ SOLN
INTRAMUSCULAR | Status: AC
  Filled 2024-04-05: qty 4

## 2024-04-05 MED ORDER — PROPOFOL 500 MG/50ML IV EMUL
INTRAVENOUS | Status: DC | PRN
  Administered 2024-04-05: 150 ug/kg/min via INTRAVENOUS

## 2024-04-05 MED ORDER — MIDAZOLAM HCL 2 MG/2ML IJ SOLN
INTRAMUSCULAR | Status: AC
  Filled 2024-04-05: qty 2

## 2024-04-05 MED ORDER — PROPOFOL 1000 MG/100ML IV EMUL
INTRAVENOUS | Status: AC
  Filled 2024-04-05: qty 100

## 2024-04-05 MED ORDER — ONDANSETRON HCL 4 MG/2ML IJ SOLN
INTRAMUSCULAR | Status: DC | PRN
  Administered 2024-04-05: 4 mg via INTRAVENOUS

## 2024-04-05 MED ORDER — SODIUM CHLORIDE 0.9 % IR SOLN
Status: DC | PRN
  Administered 2024-04-05: 1000 mL

## 2024-04-05 SURGICAL SUPPLY — 19 items
BAG URINE DRAIN 2000ML AR STRL (UROLOGICAL SUPPLIES) ×1 IMPLANT
BALLOON OPTILUME DCB 24X5X75 (BALLOONS) IMPLANT
BALLOON OPTILUME DCB 30X3X75 (BALLOONS) IMPLANT
BALLOON OPTILUME DCB 30X5X75 (BALLOONS) IMPLANT
CATH FOL 2WAY LX 16X5 (CATHETERS) IMPLANT
CATH FOL 2WAY LX 18X30 (CATHETERS) IMPLANT
CATH FOLEY 2W COUNCIL 20FR 5CC (CATHETERS) IMPLANT
CATH URETHRAL DIL 7.0X29 (CATHETERS) ×1 IMPLANT
DEVICE INFLATION 20/61 (MISCELLANEOUS) IMPLANT
ELECTRODE REM PT RTRN 9FT ADLT (ELECTROSURGICAL) IMPLANT
GLOVE BIO SURGEON STRL SZ 6.5 (GLOVE) ×1 IMPLANT
GOWN STRL REUS W/ TWL LRG LVL3 (GOWN DISPOSABLE) ×2 IMPLANT
GUIDEWIRE STR DUAL SENSOR (WIRE) ×1 IMPLANT
PACK CYSTO AR (MISCELLANEOUS) ×1 IMPLANT
SET CYSTO W/LG BORE CLAMP LF (SET/KITS/TRAYS/PACK) ×1 IMPLANT
SYR 10ML LL (SYRINGE) ×1 IMPLANT
SYR 30ML LL (SYRINGE) ×1 IMPLANT
WATER STERILE IRR 3000ML UROMA (IV SOLUTION) ×1 IMPLANT
WATER STERILE IRR 500ML POUR (IV SOLUTION) ×1 IMPLANT

## 2024-04-05 NOTE — Discharge Instructions (Signed)
 Your catheter may be removed on Saturday morning.  You will be provided with a syringe to deflate the balloon on the catheter.  There should be about 10 cc in the balloon.  Once all of the water  is evacuated, gently pull out the catheter.  There should be no resistance.  If you have any issues, please call the on-call nurse for our practice or go to urgent care.    Foley Catheter Care A soft, flexible tube (Foley catheter) may have been placed in your bladder to drain urine and fluid. Follow these instructions: Taking Care of the Catheter Keep the area where the catheter leaves your body clean.  Attach the catheter to the leg so there is no tension on the catheter.  Keep the drainage bag below the level of the bladder, but keep it OFF the floor.  Do not take long soaking baths. Your caregiver will give instructions about showering.  Wash your hands before touching ANYTHING related to the catheter or bag.  Using mild soap and warm water  on a washcloth:  Clean the area closest to the catheter insertion site using a circular motion around the catheter.  Clean the catheter itself by wiping AWAY from the insertion site for several inches down the tube.  NEVER wipe upward as this could sweep bacteria up into the urethra (tube in your body that normally drains the bladder) and cause infection.  Place a small amount of sterile lubricant at the tip of the penis where the catheter is entering.  Taking Care of the Drainage Bags Two drainage bags may be taken home: a large overnight drainage bag, and a smaller leg bag which fits underneath clothing.  It is okay to wear the overnight bag at any time, but NEVER wear the smaller leg bag at night.  Keep the drainage bag well below the level of your bladder. This prevents backflow of urine into the bladder and allows the urine to drain freely.  Anchor the tubing to your leg to prevent pulling or tension on the catheter. Use tape or a leg strap provided by the  hospital.  Empty the drainage bag when it is 1/2 to 3/4 full. Wash your hands before and after touching the bag.  Periodically check the tubing for kinks to make sure there is no pressure on the tubing which could restrict the flow of urine.  Changing the Drainage Bags Cleanse both ends of the clean bag with alcohol before changing.  Pinch off the rubber catheter to avoid urine spillage during the disconnection.  Disconnect the dirty bag and connect the clean one.  Empty the dirty bag carefully to avoid a urine spill.  Attach the new bag to the leg with tape or a leg strap.  Cleaning the Drainage Bags Whenever a drainage bag is disconnected, it must be cleaned quickly so it is ready for the next use.  Wash the bag in warm, soapy water .  Rinse the bag thoroughly with warm water .  Soak the bag for 30 minutes in a solution of white vinegar and water  (1 cup vinegar to 1 quart warm water ).  Rinse with warm water .  SEEK MEDICAL CARE IF:  You have chills or night sweats.  You are leaking around your catheter or have problems with your catheter. It is not uncommon to have sporadic leakage around your catheter as a result of bladder spasms. If the leakage stops, there is not much need for concern. If you are uncertain, call your caregiver.  You develop side effects that you think are coming from your medicines.  SEEK IMMEDIATE MEDICAL CARE IF:  You are suddenly unable to urinate. Check to see if there are any kinks in the drainage tubing that may cause this. If you cannot find any kinks, call your caregiver immediately. This is an emergency.  You develop shortness of breath or chest pains.  Bleeding persists or clots develop in your urine.  You have a fever.  You develop pain in your back or over your lower belly (abdomen).  You develop pain or swelling in your legs.  Any problems you are having get worse rather than better.  MAKE SURE YOU:  Understand these instructions.  Will watch your  condition.  Will get help right away if you are not doing well or get worse.

## 2024-04-05 NOTE — Op Note (Signed)
 Date of procedure: 04/05/24  Preoperative diagnosis:  Fossa navicularis stricture Microscopic hematuria  Postoperative diagnosis:  See above  Procedure: Urethral dilation of fossa navicularis stricture using Optilume balloon Cystoscopy  Surgeon: Dustin Gimenez, MD  Anesthesia: MAC  Complications: None  Intraoperative findings: Relatively tight distal urethral stricture within the fossa navicularis, remainder of the urethra was normal, healthy, no bladder pathology identified.  Normal prostate.  EBL: minimal  Specimens: None  Drains: 20 [place] council tip catheter with 10 cc in the balloon  Indication: Ethan [name]. is a 53 y.o. patient with microscopic hematuria and a significant fossa navicularis stricture preventing office cystoscopy.  After reviewing the management options for treatment, he elected to proceed with the above surgical procedure(s). We have discussed the potential benefits and risks of the procedure, side effects of the proposed treatment, the likelihood of the patient achieving the goals of the procedure, and any potential problems that might occur during the procedure or recuperation. Informed consent has been obtained.  Description of procedure:  The patient was taken to the operating room and the patient was sedated using monitored anesthesia.  The patient was placed in the lithotomy position, prepped and draped in the usual sterile fashion, and preoperative antibiotics were administered. A preoperative time-out was performed.   The pinpoint opening was seen at the urethral meatus.  It was unable to pass anything but a wire through this.  I used a Cook urethral dilation balloon to gently dilate the opening of the urethra.  I then was able to advance a 21 French cystoscope all the way to the level of the bladder.  The remainder of the urethra was unremarkable, relatively small prostate and normal bladder.  There is no pathology identified.  The wire was  left in place.  I then advanced an Optilume balloon into the fossa navicularis stricture using a 24 French balloon and dilated the balloon to 10 atm.  This is left in place for total of 5 minutes.  At the end of 5 minutes the balloon was removed and a council tip catheter, 20 [place] was placed over the wire into the bladder successfully.  The balloon was filled with 10 cc.  The wire was removed.  There is clear yellow urinary drainage.  The patient was then cleaned and dried, reversed from sedation and taken to the PACU stable condition.  Plan: He will remove his own catheter in 2 days.  He should follow-up with one of our PAs in 6 weeks for reassessment of his urinary symptoms.  Dustin Gimenez, M.D.

## 2024-04-05 NOTE — Anesthesia Preprocedure Evaluation (Signed)
 Anesthesia Evaluation  Patient identified by MRN, date of birth, ID band Patient awake    Reviewed: Allergy & Precautions, NPO status , Patient's Chart, lab work & pertinent test results  History of Anesthesia Complications Negative for: history of anesthetic complications  Airway Mallampati: III  TM Distance: >3 FB Neck ROM: Full    Dental no notable dental hx. (+) Teeth Intact   Pulmonary asthma , neg sleep apnea, neg COPD, Recent URI , Residual Cough, Current SmokerPatient did not abstain from smoking. Patient states he had a mild URI the past few days, with the only symptoms being a mild throat clearing cough. Denies fevers, SOB, or needing to use his inhaler more often. He is also a smoker which confounds the cough.   Pulmonary exam normal breath sounds clear to auscultation       Cardiovascular Exercise Tolerance: Good METShypertension, Pt. on medications (-) CAD and (-) Past MI (-) dysrhythmias  Rhythm:Regular Rate:Normal - Systolic murmurs    Neuro/Psych negative neurological ROS  negative psych ROS   GI/Hepatic ,GERD  Medicated and Controlled,,(+)     (-) substance abuse    Endo/Other  neg diabetes    Renal/GU negative Renal ROS     Musculoskeletal   Abdominal  (+) + obese  Peds  Hematology   Anesthesia Other Findings Past Medical History: No date: Asthma No date: Cellulitis     Comment:  hospitalized 07/2013 No date: Diverticulitis     Comment:  06/2012 No date: Diverticulitis No date: GERD (gastroesophageal reflux disease) No date: Gout No date: History of cellulitis No date: Hypercholesterolemia No date: Hypertension No date: Hypokalemia No date: Microscopic hematuria No date: Pneumonia No date: Tobacco use No date: Urethral stricture  Reproductive/Obstetrics                             Anesthesia Physical Anesthesia Plan  ASA: 2  Anesthesia Plan: General    Post-op Pain Management: Ofirmev  IV (intra-op)*   Induction: Intravenous  PONV Risk Score and Plan: 2 and Propofol  infusion, TIVA, Ondansetron , Midazolam  and Dexamethasone   Airway Management Planned: Nasal Cannula  Additional Equipment: None  Intra-op Plan:   Post-operative Plan:   Informed Consent: I have reviewed the patients History and Physical, chart, labs and discussed the procedure including the risks, benefits and alternatives for the proposed anesthesia with the patient or authorized representative who has indicated his/her understanding and acceptance.     Dental advisory given  Plan Discussed with: CRNA and Surgeon  Anesthesia Plan Comments: (Discussed r/b/a of proceeding today vs delaying until full resolution of mild URI. Given that patient has mild symptoms, and we are not planning on airway instrumentation, I did advise that it would not be unreasonable or unsafe to proceed to day if he understood the potential increased respiratory risks,. Patient understands and wishes to proceed today. Recent natural airway colonoscopy anesthetic record reviewed, without documented complications.  risks of anesthesia with patient, including possibility of difficulty with spontaneous ventilation under anesthesia necessitating airway intervention, PONV, and rare risks such as cardiac or respiratory or neurological events, and allergic reactions. Discussed the role of CRNA in patient's perioperative care. Patient understands. Patient counseled on benefits of smoking cessation, and increased perioperative risks associated with continued smoking. )       Anesthesia Quick Evaluation

## 2024-04-05 NOTE — Interval H&P Note (Signed)
 History and Physical Interval Note:  04/05/2024 11:18 AM  Ethan [name].  has presented today for surgery, with the diagnosis of Urethral Stricture.  The various methods of treatment have been discussed with the patient and family. After consideration of risks, benefits and other options for treatment, the patient has consented to  Procedure(s) with comments: CYSTOSCOPY, WITH URETHRAL DILATION (N/A) - BALLOON DILATION USING OPTILUME as a surgical intervention.  The patient's history has been reviewed, patient examined, no change in status, stable for surgery.  I have reviewed the patient's chart and labs.  Questions were answered to the patient's satisfaction.    RRR CTAB   Dustin Gimenez

## 2024-04-05 NOTE — Anesthesia Postprocedure Evaluation (Signed)
 Anesthesia Post Note  Patient: Ethan [name].  Procedure(s) Performed: CYSTOSCOPY, WITH URETHRAL DILATION  Patient location during evaluation: PACU Anesthesia Type: General Level of consciousness: awake and alert Pain management: pain level controlled Vital Signs Assessment: post-procedure vital signs reviewed and stable Respiratory status: spontaneous breathing, nonlabored ventilation, respiratory function stable and patient connected to nasal cannula oxygen  Cardiovascular status: blood pressure returned to baseline and stable Postop Assessment: no apparent nausea or vomiting Anesthetic complications: no   No notable events documented.   Last Vitals:  Vitals:   04/05/24 1245 04/05/24 1300  BP: 117/84 123/85  Pulse: 86 90  Resp: 16 17  Temp:  36.4 C  SpO2: 100% 98%    Last Pain:  Vitals:   04/05/24 1300  TempSrc:   PainSc: 5                  Lattie Poli

## 2024-04-05 NOTE — Transfer of Care (Signed)
 Immediate Anesthesia Transfer of Care Note  Patient: [name].  Procedure(s) Performed: CYSTOSCOPY, WITH URETHRAL DILATION  Patient Location: PACU  Anesthesia Type:General  Level of Consciousness: awake, drowsy, and patient cooperative  Airway & Oxygen  Therapy: Patient Spontanous Breathing and Patient connected to face mask oxygen   Post-op Assessment: Report given to RN, Post -op Vital signs reviewed and stable, and Patient moving all extremities X 4  Post vital signs: Reviewed and stable  Last Vitals:  Vitals Value Taken Time  BP 106/56 04/05/24 1220  Temp    Pulse 106 04/05/24 1221  Resp 19 04/05/24 1221  SpO2 94 % 04/05/24 1221  Vitals shown include unfiled device data.  Last Pain:  Vitals:   04/05/24 0930  TempSrc: Temporal  PainSc: 0-No pain         Complications: No notable events documented.

## 2024-04-06 ENCOUNTER — Telehealth: Payer: Self-pay

## 2024-04-06 ENCOUNTER — Encounter: Payer: Self-pay | Admitting: Urology

## 2024-04-06 NOTE — Telephone Encounter (Signed)
 Received a fax from Access nurse from 04/05/24, stated patient called in due to right lip swelling he was having at home and had procedure done at the hospital by Dr Ace Holder earlier in the day and wonders if that is normal.  I called patient to follow up today, lip swelling has improved and thinks he bit his lip when he was asleep. Nothing further is needed.

## 2024-04-09 ENCOUNTER — Encounter: Payer: MEDICAID | Admitting: Physician Assistant

## 2024-04-16 ENCOUNTER — Encounter: Payer: Self-pay | Admitting: Urology

## 2024-04-26 ENCOUNTER — Ambulatory Visit: Payer: Self-pay

## 2024-04-26 ENCOUNTER — Telehealth: Payer: Self-pay

## 2024-04-26 NOTE — Telephone Encounter (Signed)
  Chief Complaint: urinary symptoms, genital symptoms Symptoms: dark urine (brown), erectile dysfunction, tip of penis skin color change Frequency: x couple of days Pertinent Negatives: Patient denies fever, blood in urine, abdominal or back pain, body aches, joint pain Disposition: [] ED /[] Urgent Care (no appt availability in office) / [x] Appointment(In office/virtual)/ []  [facility]/ [] Home Care/ [] Refused Recommended Disposition /[] [facility]/ []  Follow-up with PCP Additional Notes: Scheduled patient for new patient appointment with preferred office. Advised patient he needs to be seen sooner, provided him with his urologist phone # and he states he will call to get a sooner appointment with urology.  Copied from CRM [id number]. Topic: Clinical - Red Word Triage >> Apr 26, 2024  1:37 PM Santiya F wrote: Red Word that prompted transfer to Nurse Triage: Patient's skin on his genitals is turning pinkish white, he is having issues with erection and his urine is still dark. Reason for Disposition  All other urine symptoms  Answer Assessment - Initial Assessment Questions 1. SYMPTOM: "What's the main symptom you're concerned about?" (e.g., frequency, incontinence)     Stinging sensation when urinating, brown/tea colored urine, difficulty getting an erection, tip of penis near urethra pinkish white color  2. ONSET: "When did the  symptoms  start?"     X couple of days.  3. PAIN: "Is there any pain?" If Yes, ask: "How bad is it?" (Scale: 1-10; mild, moderate, severe)     No.  4. CAUSE: "What do you think is causing the symptoms?"     Unsure.  5. OTHER SYMPTOMS: "Do you have any other symptoms?" (e.g., blood in urine, fever, flank pain, pain with urination)     No.  6. PREGNANCY: "Is there any chance you are pregnant?" "When was your last menstrual period?"     N/A.  Protocols used: Urinary Symptoms-A-AH

## 2024-04-26 NOTE — Telephone Encounter (Addendum)
 Pt LVM the triage line stating his "head of penis is deteriorating", stinging with urination, is having issues with erections.  Called pt to get more information on his symptoms. Pt has surgery on 04/05/2024. Pt states that these issues have been going on since he took his cath out on 04/07/2024 as instructed. Pt states the head of his penis is turning white and urine is brown/tea color. Pt was scheduled for an appointment with Sam for tomorrow . Pt aware of appt

## 2024-04-27 ENCOUNTER — Ambulatory Visit: Payer: MEDICAID | Admitting: Physician Assistant

## 2024-04-27 ENCOUNTER — Other Ambulatory Visit: Payer: Self-pay | Admitting: Physician Assistant

## 2024-04-27 ENCOUNTER — Encounter: Payer: Self-pay | Admitting: Physician Assistant

## 2024-04-27 VITALS — BP 123/82 | HR 109 | Ht 75.0 in | Wt 305.6 lb

## 2024-04-27 DIAGNOSIS — N529 Male erectile dysfunction, unspecified: Secondary | ICD-10-CM

## 2024-04-27 DIAGNOSIS — L819 Disorder of pigmentation, unspecified: Secondary | ICD-10-CM | POA: Diagnosis not present

## 2024-04-27 DIAGNOSIS — R3129 Other microscopic hematuria: Secondary | ICD-10-CM | POA: Diagnosis not present

## 2024-04-27 LAB — URINALYSIS, COMPLETE
Bilirubin, UA: NEGATIVE
Leukocytes,UA: NEGATIVE
Nitrite, UA: NEGATIVE
Protein,UA: NEGATIVE
Specific Gravity, UA: 1.02 (ref 1.005–1.030)
Urobilinogen, Ur: 2 mg/dL — ABNORMAL HIGH (ref 0.2–1.0)
pH, UA: 6 (ref 5.0–7.5)

## 2024-04-27 LAB — BLADDER SCAN AMB NON-IMAGING

## 2024-04-27 LAB — MICROSCOPIC EXAMINATION: Epithelial Cells (non renal): >10 /HPF — AB (ref 0–10)

## 2024-04-27 MED ORDER — CLOTRIMAZOLE 1 % EX CREA: 1.0000 | TOPICAL_CREAM | Freq: Two times a day (BID) | CUTANEOUS | 0 refills | Status: DC

## 2024-04-27 MED ORDER — TADALAFIL 20 MG PO TABS: ORAL_TABLET | ORAL | 0 refills | Status: DC

## 2024-04-27 NOTE — Progress Notes (Signed)
04/27/2024 2:27 PM   Ethan [name]. 1971/05/05 [id number]  CC: Chief Complaint  Patient presents with   Dysuria   HPI: Ethan [name]. is a 53 y.o. male with PMH fossa navicularis stricture and microscopic hematuria s/p Optilume dilation and cystoscopy with Dr. Ace Holder 22 days ago who presents today for evaluation of possible UTI.   Today he reports he removed his Foley catheter on POD2 as instructed. He has a history of hypopigmentation of the glans penis, however this has been worsening this month and accompanied by tingling/irritation of the glans. He denies other areas of hypopigmentation.  He also reports new ED this month, no history of sickle cell, no prior ED. He does have a history of hypertension, but no recent med changes.  In-office UA today positive for 3+ glucose, 2+ blood, 2.0 EU/dL urobilinogin, and trace ketones; urine microscopy with 3-10 RBCs/HPF and >10 epithelial cells/HPF. PVR 0mL.  PMH: Past Medical History:  Diagnosis Date   Asthma    Cellulitis    hospitalized 07/2013   Diverticulitis    06/2012   Diverticulitis    GERD (gastroesophageal reflux disease)    Gout    History of cellulitis    Hypercholesterolemia    Hypertension    Hypokalemia    Microscopic hematuria    Pneumonia    Tobacco use    Urethral stricture     Surgical History: Past Surgical History:  Procedure Laterality Date   ARM WOUND REPAIR / CLOSURE     COLONOSCOPY WITH PROPOFOL  N/A 12/12/2023   Procedure: COLONOSCOPY WITH PROPOFOL ;  Surgeon: Suzette Espy, MD;  Location: [facility];  Service: Endoscopy;  Laterality: N/A;  7:30 am, asa 2   CYSTOSCOPY WITH URETHRAL DILATATION N/A 04/05/2024   Procedure: CYSTOSCOPY, WITH URETHRAL DILATION;  Surgeon: Dustin Gimenez, MD;  Location: [facility];  Service: Urology;  Laterality: N/A;  BALLOON DILATION USING OPTILUME   ESOPHAGOGASTRODUODENOSCOPY N/A 11/14/2013   [id number]:[id number] hernia; otherwise negative examination   WRIST  ARTHROCENTESIS     WRIST SURGERY  12/2013   right    Home Medications:  Allergies as of 04/27/2024       Reactions   Penicillins Rash        Medication List        Accurate as of Apr 27, 2024  2:27 PM. If you have any questions, ask your nurse or doctor.          albuterol  108 (90 Base) MCG/ACT inhaler Commonly known as: VENTOLIN  HFA Inhale 2 puffs into the lungs every 6 (six) hours as needed for wheezing or shortness of breath.   albuterol  (2.5 MG/3ML) 0.083% nebulizer solution Commonly known as: PROVENTIL  Take 3 mLs (2.5 mg total) by nebulization every 6 (six) hours as needed for wheezing or shortness of breath.   allopurinol 300 MG tablet Commonly known as: ZYLOPRIM Take 300 mg by mouth every morning.   amLODipine 10 MG tablet Commonly known as: NORVASC Take 10 mg by mouth every morning.   Cholecalciferol 1.25 MG (50000 UT) Tabs Take 1 tablet by mouth every morning.   hydrochlorothiazide  12.5 MG tablet Commonly known as: HYDRODIURIL  Take 2 tablets (25 mg total) by mouth daily.   lisinopril  20 MG tablet Commonly known as: ZESTRIL  Take 20 mg by mouth every morning.   omeprazole 40 MG capsule Commonly known as: PRILOSEC Take 40 mg by mouth every morning.   oxybutynin  5 MG tablet Commonly known as: DITROPAN  Take 1 tablet (  5 mg total) by mouth every 8 (eight) hours as needed for bladder spasms.   pravastatin 40 MG tablet Commonly known as: PRAVACHOL Take 40 mg by mouth every morning.        Allergies:  Allergies  Allergen Reactions   Penicillins Rash    Family History: Family History  Problem Relation Age of Onset   Colon cancer Neg Hx     Social History:   reports that he has been smoking cigarettes. He started smoking about 35 years ago. He has a 17.7 pack-year smoking history. He has never used smokeless tobacco. He reports current alcohol use of about 1.0 standard drink of alcohol per week. He reports that he does not use  drugs.  Physical Exam: BP 123/82   Pulse (!) 109   Ht 6\' 3"  (1.905 m)   Wt (!) 305 lb 9.6 oz (138.6 kg)   BMI 38.20 kg/m   Constitutional:  Alert and oriented, no acute distress, nontoxic appearing HEENT: , AT Cardiovascular: No clubbing, cyanosis, or edema Respiratory: Normal respiratory effort, no increased work of breathing GU: Circumcised penis. Irregular hypopigmentation of the glans penis extending up the penile shaft. Meatal stenosis. Skin: No rashes, bruises or suspicious lesions Neurologic: Grossly intact, no focal deficits, moving all 4 extremities Psychiatric: Normal mood and affect  Laboratory Data: Results for orders placed or performed in visit on 04/27/24  Microscopic Examination   Collection Time: 04/27/24  2:08 PM   Urine  Result Value Ref Range   WBC, UA 0-5 0 - 5 /hpf   RBC, Urine 3-10 (A) 0 - 2 /hpf   Epithelial Cells (non renal) >10 (A) 0 - 10 /hpf   Bacteria, UA Few None seen/Few  Urinalysis, Complete   Collection Time: 04/27/24  2:08 PM  Result Value Ref Range   Specific Gravity, UA 1.020 1.005 - 1.030   pH, UA 6.0 5.0 - 7.5   Color, UA Yellow Yellow   Appearance Ur Clear Clear   Leukocytes,UA Negative Negative   Protein,UA Negative Negative/Trace   Glucose, UA 3+ (A) Negative   Ketones, UA Trace (A) Negative   RBC, UA 2+ (A) Negative   Bilirubin, UA Negative Negative   Urobilinogen, Ur 2.0 (H) 0.2 - 1.0 mg/dL   Nitrite, UA Negative Negative   Microscopic Examination See below:   BLADDER SCAN AMB NON-IMAGING   Collection Time: 04/27/24  2:15 PM  Result Value Ref Range   Scan Result 0ml    Assessment & Plan:   1. Hypopigmentation (Primary) Worsening hypopigmentation and glans irritation in the past month. With history of distal stricture and meatal stenosis on physical exam today, question BXO. Will treat with topical clotrimazole  in case of superimposed fungal infection and refer to Derm for further evaluation. - BLADDER SCAN AMB  NON-IMAGING - Urinalysis, Complete - Ambulatory referral to Dermatology - clotrimazole  (LOTRIMIN ) 1 % cream; Apply 1 Application topically 2 (two) times daily.  Dispense: 30 g; Refill: 0  2. Erectile dysfunction, unspecified erectile dysfunction type I don't think this is related to #1 above or his postop status. We discussed I expect this is most likely coincidental with contributors including HTN, antihypertensives, and age. Will start tadalafil . - tadalafil  (CIALIS ) 20 MG tablet; Take one tablet at least 60 minutes prior to sexual activity.  Dispense: 30 tablet; Refill: 0   Return if symptoms worsen or fail to improve.  Kathreen Pare, PA-C  [facility] [address] [place], [place] [address] [phone]  

## 2024-05-17 ENCOUNTER — Ambulatory Visit: Payer: MEDICAID | Admitting: Physician Assistant

## 2024-05-19 ENCOUNTER — Emergency Department (HOSPITAL_COMMUNITY): Payer: MEDICAID

## 2024-05-19 ENCOUNTER — Other Ambulatory Visit: Payer: Self-pay

## 2024-05-19 ENCOUNTER — Emergency Department (HOSPITAL_COMMUNITY)
Admission: EM | Admit: 2024-05-19 | Discharge: 2024-05-19 | Disposition: A | Discharge status: Home / Self Care | Payer: MEDICAID | Attending: Emergency Medicine | Admitting: Emergency Medicine

## 2024-05-19 DIAGNOSIS — J45901 Unspecified asthma with (acute) exacerbation: Secondary | ICD-10-CM | POA: Insufficient documentation

## 2024-05-19 DIAGNOSIS — R059 Cough, unspecified: Secondary | ICD-10-CM | POA: Diagnosis present

## 2024-05-19 DIAGNOSIS — Z720 Tobacco use: Secondary | ICD-10-CM | POA: Insufficient documentation

## 2024-05-19 LAB — BASIC METABOLIC PANEL WITH GFR
Anion gap: 13 (ref 5–15)
BUN: 5 mg/dL — ABNORMAL LOW (ref 6–20)
CO2: 25 mmol/L (ref 22–32)
Calcium: 8.9 mg/dL (ref 8.9–10.3)
Chloride: 102 mmol/L (ref 98–111)
Creatinine, Ser: 0.91 mg/dL (ref 0.61–1.24)
GFR, Estimated: >60 mL/min (ref >60)
Glucose, Bld: 160 mg/dL — ABNORMAL HIGH (ref 70–99)
Potassium: 3.3 mmol/L — ABNORMAL LOW (ref 3.5–5.1)
Sodium: 140 mmol/L (ref 135–145)

## 2024-05-19 LAB — RESP PANEL BY RT-PCR (RSV, FLU A&B, COVID)  RVPGX2
Influenza A by PCR: NEGATIVE
Influenza B by PCR: NEGATIVE
Resp Syncytial Virus by PCR: NEGATIVE
SARS Coronavirus 2 by RT PCR: NEGATIVE

## 2024-05-19 LAB — CBC
HCT: 39.8 % (ref 39.0–52.0)
Hemoglobin: 13.2 g/dL (ref 13.0–17.0)
MCH: 32.8 pg (ref 26.0–34.0)
MCHC: 33.2 g/dL (ref 30.0–36.0)
MCV: 99 fL (ref 80.0–100.0)
Platelets: 213 10*3/uL (ref 150–400)
RBC: 4.02 MIL/uL — ABNORMAL LOW (ref 4.22–5.81)
RDW: 13.2 % (ref 11.5–15.5)
WBC: 5.5 10*3/uL (ref 4.0–10.5)
nRBC: 0 % (ref 0.0–0.2)

## 2024-05-19 LAB — TROPONIN I (HIGH SENSITIVITY): Troponin I (High Sensitivity): 8 ng/L (ref <18)

## 2024-05-19 MED ORDER — ALBUTEROL SULFATE HFA 108 (90 BASE) MCG/ACT IN AERS: 2.0000 | INHALATION_SPRAY | RESPIRATORY_TRACT | 2 refills | Status: DC | PRN

## 2024-05-19 MED ORDER — PREDNISONE 20 MG PO TABS: 40.0000 mg | ORAL_TABLET | Freq: Every day | ORAL | 0 refills | Status: AC

## 2024-05-19 MED ORDER — METHYLPREDNISOLONE SODIUM SUCC 125 MG IJ SOLR
125.0000 mg | Freq: Once | INTRAMUSCULAR | Status: AC
  Administered 2024-05-19: 125 mg via INTRAVENOUS
  Filled 2024-05-19: qty 2

## 2024-05-19 MED ORDER — IPRATROPIUM-ALBUTEROL 0.5-2.5 (3) MG/3ML IN SOLN
9.0000 mL | Freq: Once | RESPIRATORY_TRACT | Status: AC
  Administered 2024-05-19: 9 mL via RESPIRATORY_TRACT
  Filled 2024-05-19: qty 9

## 2024-05-19 MED ORDER — POTASSIUM CHLORIDE CRYS ER 20 MEQ PO TBCR
40.0000 meq | EXTENDED_RELEASE_TABLET | Freq: Once | ORAL | Status: AC
  Administered 2024-05-19: 40 meq via ORAL
  Filled 2024-05-19: qty 2

## 2024-05-19 MED ORDER — ALBUTEROL SULFATE (2.5 MG/3ML) 0.083% IN NEBU
5.0000 mg | INHALATION_SOLUTION | Freq: Once | RESPIRATORY_TRACT | Status: AC
  Administered 2024-05-19: 5 mg via RESPIRATORY_TRACT
  Filled 2024-05-19: qty 6

## 2024-05-19 NOTE — ED Provider Notes (Signed)
 [place] EMERGENCY DEPARTMENT AT [facility] Provider Note   CSN: [id number] Arrival date & time: 05/19/24  1139     Patient presents with: Shortness of Breath   Ethan [name]. is a 53 y.o. male.   53 year old male with history of asthma and tobacco use who presents emergency department with active cough, congestion, and shortness of breath.  Patient reports that yesterday started having the above symptoms.  Also has had a sensation of reflux in his upper abdomen and lower chest.  It is not exertional.  Denies any fevers.  No known sick contacts.  No history of intubations for his asthma.  Has been trying his inhaler without full relief.       Prior to Admission medications   Medication Sig Start Date End Date Taking? Authorizing Provider  allopurinol (ZYLOPRIM) 300 MG tablet Take 300 mg by mouth every morning. 01/24/24  Yes [provider]  amLODipine (NORVASC) 10 MG tablet Take 10 mg by mouth every morning.   Yes [provider]  Cholecalciferol 1.25 MG (50000 UT) TABS Take 1 tablet by mouth every morning.   Yes [provider]  diphenhydrAMINE (BENADRYL) 25 MG tablet Take 25 mg by mouth every 6 (six) hours as needed for allergies.   Yes [provider]  lisinopril  (ZESTRIL ) 20 MG tablet Take 20 mg by mouth every morning.   Yes [provider]  omeprazole (PRILOSEC) 40 MG capsule Take 40 mg by mouth every morning.   Yes [provider]  oxybutynin  (DITROPAN ) 5 MG tablet Take 1 tablet (5 mg total) by mouth every 8 (eight) hours as needed for bladder spasms. 04/05/24  Yes Penne Knee, MD  Phenyleph-Doxylamine-DM-APAP (NYQUIL SEVERE COLD/FLU) 5-6.25-10-325 MG CAPS Take 1-2 Capfuls by mouth daily as needed (for cold/flu symptoms).   Yes [provider]  pravastatin (PRAVACHOL) 40 MG tablet Take 40 mg by mouth every morning.   Yes [provider]  predniSONE  (DELTASONE ) 20 MG tablet Take 2 tablets  (40 mg total) by mouth daily for 4 days. 05/19/24 05/23/24 Yes Yolande Lamar BROCKS, MD  tadalafil  (CIALIS ) 20 MG tablet Take 1 tablet (20 mg total) by mouth as needed for erectile dysfunction (Max dose 1 tablet every 36 hours). TAKE ONE TABLET AT LEAST 60 MINUTES PRIOR TO SEXUAL ACTIVITY AS NEEDED. 04/30/24  Yes Vaillancourt, Samantha, PA-C  albuterol  (VENTOLIN  HFA) 108 (90 Base) MCG/ACT inhaler Inhale 2 puffs into the lungs every 4 (four) hours as needed for wheezing or shortness of breath. 05/19/24   Yolande Lamar BROCKS, MD    Allergies: Penicillins    Review of Systems  Updated Vital Signs BP (!) 145/95   Pulse 97   Temp 97.8 F (36.6 C) (Oral)   Resp (!) 22   Ht 6' 3 (1.905 m)   Wt 133.8 kg   SpO2 94%   BMI 36.87 kg/m   Physical Exam Vitals and nursing note reviewed.  Constitutional:      General: Ethan [name] is not in acute distress.    Appearance: Ethan [name] is well-developed.  HENT:     Head: Normocephalic and atraumatic.     Right Ear: External ear normal.     Left Ear: External ear normal.     Nose: Nose normal.   Eyes:     Extraocular Movements: Extraocular movements intact.     Conjunctiva/sclera: Conjunctivae normal.     Pupils: Pupils are equal, round, and reactive to light.    Cardiovascular:  Rate and Rhythm: Normal rate and regular rhythm.     Heart sounds: Normal heart sounds.  Pulmonary:     Effort: Pulmonary effort is normal. No respiratory distress.     Breath sounds: Wheezing present.     Comments: On room air speaking in full sentences.  Coughing occasionally during exam. Abdominal:     General: There is no distension.     Palpations: Abdomen is soft. There is no mass.     Tenderness: There is no abdominal tenderness. There is no guarding.   Musculoskeletal:     Cervical back: Normal range of motion and neck supple.     Right lower leg: No edema.     Left lower leg: No edema.   Skin:    General: Skin is warm and dry.   Neurological:     Mental Status: Ethan [name]  is alert. Mental status is at baseline.   Psychiatric:        Mood and Affect: Mood normal.        Behavior: Behavior normal.     (all labs ordered are listed, but only abnormal results are displayed) Labs Reviewed  BASIC METABOLIC PANEL WITH GFR - Abnormal; Notable for the following components:      Result Value   Potassium 3.3 (*)    Glucose, Bld 160 (*)    BUN 5 (*)    All other components within normal limits  CBC - Abnormal; Notable for the following components:   RBC 4.02 (*)    All other components within normal limits  RESP PANEL BY RT-PCR (RSV, FLU A&B, COVID)  [id number]  TROPONIN I (HIGH SENSITIVITY)    EKG: EKG Interpretation Date/Time:  Saturday May 19 2024 12:06:11 EDT Ventricular Rate:  100 PR Interval:  148 QRS Duration:  86 QT Interval:  332 QTC Calculation: 428 R Axis:   48  Text Interpretation: Normal sinus rhythm ST & T wave abnormality, consider inferior ischemia Abnormal ECG When compared with ECG of 03-Feb-2024 13:40, Nonspecific T wave abnormality now evident in Lateral leads Confirmed by Yolande Charleston [phone]) on 05/19/2024 12:47:26 PM  Radiology: [facility] Chest 2 View Result Date: 05/19/2024 CLINICAL DATA:  Shortness of breath. EXAM: CHEST - 2 VIEW COMPARISON:  Chest x-ray 11/24/2023. FINDINGS: The heart size and mediastinal contours are within normal limits. Both lungs are clear. No visible pleural effusions or pneumothorax. No acute osseous abnormality. IMPRESSION: No active cardiopulmonary disease. Electronically Signed   By: Gilmore GORMAN Molt M.D.   On: 05/19/2024 12:43     Procedures   Medications Ordered in the ED  methylPREDNISolone  sodium succinate (SOLU-MEDROL ) 125 mg/2 mL injection 125 mg (125 mg Intravenous Given 05/19/24 1301)  ipratropium-albuterol  (DUONEB) 0.5-2.5 (3) MG/3ML nebulizer solution 9 mL (9 mLs Nebulization Given 05/19/24 1356)  albuterol  (PROVENTIL ) (2.5 MG/3ML) 0.083% nebulizer solution 5 mg (5 mg Nebulization Given 05/19/24  1515)  potassium chloride  SA (KLOR-CON  M) CR tablet 40 mEq (40 mEq Oral Given 05/19/24 1516)                                    Medical Decision Making Amount and/or Complexity of Data Reviewed Labs: ordered. Radiology: ordered.  Risk Prescription drug management.   53 year old male with history of asthma and tobacco use who presents emergency department with active cough, congestion, and shortness of breath.   Initial Ddx:  Asthma exacerbation, COPD, pneumonia, URI, CHF, MI  MDM/Course:  Patient presents emergency department with URI symptoms and wheezing on exam.  Also is complaining of some reflux sensation has been going on since last night.  We treated for an asthma exacerbation with DuoNebs and steroids.  EKG and initial troponin are reassuring so given the duration of his symptoms do not feel that repeat is indicated.  Suspect that his symptoms are in fact from reflux.  Chest x-ray without pneumonia.  COVID and flu negative.  Upon re-evaluation was feeling better after the nebs.  Discharged home with prednisone  and inhaler refill.  Will have him follow-up with his primary doctor in several days for checkup.  This patient presents to the ED for concern of complaints listed in HPI, this involves an extensive number of treatment options, and is a complaint that carries with it a high risk of complications and morbidity. Disposition including potential need for admission considered.   Dispo: DC Home. Return precautions discussed including, but not limited to, those listed in the AVS. Allowed pt time to ask questions which were answered fully prior to dc.  Records reviewed Outpatient Clinic Notes The following labs were independently interpreted: Chemistry and show no acute abnormality I independently reviewed the following imaging with scope of interpretation limited to determining acute life threatening conditions related to emergency care: Chest x-ray and agree with the radiologist  interpretation with the following exceptions: none I personally reviewed and interpreted cardiac monitoring: normal sinus rhythm  I personally reviewed and interpreted the pt's EKG: see above for interpretation  I have reviewed the patients home medications and made adjustments as needed  Portions of this note were generated with Dragon dictation software. Dictation errors may occur despite best attempts at proofreading.     Final diagnoses:  Moderate asthma with exacerbation, unspecified whether persistent    ED Discharge Orders          Ordered    albuterol  (VENTOLIN  HFA) 108 (90 Base) MCG/ACT inhaler  Every 4 hours PRN        05/19/24 1530    predniSONE  (DELTASONE ) 20 MG tablet  Daily        05/19/24 1530               Yolande Lamar BROCKS, MD 05/19/24 1926

## 2024-05-19 NOTE — Discharge Instructions (Signed)
 You were seen for your asthma exacerbation in the emergency department.   At home, please use your inhalers for any wheezing.  Please take the steroids we have prescribed you for your asthma exacerbation as well.    Check your MyChart online for the results of any tests that had not resulted by the time you left the emergency department.   Follow-up with your primary doctor in 2-3 days regarding your visit.    Return immediately to the emergency department if you experience any of the following: Difficulty breathing, chest pain, or any other concerning symptoms.    Thank you for visiting our Emergency Department. It was a pleasure taking care of you today.

## 2024-05-19 NOTE — ED Triage Notes (Signed)
 Started having problems with shortness of breath and acid reflux, started yesterday. Has used his Proventil  inhaler this morning with a little relief. Describes some tightness in his chest.

## 2024-05-24 ENCOUNTER — Other Ambulatory Visit: Payer: Self-pay | Admitting: Physician Assistant

## 2024-05-24 DIAGNOSIS — N529 Male erectile dysfunction, unspecified: Secondary | ICD-10-CM

## 2024-05-29 ENCOUNTER — Ambulatory Visit: Payer: MEDICAID | Admitting: Physician Assistant

## 2024-06-06 ENCOUNTER — Ambulatory Visit (INDEPENDENT_AMBULATORY_CARE_PROVIDER_SITE_OTHER): Payer: MEDICAID | Admitting: Physician Assistant

## 2024-06-06 ENCOUNTER — Encounter: Payer: Self-pay | Admitting: Physician Assistant

## 2024-06-06 VITALS — BP 134/84 | HR 111 | Ht 75.0 in | Wt 319.0 lb

## 2024-06-06 DIAGNOSIS — N529 Male erectile dysfunction, unspecified: Secondary | ICD-10-CM

## 2024-06-06 DIAGNOSIS — L819 Disorder of pigmentation, unspecified: Secondary | ICD-10-CM

## 2024-06-06 MED ORDER — TADALAFIL 20 MG PO TABS
20.0000 mg | ORAL_TABLET | Freq: Every day | ORAL | 5 refills | Status: DC | PRN
Start: 2024-06-06 — End: 2024-10-08

## 2024-06-06 NOTE — Progress Notes (Signed)
 06/06/2024 5:29 PM   Ethan [name]. 19-Jan-1971 [id number]  CC: Chief Complaint  Patient presents with   Follow-up   HPI: Ethan [name]. is a 53 y.o. male with PMH fossa navicularis stricture and microscopic hematuria s/p Optilume dilation and cystoscopy with Dr. Penne in May and ED who presents today for follow-up on tadalafil  20 mg on demand.   Today he reports significant improvement in his erections on tadalafil .  He is tolerating it well and is pleased, desiring to continue it.  No priapism.  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  He has not yet been contacted to schedule dermatology follow-up.  Shim 14 as below.   SHIM     Row Name 06/06/24 1424         SHIM: Over the last 6 months:   How do you rate your confidence that you could get and keep an erection? Very Low     When you had erections with sexual stimulation, how often were your erections hard enough for penetration (entering your partner)? A Few Times (much less than half the time)     During sexual intercourse, how often were you able to maintain your erection after you had penetrated (entered) your partner? Almost Never or Never     During sexual intercourse, how difficult was it to maintain your erection to completion of intercourse? Not Difficult     When you attempted sexual intercourse, how often was it satisfactory for you? Almost Always or Always       SHIM Total Score   SHIM 14        SHIM     Row Name 06/06/24 1424         SHIM: Over the last 6 months:   How do you rate your confidence that you could get and keep an erection? Very Low     When you had erections with sexual stimulation, how often were your erections hard enough for penetration (entering your partner)? A Few Times (much less than half the time)     During sexual intercourse, how often were you able to maintain your erection  after you had penetrated (entered) your partner? Almost Never or Never     During sexual intercourse, how difficult was it to maintain your erection to completion of intercourse? Not Difficult     When you attempted sexual intercourse, how often was it satisfactory for you? Almost Always or Always       SHIM Total Score   SHIM 14         PMH: Past Medical History:  Diagnosis Date   Asthma    Cellulitis    hospitalized 07/2013   Diverticulitis    06/2012   Diverticulitis    GERD (gastroesophageal reflux disease)    Gout    History of cellulitis    Hypercholesterolemia    Hypertension    Hypokalemia    Microscopic hematuria    Pneumonia    Tobacco use    Urethral stricture     Surgical History: Past Surgical History:  Procedure Laterality Date   ARM WOUND REPAIR / CLOSURE     COLONOSCOPY WITH PROPOFOL  N/A 12/12/2023   Procedure: COLONOSCOPY WITH PROPOFOL ;  Surgeon: Shaaron Lamar HERO, MD;  Location: [facility];  Service: Endoscopy;  Laterality: N/A;  7:30 am, asa 2   CYSTOSCOPY WITH URETHRAL DILATATION N/A 04/05/2024  Procedure: CYSTOSCOPY, WITH URETHRAL DILATION;  Surgeon: Penne Knee, MD;  Location: [facility];  Service: Urology;  Laterality: N/A;  BALLOON DILATION USING OPTILUME   ESOPHAGOGASTRODUODENOSCOPY N/A 11/14/2013   MFM:Ypjujo hernia; otherwise negative examination   WRIST ARTHROCENTESIS     WRIST SURGERY  12/2013   right    Home Medications:  Allergies as of 06/06/2024       Reactions   Penicillins Rash        Medication List        Accurate as of June 06, 2024  5:29 PM. If you have any questions, ask your nurse or doctor.          albuterol  108 (90 Base) MCG/ACT inhaler Commonly known as: VENTOLIN  HFA Inhale 2 puffs into the lungs every 4 (four) hours as needed for wheezing or shortness of breath.   allopurinol 300 MG tablet Commonly known as: ZYLOPRIM Take 300 mg by mouth every morning.   amLODipine 10 MG tablet Commonly known as:  NORVASC Take 10 mg by mouth every morning.   Cholecalciferol 1.25 MG (50000 UT) Tabs Take 1 tablet by mouth every morning.   diphenhydrAMINE 25 MG tablet Commonly known as: BENADRYL Take 25 mg by mouth every 6 (six) hours as needed for allergies.   lisinopril  20 MG tablet Commonly known as: ZESTRIL  Take 20 mg by mouth every morning.   NyQuil Severe Cold/Flu 5-6.25-10-325 MG Caps Generic drug: Phenyleph-Doxylamine-DM-APAP Take 1-2 Capfuls by mouth daily as needed (for cold/flu symptoms).   omeprazole 40 MG capsule Commonly known as: PRILOSEC Take 40 mg by mouth every morning.   oxybutynin  5 MG tablet Commonly known as: DITROPAN  Take 1 tablet (5 mg total) by mouth every 8 (eight) hours as needed for bladder spasms.   pravastatin 40 MG tablet Commonly known as: PRAVACHOL Take 40 mg by mouth every morning.   tadalafil  20 MG tablet Commonly known as: CIALIS  Take 1 tablet (20 mg total) by mouth daily as needed for erectile dysfunction. What changed: See the new instructions. Changed by: Tyvon Eggenberger        Allergies:  Allergies  Allergen Reactions   Penicillins Rash    Family History: Family History  Problem Relation Age of Onset   Colon cancer Neg Hx     Social History:   reports that he has been smoking cigarettes. He started smoking about 35 years ago. He has a 17.8 pack-year smoking history. He has never used smokeless tobacco. He reports current alcohol use of about 1.0 standard drink of alcohol per week. He reports that he does not use drugs.  Physical Exam: BP 134/84   Pulse (!) 111   Ht 6' 3 (1.905 m)   Wt (!) 319 lb (144.7 kg)   BMI 39.87 kg/m   Constitutional:  Alert and oriented, no acute distress, nontoxic appearing HEENT: [place], AT Cardiovascular: No clubbing, cyanosis, or edema Respiratory: Normal respiratory effort, no increased work of breathing Skin: No rashes, bruises or suspicious lesions Neurologic: Grossly intact, no focal  deficits, moving all 4 extremities Psychiatric: Normal mood and affect  Assessment & Plan:   1. Erectile dysfunction, unspecified erectile dysfunction type (Primary) Well-managed on demand dose tadalafil , will continue this. - tadalafil  (CIALIS ) 20 MG tablet; Take 1 tablet (20 mg total) by mouth daily as needed for erectile dysfunction.  Dispense: 30 tablet; Refill: 5  2. Hypopigmentation I gave him the phone number for scheduling for the dermatology office for further evaluation of possible BXO.  Return  in about 1 year (around 06/06/2025) for Annual IPS/PVR.  Lucie Hones, PA-C  [facility] [place] [address] [place], [place] [address] [phone]

## 2024-06-06 NOTE — Patient Instructions (Signed)
 [facility] : [phone]

## 2024-06-14 ENCOUNTER — Ambulatory Visit: Payer: Self-pay

## 2024-06-26 ENCOUNTER — Telehealth: Payer: Self-pay | Admitting: Physician Assistant

## 2024-06-26 NOTE — Telephone Encounter (Signed)
 Pt said he has no refills left of Cialis .  He went by pharmacy and they told him he didn't have any, but it looks like he just had this refilled.

## 2024-06-27 NOTE — Telephone Encounter (Signed)
 Left detailed VM, per DPR, informing pt that we sent enough refills for the rest of the year. He would have to wait another week to pick a new Rx.

## 2024-07-26 ENCOUNTER — Ambulatory Visit: Payer: Self-pay | Admitting: Nurse Practitioner

## 2024-08-04 ENCOUNTER — Other Ambulatory Visit: Payer: Self-pay

## 2024-08-04 ENCOUNTER — Emergency Department (HOSPITAL_COMMUNITY): Payer: MEDICAID

## 2024-08-04 ENCOUNTER — Emergency Department (HOSPITAL_COMMUNITY)
Admission: EM | Admit: 2024-08-04 | Discharge: 2024-08-04 | Disposition: A | Discharge status: Home / Self Care | Payer: MEDICAID | Attending: Emergency Medicine | Admitting: Emergency Medicine

## 2024-08-04 DIAGNOSIS — E871 Hypo-osmolality and hyponatremia: Secondary | ICD-10-CM | POA: Insufficient documentation

## 2024-08-04 DIAGNOSIS — K8500 Idiopathic acute pancreatitis without necrosis or infection: Secondary | ICD-10-CM | POA: Diagnosis not present

## 2024-08-04 DIAGNOSIS — R1013 Epigastric pain: Secondary | ICD-10-CM | POA: Diagnosis present

## 2024-08-04 LAB — COMPREHENSIVE METABOLIC PANEL WITH GFR
ALT: 186 U/L — ABNORMAL HIGH (ref 0–44)
AST: 367 U/L — ABNORMAL HIGH (ref 15–41)
Albumin: 3.3 g/dL — ABNORMAL LOW (ref 3.5–5.0)
Alkaline Phosphatase: 131 U/L — ABNORMAL HIGH (ref 38–126)
Anion gap: 17 — ABNORMAL HIGH (ref 5–15)
BUN: 12 mg/dL (ref 6–20)
CO2: 22 mmol/L (ref 22–32)
Calcium: 8.9 mg/dL (ref 8.9–10.3)
Chloride: 94 mmol/L — ABNORMAL LOW (ref 98–111)
Creatinine, Ser: 1.05 mg/dL (ref 0.61–1.24)
GFR, Estimated: >60 mL/min (ref >60)
Glucose, Bld: 149 mg/dL — ABNORMAL HIGH (ref 70–99)
Potassium: 3.5 mmol/L (ref 3.5–5.1)
Sodium: 133 mmol/L — ABNORMAL LOW (ref 135–145)
Total Bilirubin: 3 mg/dL — ABNORMAL HIGH (ref 0.0–1.2)
Total Protein: 7.2 g/dL (ref 6.5–8.1)

## 2024-08-04 LAB — URINALYSIS, ROUTINE W REFLEX MICROSCOPIC
Bacteria, UA: NONE SEEN
Bilirubin Urine: NEGATIVE
Glucose, UA: >=500 mg/dL — AB
Ketones, ur: NEGATIVE mg/dL
Leukocytes,Ua: NEGATIVE
Nitrite: NEGATIVE
Protein, ur: 30 mg/dL — AB
Specific Gravity, Urine: 1.025 (ref 1.005–1.030)
pH: 5 (ref 5.0–8.0)

## 2024-08-04 LAB — CBC WITH DIFFERENTIAL/PLATELET
Abs Immature Granulocytes: 0.03 K/uL (ref 0.00–0.07)
Basophils Absolute: 0 K/uL (ref 0.0–0.1)
Basophils Relative: 0 %
Eosinophils Absolute: 0.1 K/uL (ref 0.0–0.5)
Eosinophils Relative: 1 %
HCT: 45 % (ref 39.0–52.0)
Hemoglobin: 15.5 g/dL (ref 13.0–17.0)
Immature Granulocytes: 0 %
Lymphocytes Relative: 21 %
Lymphs Abs: 1.8 K/uL (ref 0.7–4.0)
MCH: 33.2 pg (ref 26.0–34.0)
MCHC: 34.4 g/dL (ref 30.0–36.0)
MCV: 96.4 fL (ref 80.0–100.0)
Monocytes Absolute: 0.6 K/uL (ref 0.1–1.0)
Monocytes Relative: 7 %
Neutro Abs: 5.7 K/uL (ref 1.7–7.7)
Neutrophils Relative %: 71 %
Platelets: 201 K/uL (ref 150–400)
RBC: 4.67 MIL/uL (ref 4.22–5.81)
RDW: 12.7 % (ref 11.5–15.5)
WBC: 8.2 K/uL (ref 4.0–10.5)
nRBC: 0 % (ref 0.0–0.2)

## 2024-08-04 LAB — ETHANOL: Alcohol, Ethyl (B): <15 mg/dL (ref <15)

## 2024-08-04 LAB — LIPASE, BLOOD: Lipase: 64 U/L — ABNORMAL HIGH (ref 11–51)

## 2024-08-04 MED ORDER — HYDROMORPHONE HCL 1 MG/ML IJ SOLN
0.5000 mg | Freq: Once | INTRAMUSCULAR | Status: AC
  Administered 2024-08-04: 0.5 mg via INTRAVENOUS
  Filled 2024-08-04: qty 0.5

## 2024-08-04 MED ORDER — ONDANSETRON HCL 4 MG/2ML IJ SOLN
4.0000 mg | Freq: Once | INTRAMUSCULAR | Status: AC
  Administered 2024-08-04: 4 mg via INTRAVENOUS
  Filled 2024-08-04: qty 2

## 2024-08-04 MED ORDER — SODIUM CHLORIDE 0.9 % IV BOLUS
1000.0000 mL | Freq: Once | INTRAVENOUS | Status: AC
  Administered 2024-08-04: 1000 mL via INTRAVENOUS

## 2024-08-04 MED ORDER — OXYCODONE-ACETAMINOPHEN 5-325 MG PO TABS: 1.0000 | ORAL_TABLET | Freq: Four times a day (QID) | ORAL | 0 refills | Status: DC | PRN

## 2024-08-04 MED ORDER — PANTOPRAZOLE SODIUM 40 MG IV SOLR
40.0000 mg | Freq: Once | INTRAVENOUS | Status: AC
  Administered 2024-08-04: 40 mg via INTRAVENOUS
  Filled 2024-08-04: qty 10

## 2024-08-04 MED ORDER — IOHEXOL 300 MG/ML  SOLN
100.0000 mL | Freq: Once | INTRAMUSCULAR | Status: AC | PRN
  Administered 2024-08-04: 100 mL via INTRAVENOUS

## 2024-08-04 MED ORDER — ONDANSETRON 4 MG PO TBDP
ORAL_TABLET | ORAL | 0 refills | Status: DC
Start: 2024-08-04 — End: 2024-08-15

## 2024-08-04 NOTE — ED Notes (Signed)
 Pt also wants to be checked for STD's but denies any symptoms.

## 2024-08-04 NOTE — Discharge Instructions (Addendum)
 Take liquids only for the next 2 days.  Follow-up with Dr. Cindie or one of his colleagues at the beginning of next week.  Return if getting worse.  Do not drink any alcohol

## 2024-08-04 NOTE — ED Provider Notes (Signed)
 [place] [facility] AT [facility] Provider Note   CSN: [id number] Arrival date & time: 08/04/24  9275     Patient presents with: Abdominal Pain   Ethan [name]. is a 53 y.o. male.   Patient complains of epigastric pain.  he does have a history of alcohol use  The history is provided by the patient and medical records. No language interpreter was used.  Abdominal Pain Pain location:  Epigastric Pain quality: aching   Pain radiates to:  Does not radiate Pain severity:  Moderate Onset quality:  Sudden Timing:  Constant Progression:  Worsening Associated symptoms: no chest pain, no cough, no diarrhea, no fatigue and no hematuria        Prior to Admission medications   Medication Sig Start Date End Date Taking? Authorizing Provider  ondansetron  (ZOFRAN -ODT) 4 MG disintegrating tablet 4mg  ODT q4 hours prn nausea/vomit 08/04/24  Yes Amaury Kuzel, MD  oxyCODONE -acetaminophen  (PERCOCET/ROXICET) 5-325 MG tablet Take 1 tablet by mouth every 6 (six) hours as needed for severe pain (pain score 7-10). 08/04/24  Yes Suzette Pac, MD  albuterol  (VENTOLIN  HFA) 108 (90 Base) MCG/ACT inhaler Inhale 2 puffs into the lungs every 4 (four) hours as needed for wheezing or shortness of breath. 05/19/24   Yolande Lamar BROCKS, MD  allopurinol  (ZYLOPRIM ) 300 MG tablet Take 300 mg by mouth every morning. 01/24/24   [provider]  amLODipine  (NORVASC ) 10 MG tablet Take 10 mg by mouth every morning.    [provider]  Cholecalciferol 1.25 MG (50000 UT) TABS Take 1 tablet by mouth every morning.    [provider]  diphenhydrAMINE (BENADRYL) 25 MG tablet Take 25 mg by mouth every 6 (six) hours as needed for allergies.    [provider]  lisinopril  (ZESTRIL ) 20 MG tablet Take 20 mg by mouth every morning.    [provider]  omeprazole (PRILOSEC) 40 MG capsule Take 40 mg by mouth every morning.    [provider]  oxybutynin   (DITROPAN ) 5 MG tablet Take 1 tablet (5 mg total) by mouth every 8 (eight) hours as needed for bladder spasms. 04/05/24   Penne Knee, MD  Phenyleph-Doxylamine-DM-APAP (NYQUIL SEVERE COLD/FLU) 5-6.25-10-325 MG CAPS Take 1-2 Capfuls by mouth daily as needed (for cold/flu symptoms).    [provider]  pravastatin  (PRAVACHOL ) 40 MG tablet Take 40 mg by mouth every morning.    [provider]  tadalafil  (CIALIS ) 20 MG tablet Take 1 tablet (20 mg total) by mouth daily as needed for erectile dysfunction. 06/06/24   Vaillancourt, Samantha, PA-C    Allergies: Penicillins    Review of Systems  Constitutional:  Negative for appetite change and fatigue.  HENT:  Negative for congestion, ear discharge and sinus pressure.   Eyes:  Negative for discharge.  Respiratory:  Negative for cough.   Cardiovascular:  Negative for chest pain.  Gastrointestinal:  Positive for abdominal pain. Negative for diarrhea.  Genitourinary:  Negative for frequency and hematuria.  Musculoskeletal:  Negative for back pain.  Skin:  Negative for rash.  Neurological:  Negative for seizures and headaches.  Psychiatric/Behavioral:  Negative for hallucinations.     Updated Vital Signs BP 129/84 (BP Location: Left Arm)   Pulse (!) 117   Temp 98.2 F (36.8 C) (Oral)   Resp 18   Ht 6' 3 (1.905 m)   Wt 131.5 kg   SpO2 97%   BMI 36.25 kg/m   Physical Exam Vitals and nursing  note reviewed.  Constitutional:      Appearance: He is well-developed.  HENT:     Head: Normocephalic.     Nose: Nose normal.  Eyes:     General: No scleral icterus.    Conjunctiva/sclera: Conjunctivae normal.  Neck:     Thyroid: No thyromegaly.  Cardiovascular:     Rate and Rhythm: Normal rate and regular rhythm.     Heart sounds: No murmur heard.    No friction rub. No gallop.  Pulmonary:     Breath sounds: No stridor. No wheezing or rales.  Chest:     Chest wall: No tenderness.  Abdominal:     General: There is no  distension.     Tenderness: There is no abdominal tenderness. There is no rebound.  Musculoskeletal:        General: Normal range of motion.     Cervical back: Neck supple.  Lymphadenopathy:     Cervical: No cervical adenopathy.  Skin:    Findings: No erythema or rash.  Neurological:     Mental Status: He is alert and oriented to person, place, and time.     Motor: No abnormal muscle tone.     Coordination: Coordination normal.  Psychiatric:        Behavior: Behavior normal.     (all labs ordered are listed, but only abnormal results are displayed) Labs Reviewed  COMPREHENSIVE METABOLIC PANEL WITH GFR - Abnormal; Notable for the following components:      Result Value   Sodium 133 (*)    Chloride 94 (*)    Glucose, Bld 149 (*)    Albumin 3.3 (*)    AST 367 (*)    ALT 186 (*)    Alkaline Phosphatase 131 (*)    Total Bilirubin 3.0 (*)    Anion gap 17 (*)    All other components within normal limits  LIPASE, BLOOD - Abnormal; Notable for the following components:   Lipase 64 (*)    All other components within normal limits  URINALYSIS, ROUTINE W REFLEX MICROSCOPIC - Abnormal; Notable for the following components:   Color, Urine AMBER (*)    Glucose, UA >=500 (*)    Hgb urine dipstick MODERATE (*)    Protein, ur 30 (*)    All other components within normal limits  CBC WITH DIFFERENTIAL/PLATELET  ETHANOL    EKG: None  Radiology: CT ABDOMEN PELVIS W CONTRAST Result Date: 08/04/2024 CLINICAL DATA:  Abdominal pain, acute, nonlocalized. Vomiting and coughing. EXAM: CT ABDOMEN AND PELVIS WITH CONTRAST TECHNIQUE: Multidetector CT imaging of the abdomen and pelvis was performed using the standard protocol following bolus administration of intravenous contrast. RADIATION DOSE REDUCTION: This exam was performed according to the departmental dose-optimization program which includes automated exposure control, adjustment of the mA and/or kV according to patient size and/or use of  iterative reconstruction technique. CONTRAST:  OMNIPAQUE  IOHEXOL  300 MG/ML  SOLN COMPARISON:  12/13/2023 FINDINGS: Lower chest: Subtle patchy densities in the right middle lobe. Slightly limited evaluation of the right middle lobe due to motion artifact. 5 mm nodular density in the right middle lobe on image 6/3. Hepatobiliary: Diffuse low-density in the liver is suggestive for hepatic steatosis. Normal appearance of the gallbladder. No biliary dilatation. Pancreas: Fat stranding around the duodenum and pancreatic head. No evidence for pancreatic duct dilatation. Mild heterogeneity in the pancreatic head on image 44/2. Findings are concerning for acute pancreatitis. Spleen: Normal in size without focal abnormality. Adrenals/Urinary Tract: Normal adrenal  glands. Small hypodensities in the right kidney that are too small to definitively characterize. No suspicious renal lesions. Negative for hydronephrosis. Normal urinary bladder. Stomach/Bowel: Probable small hiatal hernia. Otherwise, normal appearance of the stomach. Question mild wall thickening in the duodenum near the pancreatic head. No small bowel dilatation. Scattered colonic diverticula without acute inflammation. Normal appendix. No evidence for bowel obstruction. Vascular/Lymphatic: Aortic atherosclerosis. No enlarged abdominal or pelvic lymph nodes. Reproductive: Prostate is unremarkable. Other: Negative for free fluid. Negative for free air. Umbilical hernia containing fat. Musculoskeletal: Degenerative facet disease in lumbar spine. No acute bone abnormality. IMPRESSION: 1. Inflammatory changes around the pancreatic head and duodenum. Findings are concerning for acute pancreatitis. 2. Hepatic steatosis. 3. Subtle patchy densities in the right middle lobe. Findings are nonspecific but could be related to an infectious or inflammatory process. 4. 5 mm right solid pulmonary nodule. No follow-up needed if patient is low-risk. Non-contrast chest CT can  be considered in 12 months if patient is high-risk. This recommendation follows the consensus statement: Guidelines for Management of Incidental Pulmonary Nodules Detected on CT Images: From the Fleischner Society 2017; Radiology 2017; 284:228-243. 5. Aortic Atherosclerosis (ICD10-I70.0). Electronically Signed   By: Juliene Balder M.D.   On: 08/04/2024 10:08     Procedures   Medications Ordered in the ED  sodium chloride  0.9 % bolus 1,000 mL (1,000 mLs Intravenous New Bag/Given 08/04/24 0807)  ondansetron  (ZOFRAN ) injection 4 mg (4 mg Intravenous Given 08/04/24 0803)  pantoprazole  (PROTONIX ) injection 40 mg (40 mg Intravenous Given 08/04/24 0803)  HYDROmorphone  (DILAUDID ) injection 0.5 mg (0.5 mg Intravenous Given 08/04/24 0803)  iohexol  (OMNIPAQUE ) 300 MG/ML solution 100 mL (100 mLs Intravenous Contrast Given 08/04/24 0932)                                    Medical Decision Making Amount and/or Complexity of Data Reviewed Labs: ordered. Radiology: ordered.  Risk Prescription drug management.   Patient with pancreatitis.  And also elevated liver enzymes.  Patient was offered admission.  Patient prefers to be treated at home.  He is sent home with Zofran  and Percocets and will follow-up with GI     Final diagnoses:  Idiopathic acute pancreatitis without infection or necrosis    ED Discharge Orders          Ordered    ondansetron  (ZOFRAN -ODT) 4 MG disintegrating tablet        08/04/24 1113    oxyCODONE -acetaminophen  (PERCOCET/ROXICET) 5-325 MG tablet  Every 6 hours PRN        08/04/24 1113               Suzette Pac, MD 08/04/24 1710

## 2024-08-04 NOTE — ED Notes (Signed)
 Patient transported to CT

## 2024-08-04 NOTE — ED Triage Notes (Signed)
 Pt arrived POV with c/o abd [ain since last night, pt c/o some diarrhea , denies n/v or fever.

## 2024-08-06 LAB — GC/CHLAMYDIA PROBE AMP (~~LOC~~) NOT AT ARMC
Chlamydia: NEGATIVE
Comment: NEGATIVE
Comment: NORMAL
Neisseria Gonorrhea: NEGATIVE

## 2024-08-09 ENCOUNTER — Ambulatory Visit (INDEPENDENT_AMBULATORY_CARE_PROVIDER_SITE_OTHER): Payer: MEDICAID

## 2024-08-09 ENCOUNTER — Encounter: Payer: Self-pay | Admitting: Emergency Medicine

## 2024-08-09 ENCOUNTER — Ambulatory Visit
Admission: EM | Admit: 2024-08-09 | Discharge: 2024-08-09 | Disposition: A | Discharge status: Home / Self Care | Payer: MEDICAID | Attending: Family Medicine | Admitting: Family Medicine

## 2024-08-09 ENCOUNTER — Other Ambulatory Visit: Payer: Self-pay

## 2024-08-09 DIAGNOSIS — M25561 Pain in right knee: Secondary | ICD-10-CM

## 2024-08-09 DIAGNOSIS — G8929 Other chronic pain: Secondary | ICD-10-CM

## 2024-08-09 MED ORDER — METHYLPREDNISOLONE 4 MG PO TBPK
ORAL_TABLET | ORAL | 0 refills | Status: DC
Start: 2024-08-09 — End: 2024-08-15

## 2024-08-09 NOTE — ED Triage Notes (Signed)
 Pt reports right knee pain x3 months. Pt denies any known injury and reports I thought it was arthritis. Has tried braces, creams with no change in discomfort. Pt reports pain with standing and moving but denies discomfort at rest usually but states even that hurts now.

## 2024-08-09 NOTE — ED Provider Notes (Signed)
 [facility]   [id number] 08/09/24 Arrival Time: 9080  ASSESSMENT & PLAN:  1. Chronic pain of right knee    I have personally viewed and independently interpreted the imaging studies ordered this visit. R knee 2V: arthritic changes; nothing acute; no lesions.  Trial of: New Prescriptions   METHYLPREDNISOLONE  (MEDROL  DOSEPAK) 4 MG TBPK TABLET    Take as directed.    Orders Placed This Encounter  Procedures   DG Knee 2 Views Right    Recommend:  Follow-up Information     Schedule an appointment as soon as possible for a visit  with [facility].   Why: Or go to their walk in clinic. Contact information: [address] [place] [address] [phone]                Reviewed expectations re: course of current medical issues. Questions answered. Outlined signs and symptoms indicating need for more acute intervention. Patient verbalized understanding. After Visit Summary given.  SUBJECTIVE: History from: patient. Ethan [name]. is a 53 y.o. male who reports non-traumatic R knee pain; x 3 months; worse with weight bearing. Denies extremity sensation changes or weakness.  Occas ibup with only mild help.  Past Surgical History:  Procedure Laterality Date   ARM WOUND REPAIR / CLOSURE     COLONOSCOPY WITH PROPOFOL  N/A 12/12/2023   Procedure: COLONOSCOPY WITH PROPOFOL ;  Surgeon: Shaaron Lamar HERO, MD;  [facility];  Service: Endoscopy;  Laterality: N/A;  7:30 am, asa 2   CYSTOSCOPY WITH URETHRAL DILATATION N/A 04/05/2024   Procedure: CYSTOSCOPY, WITH URETHRAL DILATION;  Surgeon: Penne Knee, MD;  Location: [facility];  Service: Urology;  Laterality: N/A;  BALLOON DILATION USING OPTILUME   ESOPHAGOGASTRODUODENOSCOPY N/A 11/14/2013   MFM:Ypjujo hernia; otherwise negative examination   WRIST ARTHROCENTESIS     WRIST SURGERY  12/2013   right      OBJECTIVE:  Vitals:   08/09/24 1049  BP: (!) 155/80  Pulse: 87  Resp: 18   Temp: 98.3 F (36.8 C)  TempSrc: Oral  SpO2: 92%    General appearance: alert; no distress HEENT: [place]; AT Neck: supple with FROM Resp: unlabored respirations Extremities: RLE: warm with well perfused appearance; FROM; mild soreness with palpation of ant knee CV: brisk extremity capillary refill of RLE Skin: warm and dry; no visible rashes Neurologic: gait normal; normal sensation and strength of RLE Psychological: alert and cooperative; normal mood and affect  Imaging: DG Knee 2 Views Right Result Date: 08/09/2024 EXAM: 1 or 2 VIEW(S) XRAY OF THE KNEE 08/09/2024 11:02:08 AM COMPARISON: None available. CLINICAL HISTORY: Non-traumatic knee pain x 3 months. FINDINGS: BONES AND JOINTS: No acute fracture. No focal osseous lesion. No joint dislocation. No significant joint effusion. Mild medial compartment joint space loss consistent with joint degeneration. SOFT TISSUES: Calcified peripheral vascular disease. IMPRESSION: 1. Mild medial compartment joint space loss consistent with joint degeneration. Electronically signed by: Donnice Mania MD 08/09/2024 11:14 AM EDT RP Workstation: [id number]      Allergies  Allergen Reactions   Penicillins Rash    Past Medical History:  Diagnosis Date   Asthma    Cellulitis    hospitalized 07/2013   Diverticulitis    06/2012   Diverticulitis    GERD (gastroesophageal reflux disease)    Gout    History of cellulitis    Hypercholesterolemia    Hypertension    Hypokalemia    Microscopic hematuria    Pneumonia  Tobacco use    Urethral stricture    Social History   Socioeconomic History   Marital status: Single    Spouse name: Not on file   Number of children: 3   Years of education: Not on file   Highest education level: Not on file  Occupational History   Not on file  Tobacco Use   Smoking status: Every Day    Current packs/day: 0.50    Average packs/day: 0.5 packs/day for 35.7 years (17.8 ttl pk-yrs)    Types: Cigarettes     Start date: 33   Smokeless tobacco: Never   Tobacco comments:    1/2 pack daily  Vaping Use   Vaping status: Never Used  Substance and Sexual Activity   Alcohol use: Yes    Alcohol/week: 1.0 standard drink of alcohol    Types: 1 Cans of beer per week    Comment: heavy use on weekends   Drug use: Never   Sexual activity: Not Currently    Birth control/protection: None  Other Topics Concern   Not on file  Social History Narrative   ** Merged History Encounter **       Social Drivers of Health   Financial Resource Strain: Low Risk  (06/19/2024)   Received from [facility]   Overall Financial Resource Strain (CARDIA)    How hard is it for you to pay for the very basics like food, housing, medical care, and heating?: Not hard at all  Food Insecurity: No Food Insecurity (06/19/2024)   Received from [facility]   Hunger Vital Sign    Within the past 12 months, you worried that your food would run out before you got the money to buy more.: Never true    Within the past 12 months, the food you bought just didn't last and you didn't have money to get more.: Never true  Transportation Needs: No Transportation Needs (06/19/2024)   Received from [facility]   PRAPARE - Transportation    Lack of Transportation (Medical): No    Lack of Transportation (Non-Medical): No  Physical Activity: Insufficiently Active (06/19/2024)   Received from [facility]   Exercise Vital Sign    On average, how many days per week do you engage in moderate to strenuous exercise (like a brisk walk)?: 2 days    On average, how many minutes do you engage in exercise at this level?: 10 min  Stress: No Stress Concern Present (06/19/2024)   Received from [facility] - Occupational Stress Questionnaire    Do you feel stress - tense, restless, nervous, or anxious, or unable to sleep at night because your mind is troubled all the time - these days?: Only a  little  Social Connections: Moderately Integrated (06/19/2024)   Received from [facility]   Social Connection and Isolation Panel    In a typical week, how many times do you talk on the phone with family, friends, or neighbors?: More than three times a week    How often do you get together with friends or relatives?: More than three times a week    How often do you attend church or religious services?: More than 4 times per year    Do you belong to any clubs or organizations such as church groups, unions, fraternal or athletic groups, or school groups?: Yes    How often do you attend meetings of the clubs  or organizations you belong to?: More than 4 times per year    Marital Status: Divorced   Family History  Problem Relation Age of Onset   Colon cancer Neg Hx    Past Surgical History:  Procedure Laterality Date   ARM WOUND REPAIR / CLOSURE     COLONOSCOPY WITH PROPOFOL  N/A 12/12/2023   Procedure: COLONOSCOPY WITH PROPOFOL ;  Surgeon: Shaaron Lamar HERO, MD;  [facility];  Service: Endoscopy;  Laterality: N/A;  7:30 am, asa 2   CYSTOSCOPY WITH URETHRAL DILATATION N/A 04/05/2024   Procedure: CYSTOSCOPY, WITH URETHRAL DILATION;  Surgeon: Penne Knee, MD;  Location: [facility] ORS;  Service: Urology;  Laterality: N/A;  BALLOON DILATION USING OPTILUME   ESOPHAGOGASTRODUODENOSCOPY N/A 11/14/2013   MFM:Ypjujo hernia; otherwise negative examination   WRIST ARTHROCENTESIS     WRIST SURGERY  12/2013   right       Rolinda Rogue, MD 08/09/24 1127

## 2024-08-12 ENCOUNTER — Encounter (HOSPITAL_COMMUNITY): Payer: Self-pay | Admitting: *Deleted

## 2024-08-12 ENCOUNTER — Other Ambulatory Visit: Payer: Self-pay

## 2024-08-12 ENCOUNTER — Emergency Department (HOSPITAL_COMMUNITY): Payer: MEDICAID

## 2024-08-12 ENCOUNTER — Inpatient Hospital Stay (HOSPITAL_COMMUNITY)
Admission: EM | Admit: 2024-08-12 | Discharge: 2024-08-15 | DRG: 392 | Disposition: A | Discharge status: Home / Self Care | Payer: MEDICAID | Attending: Family Medicine | Admitting: Family Medicine

## 2024-08-12 DIAGNOSIS — E871 Hypo-osmolality and hyponatremia: Secondary | ICD-10-CM | POA: Diagnosis present

## 2024-08-12 DIAGNOSIS — E7800 Pure hypercholesterolemia, unspecified: Secondary | ICD-10-CM | POA: Diagnosis present

## 2024-08-12 DIAGNOSIS — Z880 Allergy status to penicillin: Secondary | ICD-10-CM

## 2024-08-12 DIAGNOSIS — I10 Essential (primary) hypertension: Secondary | ICD-10-CM | POA: Diagnosis not present

## 2024-08-12 DIAGNOSIS — Z6836 Body mass index (BMI) 36.0-36.9, adult: Secondary | ICD-10-CM

## 2024-08-12 DIAGNOSIS — K5732 Diverticulitis of large intestine without perforation or abscess without bleeding: Principal | ICD-10-CM | POA: Diagnosis present

## 2024-08-12 DIAGNOSIS — Z833 Family history of diabetes mellitus: Secondary | ICD-10-CM

## 2024-08-12 DIAGNOSIS — J45909 Unspecified asthma, uncomplicated: Secondary | ICD-10-CM | POA: Diagnosis present

## 2024-08-12 DIAGNOSIS — Z7984 Long term (current) use of oral hypoglycemic drugs: Secondary | ICD-10-CM

## 2024-08-12 DIAGNOSIS — Z794 Long term (current) use of insulin: Secondary | ICD-10-CM

## 2024-08-12 DIAGNOSIS — E139 Other specified diabetes mellitus without complications: Secondary | ICD-10-CM

## 2024-08-12 DIAGNOSIS — R739 Hyperglycemia, unspecified: Principal | ICD-10-CM

## 2024-08-12 DIAGNOSIS — F17210 Nicotine dependence, cigarettes, uncomplicated: Secondary | ICD-10-CM | POA: Diagnosis present

## 2024-08-12 DIAGNOSIS — E119 Type 2 diabetes mellitus without complications: Secondary | ICD-10-CM

## 2024-08-12 DIAGNOSIS — K5792 Diverticulitis of intestine, part unspecified, without perforation or abscess without bleeding: Secondary | ICD-10-CM | POA: Diagnosis present

## 2024-08-12 DIAGNOSIS — Z79899 Other long term (current) drug therapy: Secondary | ICD-10-CM

## 2024-08-12 DIAGNOSIS — E66812 Obesity, class 2: Secondary | ICD-10-CM | POA: Diagnosis present

## 2024-08-12 DIAGNOSIS — E1165 Type 2 diabetes mellitus with hyperglycemia: Secondary | ICD-10-CM | POA: Diagnosis present

## 2024-08-12 DIAGNOSIS — K219 Gastro-esophageal reflux disease without esophagitis: Secondary | ICD-10-CM | POA: Diagnosis present

## 2024-08-12 DIAGNOSIS — F17200 Nicotine dependence, unspecified, uncomplicated: Secondary | ICD-10-CM | POA: Diagnosis present

## 2024-08-12 DIAGNOSIS — M109 Gout, unspecified: Secondary | ICD-10-CM | POA: Diagnosis present

## 2024-08-12 LAB — URINALYSIS, ROUTINE W REFLEX MICROSCOPIC
Bacteria, UA: NONE SEEN
Bilirubin Urine: NEGATIVE
Glucose, UA: >=500 mg/dL — AB
Ketones, ur: 5 mg/dL — AB
Leukocytes,Ua: NEGATIVE
Nitrite: NEGATIVE
Protein, ur: NEGATIVE mg/dL
Specific Gravity, Urine: 1.032 — ABNORMAL HIGH (ref 1.005–1.030)
pH: 6 (ref 5.0–8.0)

## 2024-08-12 LAB — CBG MONITORING, ED: Glucose-Capillary: 324 mg/dL — ABNORMAL HIGH (ref 70–99)

## 2024-08-12 LAB — CBC
HCT: 39 % (ref 39.0–52.0)
Hemoglobin: 13.5 g/dL (ref 13.0–17.0)
MCH: 33.8 pg (ref 26.0–34.0)
MCHC: 34.6 g/dL (ref 30.0–36.0)
MCV: 97.5 fL (ref 80.0–100.0)
Platelets: 238 K/uL (ref 150–400)
RBC: 4 MIL/uL — ABNORMAL LOW (ref 4.22–5.81)
RDW: 13.2 % (ref 11.5–15.5)
WBC: 10.6 K/uL — ABNORMAL HIGH (ref 4.0–10.5)
nRBC: 0 % (ref 0.0–0.2)

## 2024-08-12 LAB — COMPREHENSIVE METABOLIC PANEL WITH GFR
ALT: 31 U/L (ref 0–44)
AST: 27 U/L (ref 15–41)
Albumin: 3.7 g/dL (ref 3.5–5.0)
Alkaline Phosphatase: 92 U/L (ref 38–126)
Anion gap: 11 (ref 5–15)
BUN: 20 mg/dL (ref 6–20)
CO2: 22 mmol/L (ref 22–32)
Calcium: 8.4 mg/dL — ABNORMAL LOW (ref 8.9–10.3)
Chloride: 87 mmol/L — ABNORMAL LOW (ref 98–111)
Creatinine, Ser: 1.06 mg/dL (ref 0.61–1.24)
GFR, Estimated: >60 mL/min (ref >60)
Glucose, Bld: 455 mg/dL — ABNORMAL HIGH (ref 70–99)
Potassium: 4 mmol/L (ref 3.5–5.1)
Sodium: 120 mmol/L — ABNORMAL LOW (ref 135–145)
Total Bilirubin: 1 mg/dL (ref 0.0–1.2)
Total Protein: 7.9 g/dL (ref 6.5–8.1)

## 2024-08-12 LAB — LIPASE, BLOOD: Lipase: 48 U/L (ref 11–51)

## 2024-08-12 MED ORDER — SODIUM CHLORIDE 0.9 % IV SOLN: INTRAVENOUS | Status: AC

## 2024-08-12 MED ORDER — CIPROFLOXACIN IN D5W 400 MG/200ML IV SOLN
400.0000 mg | Freq: Once | INTRAVENOUS | Status: AC
  Administered 2024-08-12: 400 mg via INTRAVENOUS
  Filled 2024-08-12: qty 200

## 2024-08-12 MED ORDER — SODIUM CHLORIDE 0.9 % IV SOLN
2.0000 g | INTRAVENOUS | Status: DC
  Administered 2024-08-13 – 2024-08-15 (×3): 2 g via INTRAVENOUS
  Filled 2024-08-12 (×3): qty 20

## 2024-08-12 MED ORDER — INSULIN ASPART 100 UNIT/ML IJ SOLN
5.0000 [IU] | Freq: Once | INTRAMUSCULAR | Status: AC
  Administered 2024-08-12: 5 [IU] via SUBCUTANEOUS
  Filled 2024-08-12: qty 1

## 2024-08-12 MED ORDER — IOHEXOL 300 MG/ML  SOLN
100.0000 mL | Freq: Once | INTRAMUSCULAR | Status: AC | PRN
  Administered 2024-08-12: 100 mL via INTRAVENOUS

## 2024-08-12 MED ORDER — POLYETHYLENE GLYCOL 3350 17 G PO PACK
17.0000 g | PACK | Freq: Every day | ORAL | Status: DC | PRN
  Administered 2024-08-13: 17 g via ORAL
  Filled 2024-08-12: qty 1

## 2024-08-12 MED ORDER — AMLODIPINE BESYLATE 5 MG PO TABS
10.0000 mg | ORAL_TABLET | Freq: Every day | ORAL | Status: DC
  Administered 2024-08-13 – 2024-08-15 (×3): 10 mg via ORAL
  Filled 2024-08-12 (×3): qty 2

## 2024-08-12 MED ORDER — MORPHINE SULFATE (PF) 2 MG/ML IV SOLN
2.0000 mg | INTRAVENOUS | Status: DC | PRN
  Administered 2024-08-12 – 2024-08-15 (×10): 2 mg via INTRAVENOUS
  Filled 2024-08-12 (×10): qty 1

## 2024-08-12 MED ORDER — INSULIN ASPART 100 UNIT/ML IJ SOLN
0.0000 [IU] | INTRAMUSCULAR | Status: DC
  Administered 2024-08-12: 8 [IU] via SUBCUTANEOUS
  Administered 2024-08-13: 11 [IU] via SUBCUTANEOUS
  Administered 2024-08-13: 15 [IU] via SUBCUTANEOUS
  Administered 2024-08-13: 3 [IU] via SUBCUTANEOUS
  Administered 2024-08-13: 15 [IU] via SUBCUTANEOUS
  Administered 2024-08-13 (×2): 5 [IU] via SUBCUTANEOUS
  Administered 2024-08-14: 3 [IU] via SUBCUTANEOUS
  Administered 2024-08-14: 5 [IU] via SUBCUTANEOUS
  Administered 2024-08-14: 11 [IU] via SUBCUTANEOUS
  Administered 2024-08-14 (×2): 5 [IU] via SUBCUTANEOUS
  Administered 2024-08-14: 3 [IU] via SUBCUTANEOUS
  Administered 2024-08-14: 8 [IU] via SUBCUTANEOUS
  Administered 2024-08-15: 3 [IU] via SUBCUTANEOUS
  Administered 2024-08-15 (×2): 5 [IU] via SUBCUTANEOUS

## 2024-08-12 MED ORDER — SODIUM CHLORIDE 0.9 % IV BOLUS
1000.0000 mL | Freq: Once | INTRAVENOUS | Status: AC
  Administered 2024-08-12: 1000 mL via INTRAVENOUS

## 2024-08-12 MED ORDER — NICOTINE 21 MG/24HR TD PT24
21.0000 mg | MEDICATED_PATCH | Freq: Every day | TRANSDERMAL | Status: DC
Start: 2024-08-13 — End: 2024-08-15
  Administered 2024-08-12 – 2024-08-15 (×4): 21 mg via TRANSDERMAL
  Filled 2024-08-12 (×4): qty 1

## 2024-08-12 MED ORDER — METRONIDAZOLE 500 MG/100ML IV SOLN
500.0000 mg | Freq: Once | INTRAVENOUS | Status: AC
  Administered 2024-08-12: 500 mg via INTRAVENOUS
  Filled 2024-08-12: qty 100

## 2024-08-12 MED ORDER — ONDANSETRON HCL 4 MG PO TABS: 4.0000 mg | ORAL_TABLET | Freq: Four times a day (QID) | ORAL | Status: DC | PRN

## 2024-08-12 MED ORDER — ACETAMINOPHEN 325 MG PO TABS: 650.0000 mg | ORAL_TABLET | Freq: Four times a day (QID) | ORAL | Status: DC | PRN

## 2024-08-12 MED ORDER — ORAL CARE MOUTH RINSE: 15.0000 mL | OROMUCOSAL | Status: DC | PRN

## 2024-08-12 MED ORDER — ALLOPURINOL 300 MG PO TABS
300.0000 mg | ORAL_TABLET | Freq: Every day | ORAL | Status: DC
  Administered 2024-08-13 – 2024-08-15 (×3): 300 mg via ORAL
  Filled 2024-08-12 (×3): qty 1

## 2024-08-12 MED ORDER — MORPHINE SULFATE (PF) 4 MG/ML IV SOLN
4.0000 mg | Freq: Once | INTRAVENOUS | Status: AC
Start: 2024-08-12 — End: 2024-08-12
  Administered 2024-08-12: 4 mg via INTRAVENOUS
  Filled 2024-08-12: qty 1

## 2024-08-12 MED ORDER — ENOXAPARIN SODIUM 60 MG/0.6ML IJ SOSY
60.0000 mg | PREFILLED_SYRINGE | INTRAMUSCULAR | Status: DC
  Administered 2024-08-13 – 2024-08-15 (×3): 60 mg via SUBCUTANEOUS
  Filled 2024-08-12 (×3): qty 0.6

## 2024-08-12 MED ORDER — PANTOPRAZOLE SODIUM 40 MG PO TBEC
40.0000 mg | DELAYED_RELEASE_TABLET | Freq: Every day | ORAL | Status: DC
  Administered 2024-08-13 – 2024-08-15 (×3): 40 mg via ORAL
  Filled 2024-08-12 (×3): qty 1

## 2024-08-12 MED ORDER — ACETAMINOPHEN 650 MG RE SUPP: 650.0000 mg | Freq: Four times a day (QID) | RECTAL | Status: DC | PRN

## 2024-08-12 MED ORDER — METRONIDAZOLE 500 MG/100ML IV SOLN
500.0000 mg | Freq: Two times a day (BID) | INTRAVENOUS | Status: DC
  Administered 2024-08-13 – 2024-08-14 (×4): 500 mg via INTRAVENOUS
  Filled 2024-08-12 (×5): qty 100

## 2024-08-12 MED ORDER — PRAVASTATIN SODIUM 40 MG PO TABS
40.0000 mg | ORAL_TABLET | Freq: Every morning | ORAL | Status: DC
  Administered 2024-08-13 – 2024-08-15 (×3): 40 mg via ORAL
  Filled 2024-08-12 (×3): qty 1

## 2024-08-12 MED ORDER — ONDANSETRON HCL 4 MG/2ML IJ SOLN
4.0000 mg | Freq: Once | INTRAMUSCULAR | Status: AC
  Administered 2024-08-12: 4 mg via INTRAVENOUS
  Filled 2024-08-12: qty 2

## 2024-08-12 MED ORDER — ONDANSETRON HCL 4 MG/2ML IJ SOLN
4.0000 mg | Freq: Four times a day (QID) | INTRAMUSCULAR | Status: DC | PRN
  Administered 2024-08-12: 4 mg via INTRAVENOUS
  Filled 2024-08-12: qty 2

## 2024-08-12 NOTE — Assessment & Plan Note (Signed)
 Stable. -Resume Norvasc , holding lisinopril  for contrast exposure

## 2024-08-12 NOTE — ED Triage Notes (Signed)
 Pt with abd pain with mild nausea and constipation.  Pt with hx of diverticulitis and feels like the same.  +passing gas

## 2024-08-12 NOTE — H&P (Signed)
 History and Physical    Ethan [name]. FMW:[id number] DOB: 03/17/1971 DOA: 08/12/2024  PCP: [facility]   Patient coming from: Home  I have personally briefly reviewed patient's old medical records in [facility] Link  Chief Complaint: Abdominal pain.  HPI: Ethan [name]. is a 53 y.o. male with medical history significant for gout, hypertension.  Patient presented to the ED with complaints of lower abdominal pain that started today.  No nausea, vomiting no diarrhea.  No fevers no chills.  Symptoms feel similar to his prior diverticulitis episodes. Reports increased thirst, drinking lots of water  over the past week.  Prior diagnosis of diabetes, he reports family history of diabetes in his mother and grandmother and cousins.  ED Course: Temperature 98.7.  Heart rate 109.  Respiratory 20.  Blood pressure 124/84.  O2 sat 93% on room air.  WBC 10.6.  Sodium 120.  Blood glucose 155.  Anion gap normal at 11, serum bicarb 22. CTAP WC- mild acute sigmoid diverticulitis or colitis.  No abscess. IV ciprofloxacin  and metronidazole  started in ED.  1 L bolus given. 4mg  morphine  given. 5 units NovoLog  given. Hospitalist to admit for acute diverticulitis, hyponatremia and new diagnosis of diabetes mellitus.  Review of Systems: As per HPI all other systems reviewed and negative.  Past Medical History:  Diagnosis Date   Asthma    Cellulitis    hospitalized 07/2013   Diverticulitis    06/2012   Diverticulitis    GERD (gastroesophageal reflux disease)    Gout    History of cellulitis    Hypercholesterolemia    Hypertension    Hypokalemia    Microscopic hematuria    Pneumonia    Tobacco use    Urethral stricture     Past Surgical History:  Procedure Laterality Date   ARM WOUND REPAIR / CLOSURE     COLONOSCOPY WITH PROPOFOL  N/A 12/12/2023   Procedure: COLONOSCOPY WITH PROPOFOL ;  Surgeon: Shaaron Lamar HERO, MD;  [facility];  Service: Endoscopy;   Laterality: N/A;  7:30 am, asa 2   CYSTOSCOPY WITH URETHRAL DILATATION N/A 04/05/2024   Procedure: CYSTOSCOPY, WITH URETHRAL DILATION;  Surgeon: Penne Knee, MD;  Location: [facility] OR[facility];  Service: Urology;  Laterality: N/A;  BALLOON DILATION USING OPTILUME   ESOPHAGOGASTRODUODENOSCOPY N/A 11/14/2013   MFM:Ypjujo hernia; otherwise negative examination   WRIST ARTHROCENTESIS     WRIST SURGERY  12/2013   right     reports that he has been smoking cigarettes. He started smoking about 35 years ago. He has a 17.9 pack-year smoking history. He has never used smokeless tobacco. He reports current alcohol use of about 1.0 standard drink of alcohol per week. He reports that he does not use drugs.  Allergies  Allergen Reactions   Penicillins Rash    Family History  Problem Relation Age of Onset   Colon cancer Neg Hx     Prior to Admission medications   Medication Sig Start Date End Date Taking? Authorizing Provider  albuterol  (VENTOLIN  HFA) 108 (90 Base) MCG/ACT inhaler Inhale 2 puffs into the lungs every 4 (four) hours as needed for wheezing or shortness of breath. 05/19/24  Yes Yolande Lamar BROCKS, MD  allopurinol  (ZYLOPRIM ) 300 MG tablet Take 300 mg by mouth every morning. 01/24/24  Yes [provider]  amLODipine  (NORVASC ) 10 MG tablet Take 10 mg by mouth every morning.   Yes [provider]  diclofenac  Sodium (VOLTAREN ) 1 % GEL Apply 2  g topically 4 (four) times daily. 06/20/24 06/20/25 Yes [provider]  lisinopril  (ZESTRIL ) 20 MG tablet Take 20 mg by mouth every morning.   Yes [provider]  methylPREDNISolone  (MEDROL  DOSEPAK) 4 MG TBPK tablet Take as directed. 08/09/24  Yes Hagler, Redell, MD  omeprazole (PRILOSEC) 40 MG capsule Take 40 mg by mouth every morning.   Yes [provider]  ondansetron  (ZOFRAN -ODT) 4 MG disintegrating tablet 4mg  ODT q4 hours prn nausea/vomit Patient taking differently: Take 4 mg by mouth every 4 (four) hours as needed  for nausea or vomiting. 4mg  ODT q4 hours prn nausea/vomit 08/04/24  Yes Zammit, Joseph, MD  oxyCODONE -acetaminophen  (PERCOCET/ROXICET) 5-325 MG tablet Take 1 tablet by mouth every 6 (six) hours as needed for severe pain (pain score 7-10). 08/04/24  Yes Zammit, Joseph, MD  pravastatin  (PRAVACHOL ) 40 MG tablet Take 40 mg by mouth every morning.   Yes [provider]  tadalafil  (CIALIS ) 20 MG tablet Take 1 tablet (20 mg total) by mouth daily as needed for erectile dysfunction. 06/06/24  Yes Vaillancourt, Samantha, PA-C  Cholecalciferol 1.25 MG (50000 UT) TABS Take 1 tablet by mouth every morning.    [provider]  diphenhydrAMINE (BENADRYL) 25 MG tablet Take 25 mg by mouth every 6 (six) hours as needed for allergies.    [provider]  oxybutynin  (DITROPAN ) 5 MG tablet Take 1 tablet (5 mg total) by mouth every 8 (eight) hours as needed for bladder spasms. 04/05/24   Penne Knee, MD  Phenyleph-Doxylamine-DM-APAP (NYQUIL SEVERE COLD/FLU) 5-6.25-10-325 MG CAPS Take 1-2 Capfuls by mouth daily as needed (for cold/flu symptoms).    [provider]    Physical Exam: Vitals:   08/12/24 1535 08/12/24 1538  BP:  124/84  Pulse:  (!) 109  Resp:  20  Temp:  98.7 F (37.1 C)  TempSrc:  Oral  SpO2:  93%  Weight: 131.5 kg   Height: 6' 3 (1.905 m)     Constitutional: NAD, calm, comfortable Vitals:   08/12/24 1535 08/12/24 1538  BP:  124/84  Pulse:  (!) 109  Resp:  20  Temp:  98.7 F (37.1 C)  TempSrc:  Oral  SpO2:  93%  Weight: 131.5 kg   Height: 6' 3 (1.905 m)    Eyes: PERRL, lids and conjunctivae normal ENMT: Mucous membranes are moist.   Neck: normal, supple, no masses, no thyromegaly Respiratory: clear to auscultation bilaterally, no wheezing, no crackles. Normal respiratory effort. No accessory muscle use.  Cardiovascular: Regular rate and rhythm, no murmurs / rubs / gallops. No extremity edema.  Extremities warm. Abdomen: Lower abdominal tenderness, no  masses palpated. No hepatosplenomegaly.   Musculoskeletal: no clubbing / cyanosis. No joint deformity upper and lower extremities. Good ROM, no contractures. Normal muscle tone.  Skin: no rashes, lesions, ulcers. No induration Neurologic: No facial asymmetry, moves extremity spontaneously, speech fluent. Psychiatric: Normal judgment and insight. Alert and oriented x 3. Normal mood.   Labs on Admission: I have personally reviewed following labs and imaging studies  CBC: Recent Labs  Lab 08/12/24 1537  WBC 10.6*  HGB 13.5  HCT 39.0  MCV 97.5  PLT 238   Basic Metabolic Panel: Recent Labs  Lab 08/12/24 1537  NA 120*  K 4.0  CL 87*  CO2 22  GLUCOSE 455*  BUN 20  CREATININE 1.06  CALCIUM 8.4*   GFR: Estimated Creatinine Clearance: 120.5 mL/min (by C-G formula based on SCr of 1.06 mg/dL). Liver Function Tests: Recent Labs  Lab 08/12/24 1537  AST 27  ALT 31  ALKPHOS 92  BILITOT 1.0  PROT 7.9  ALBUMIN 3.7   Recent Labs  Lab 08/12/24 1537  LIPASE 48   Urine analysis:    Component Value Date/Time   COLORURINE STRAW (A) 08/12/2024 2112   APPEARANCEUR CLEAR 08/12/2024 2112   APPEARANCEUR Clear 04/27/2024 1408   LABSPEC 1.032 (H) 08/12/2024 2112   PHURINE 6.0 08/12/2024 2112   GLUCOSEU >=500 (A) 08/12/2024 2112   HGBUR MODERATE (A) 08/12/2024 2112   BILIRUBINUR NEGATIVE 08/12/2024 2112   BILIRUBINUR Negative 04/27/2024 1408   KETONESUR 5 (A) 08/12/2024 2112   PROTEINUR NEGATIVE 08/12/2024 2112   UROBILINOGEN 0.2 11/30/2013 1436   NITRITE NEGATIVE 08/12/2024 2112   LEUKOCYTESUR NEGATIVE 08/12/2024 2112    Radiological Exams on Admission: CT ABDOMEN PELVIS W CONTRAST Result Date: 08/12/2024 CLINICAL DATA:  Abdominal pain. EXAM: CT ABDOMEN AND PELVIS WITH CONTRAST TECHNIQUE: Multidetector CT imaging of the abdomen and pelvis was performed using the standard protocol following bolus administration of intravenous contrast. RADIATION DOSE REDUCTION: This exam was  performed according to the departmental dose-optimization program which includes automated exposure control, adjustment of the mA and/or kV according to patient size and/or use of iterative reconstruction technique. CONTRAST:  OMNIPAQUE  IOHEXOL  300 MG/ML  SOLN COMPARISON:  CT abdomen pelvis dated 08/04/2024. FINDINGS: Lower chest: The visualized lung bases are clear. No intra-abdominal free air or free fluid. Hepatobiliary: Fatty liver. No biliary dilatation. The gallbladder is unremarkable. Pancreas: Unremarkable. No pancreatic ductal dilatation or surrounding inflammatory changes. Spleen: Normal in size without focal abnormality. Adrenals/Urinary Tract: The adrenal glands, kidneys, visualized ureters, and urinary bladder unremarkable. Stomach/Bowel: There is sigmoid diverticulosis. There is mild thickened appearance of the sigmoid colon with adjacent stranding, new since the prior CT which may represent mild acute diverticulitis or colitis. There is an area of stranding and haziness extending from the perisigmoid to the left hemipelvis likely extension of inflammatory/infectious process. A focal area of fat infarct is less likely. Clinical correlation and follow-up recommended. No fluid collection or abscess. Small hiatal hernia. There is no bowel obstruction.  The appendix is normal. Vascular/Lymphatic: Mild aortoiliac atherosclerotic disease. The IVC is unremarkable. No portal venous gas. There is no adenopathy. Reproductive: The prostate and seminal vesicles are grossly unremarkable. Other: Small fat containing umbilical hernia. Musculoskeletal: Degenerative changes of the spine. No acute osseous pathology. IMPRESSION: 1. Mild acute sigmoid diverticulitis or colitis. No abscess. 2. Fatty liver. 3.  Aortic Atherosclerosis (ICD10-I70.0). Electronically Signed   By: Vanetta Chou M.D.   On: 08/12/2024 18:34   EKG: None.   Assessment/Plan Principal Problem:   Acute diverticulitis Active Problems:    Hypertension   Tobacco use disorder   Diabetes mellitus (HCC)  Assessment and Plan: * Acute diverticulitis Presenting with lower abdominal pain, CT shows small acute sigmoid diverticulitis/colitis.  Rules out for sepsis.  WBC 10.6.  Afebrile. -2 L bolus, continue N/s 100cc/hr x 20 hrs - Metronidazole  and IV ceftriaxone  ( penicillin allergy- rash noted.) - IV morphine  2 mg every 4 hourly as needed - Bowel rest with clear liquid diet  Hyperglycemia in new onset diabetes mellitus (HCC) New diagnosis of diabetes mellitus.  Presenting with increased thirst and polydipsia.  Blood sugar 455.  Not in DKA.  Anion gap of 11, serum bicarb of 22. - SSI- M - HgbA1C -DM coordinator consult  Hyponatremia sodium 120, corrected sodium- 126.  133-140.   - Admitted to stepdown , cutoff for telemetry bed  is 126 - hydrate.  Hypertension Stable. -Resume Norvasc , holding lisinopril  for contrast exposure  Gout - Resume allopurinol   DVT prophylaxis: Lovenox  Code Status: FULL Code Family Communication: Male friend at bedside Sarah. Disposition Plan: ~ 2 days Consults called: None Admission status:  Obs, stepdown   Author: Tully FORBES Carwin, MD 08/12/2024 9:49 PM  For on call review [email or web address].

## 2024-08-12 NOTE — Assessment & Plan Note (Signed)
 New diagnosis of diabetes mellitus.  Presenting with increased thirst and polydipsia.  Blood sugar 455.  Not in DKA.  Anion gap of 11, serum bicarb of 22. - SSI- M - HgbA1C -DM coordinator consult

## 2024-08-12 NOTE — ED Provider Notes (Addendum)
 [place] EMERGENCY DEPARTMENT AT [facility] Provider Note   CSN: [id number] Arrival date & time: 08/12/24  1527     Patient presents with: Abdominal Pain   Ethan [name]. is a 53 y.o. male with a history occluding asthma, hypertension, GERD, history of diverticulitis and was treated for mild pancreatitis last week which he states has completely resolved but now returns with left lower abdominal pain along with nausea without emesis and small amounts of passage of stool but with sensation of incomplete emptying.  He has been passing gas, he denies bloody stools, denies history of constipation and denies rectal pain.  He states his symptoms are similar to prior episode of diverticulitis.  He denies fevers or chills, vomiting, no back pain, no chest pain or shortness of breath.  He has had no treatment prior to arrival.   The history is provided by the patient.       Prior to Admission medications   Medication Sig Start Date End Date Taking? Authorizing Provider  diclofenac  Sodium (VOLTAREN ) 1 % GEL Apply 2 g topically 4 (four) times daily. 06/20/24 06/20/25 Yes [provider]  albuterol  (VENTOLIN  HFA) 108 (90 Base) MCG/ACT inhaler Inhale 2 puffs into the lungs every 4 (four) hours as needed for wheezing or shortness of breath. 05/19/24   Yolande Lamar BROCKS, MD  allopurinol  (ZYLOPRIM ) 300 MG tablet Take 300 mg by mouth every morning. 01/24/24   [provider]  amLODipine  (NORVASC ) 10 MG tablet Take 10 mg by mouth every morning.    [provider]  Cholecalciferol 1.25 MG (50000 UT) TABS Take 1 tablet by mouth every morning.    [provider]  diphenhydrAMINE (BENADRYL) 25 MG tablet Take 25 mg by mouth every 6 (six) hours as needed for allergies.    [provider]  lisinopril  (ZESTRIL ) 20 MG tablet Take 20 mg by mouth every morning.    [provider]  methylPREDNISolone  (MEDROL  DOSEPAK) 4 MG TBPK tablet Take as  directed. 08/09/24   Rolinda Rogue, MD  omeprazole (PRILOSEC) 40 MG capsule Take 40 mg by mouth every morning.    [provider]  ondansetron  (ZOFRAN -ODT) 4 MG disintegrating tablet 4mg  ODT q4 hours prn nausea/vomit 08/04/24   Zammit, Joseph, MD  oxybutynin  (DITROPAN ) 5 MG tablet Take 1 tablet (5 mg total) by mouth every 8 (eight) hours as needed for bladder spasms. 04/05/24   Penne Knee, MD  oxyCODONE -acetaminophen  (PERCOCET/ROXICET) 5-325 MG tablet Take 1 tablet by mouth every 6 (six) hours as needed for severe pain (pain score 7-10). 08/04/24   Zammit, Joseph, MD  Phenyleph-Doxylamine-DM-APAP (NYQUIL SEVERE COLD/FLU) 5-6.25-10-325 MG CAPS Take 1-2 Capfuls by mouth daily as needed (for cold/flu symptoms).    [provider]  pravastatin  (PRAVACHOL ) 40 MG tablet Take 40 mg by mouth every morning.    [provider]  tadalafil  (CIALIS ) 20 MG tablet Take 1 tablet (20 mg total) by mouth daily as needed for erectile dysfunction. 06/06/24   Vaillancourt, Samantha, PA-C    Allergies: Penicillins    Review of Systems  Constitutional:  Negative for chills and fever.  HENT:  Negative for congestion.   Eyes: Negative.   Respiratory:  Negative for chest tightness and shortness of breath.   Cardiovascular:  Negative for chest pain.  Gastrointestinal:  Positive for abdominal pain and nausea. Negative for vomiting.  Endocrine: Positive for polydipsia and polyuria.  Genitourinary: Negative.   Musculoskeletal:  Negative for arthralgias, joint swelling and neck  pain.  Skin: Negative.  Negative for rash and wound.  Neurological:  Negative for dizziness, weakness, light-headedness, numbness and headaches.  Psychiatric/Behavioral: Negative.      Updated Vital Signs BP 124/84 (BP Location: Right Arm)   Pulse (!) 109   Temp 98.7 F (37.1 C) (Oral)   Resp 20   Ht 6' 3 (1.905 m)   Wt 131.5 kg   SpO2 93%   BMI 36.25 kg/m   Physical Exam Vitals and nursing note reviewed.   Constitutional:      Appearance: He is well-developed.  HENT:     Head: Normocephalic and atraumatic.  Eyes:     Conjunctiva/sclera: Conjunctivae normal.  Cardiovascular:     Rate and Rhythm: Normal rate and regular rhythm.     Heart sounds: Normal heart sounds.  Pulmonary:     Effort: Pulmonary effort is normal.     Breath sounds: Normal breath sounds. No wheezing.  Abdominal:     General: Abdomen is protuberant. Bowel sounds are normal.     Palpations: Abdomen is soft.     Tenderness: There is abdominal tenderness in the left lower quadrant. There is no guarding or rebound.  Musculoskeletal:        General: Normal range of motion.     Cervical back: Normal range of motion.  Skin:    General: Skin is warm and dry.  Neurological:     Mental Status: He is alert.     (all labs ordered are listed, but only abnormal results are displayed) Labs Reviewed  COMPREHENSIVE METABOLIC PANEL WITH GFR - Abnormal; Notable for the following components:      Result Value   Sodium 120 (*)    Chloride 87 (*)    Glucose, Bld 455 (*)    Calcium 8.4 (*)    All other components within normal limits  CBC - Abnormal; Notable for the following components:   WBC 10.6 (*)    RBC 4.00 (*)    All other components within normal limits  LIPASE, BLOOD  URINALYSIS, ROUTINE W REFLEX MICROSCOPIC    EKG: None  Radiology: CT ABDOMEN PELVIS W CONTRAST Result Date: 08/12/2024 CLINICAL DATA:  Abdominal pain. EXAM: CT ABDOMEN AND PELVIS WITH CONTRAST TECHNIQUE: Multidetector CT imaging of the abdomen and pelvis was performed using the standard protocol following bolus administration of intravenous contrast. RADIATION DOSE REDUCTION: This exam was performed according to the departmental dose-optimization program which includes automated exposure control, adjustment of the mA and/or kV according to patient size and/or use of iterative reconstruction technique. CONTRAST:  OMNIPAQUE  IOHEXOL  300 MG/ML  SOLN  COMPARISON:  CT abdomen pelvis dated 08/04/2024. FINDINGS: Lower chest: The visualized lung bases are clear. No intra-abdominal free air or free fluid. Hepatobiliary: Fatty liver. No biliary dilatation. The gallbladder is unremarkable. Pancreas: Unremarkable. No pancreatic ductal dilatation or surrounding inflammatory changes. Spleen: Normal in size without focal abnormality. Adrenals/Urinary Tract: The adrenal glands, kidneys, visualized ureters, and urinary bladder unremarkable. Stomach/Bowel: There is sigmoid diverticulosis. There is mild thickened appearance of the sigmoid colon with adjacent stranding, new since the prior CT which may represent mild acute diverticulitis or colitis. There is an area of stranding and haziness extending from the perisigmoid to the left hemipelvis likely extension of inflammatory/infectious process. A focal area of fat infarct is less likely. Clinical correlation and follow-up recommended. No fluid collection or abscess. Small hiatal hernia. There is no bowel obstruction.  The appendix is normal. Vascular/Lymphatic: Mild aortoiliac atherosclerotic disease. The  IVC is unremarkable. No portal venous gas. There is no adenopathy. Reproductive: The prostate and seminal vesicles are grossly unremarkable. Other: Small fat containing umbilical hernia. Musculoskeletal: Degenerative changes of the spine. No acute osseous pathology. IMPRESSION: 1. Mild acute sigmoid diverticulitis or colitis. No abscess. 2. Fatty liver. 3.  Aortic Atherosclerosis (ICD10-I70.0). Electronically Signed   By: Vanetta Chou M.D.   On: 08/12/2024 18:34     Procedures   Medications Ordered in the ED  insulin  aspart (novoLOG ) injection 5 Units (has no administration in time range)  ciprofloxacin  (CIPRO ) IVPB 400 mg (has no administration in time range)  metroNIDAZOLE  (FLAGYL ) IVPB 500 mg (has no administration in time range)  sodium chloride  0.9 % bolus 1,000 mL (1,000 mLs Intravenous New Bag/Given  08/12/24 1725)  iohexol  (OMNIPAQUE ) 300 MG/ML solution 100 mL (100 mLs Intravenous Contrast Given 08/12/24 1749)  morphine  (PF) 4 MG/ML injection 4 mg (4 mg Intravenous Given 08/12/24 1836)  ondansetron  (ZOFRAN ) injection 4 mg (4 mg Intravenous Given 08/12/24 1834)                                    Medical Decision Making Patient presenting with left lower quadrant abdominal pain which she endorses feels like prior episode of diverticulitis.  Probably return of this condition, other possibilities include constipation, colitis, bowel obstruction.  Labs and imaging as outlined below.  Labs were surprising for a significant glucose of 455, patient denies history of diabetes but has strong family history.  When questioned further he does endorse having polydipsia and polyuria over the past 1 to 2 weeks.  He also has a significant hyponatremia, 126 after correction for glucose.  Mild diverticulitis.  He was given IV fluids, insulin  aspart 5 units subcu, Cipro  and Flagyl  IV have been ordered.  Discussed admission and patient is agreeable.  Amount and/or Complexity of Data Reviewed Labs: ordered.    Details: Labs significant for a sodium of 128, corrected to 126, glucose 455, his chloride is 87, lipase normal at 48 LFTs are normal, WBC count 10.6.  Pending urine at this time. Radiology: ordered.    Details: CT imaging confirming mild sigmoid diverticulitis without abscess. Discussion of management or test interpretation with external provider(s): Call placed to hospitalist for admission.  Spoke with Dr. Pearlean who accepts patient for admission  Risk Prescription drug management. Decision regarding hospitalization.        Final diagnoses:  Hyperglycemia  Hyponatremia  Diverticulitis    ED Discharge Orders     None          Birdena Mliss RIGGERS 08/12/24 1958    Birdena Mliss RIGGERS 08/12/24 VITO Suzette Pac, MD 08/13/24 1216

## 2024-08-12 NOTE — Assessment & Plan Note (Addendum)
 Presenting with lower abdominal pain, CT shows small acute sigmoid diverticulitis/colitis.  Rules out for sepsis.  WBC 10.6.  Afebrile. -1.5 L bolus given, continue N/s 100cc/hr x 20 hrs - Metronidazole  and IV ceftriaxone  ( penicillin allergy- rash noted.) - IV morphine  2 mg every 4 hourly as needed - Bowel rest with clear liquid diet

## 2024-08-13 DIAGNOSIS — K5732 Diverticulitis of large intestine without perforation or abscess without bleeding: Secondary | ICD-10-CM | POA: Diagnosis present

## 2024-08-13 DIAGNOSIS — E1165 Type 2 diabetes mellitus with hyperglycemia: Secondary | ICD-10-CM | POA: Diagnosis present

## 2024-08-13 DIAGNOSIS — Z794 Long term (current) use of insulin: Secondary | ICD-10-CM | POA: Diagnosis not present

## 2024-08-13 DIAGNOSIS — Z6836 Body mass index (BMI) 36.0-36.9, adult: Secondary | ICD-10-CM | POA: Diagnosis not present

## 2024-08-13 DIAGNOSIS — I10 Essential (primary) hypertension: Secondary | ICD-10-CM | POA: Diagnosis present

## 2024-08-13 DIAGNOSIS — F17210 Nicotine dependence, cigarettes, uncomplicated: Secondary | ICD-10-CM | POA: Diagnosis present

## 2024-08-13 DIAGNOSIS — Z880 Allergy status to penicillin: Secondary | ICD-10-CM | POA: Diagnosis not present

## 2024-08-13 DIAGNOSIS — E66812 Obesity, class 2: Secondary | ICD-10-CM | POA: Diagnosis present

## 2024-08-13 DIAGNOSIS — K219 Gastro-esophageal reflux disease without esophagitis: Secondary | ICD-10-CM | POA: Diagnosis present

## 2024-08-13 DIAGNOSIS — Z7984 Long term (current) use of oral hypoglycemic drugs: Secondary | ICD-10-CM | POA: Diagnosis not present

## 2024-08-13 DIAGNOSIS — J45909 Unspecified asthma, uncomplicated: Secondary | ICD-10-CM | POA: Diagnosis present

## 2024-08-13 DIAGNOSIS — Z79899 Other long term (current) drug therapy: Secondary | ICD-10-CM | POA: Diagnosis not present

## 2024-08-13 DIAGNOSIS — E871 Hypo-osmolality and hyponatremia: Secondary | ICD-10-CM | POA: Diagnosis present

## 2024-08-13 DIAGNOSIS — M109 Gout, unspecified: Secondary | ICD-10-CM | POA: Diagnosis present

## 2024-08-13 DIAGNOSIS — K5792 Diverticulitis of intestine, part unspecified, without perforation or abscess without bleeding: Secondary | ICD-10-CM | POA: Diagnosis present

## 2024-08-13 DIAGNOSIS — Z833 Family history of diabetes mellitus: Secondary | ICD-10-CM | POA: Diagnosis not present

## 2024-08-13 DIAGNOSIS — E7800 Pure hypercholesterolemia, unspecified: Secondary | ICD-10-CM | POA: Diagnosis present

## 2024-08-13 LAB — HEMOGLOBIN A1C
Hgb A1c MFr Bld: 8.7 % — ABNORMAL HIGH (ref 4.8–5.6)
Mean Plasma Glucose: 202.99 mg/dL

## 2024-08-13 LAB — BASIC METABOLIC PANEL WITH GFR
Anion gap: 7 (ref 5–15)
Anion gap: 15 (ref 5–15)
BUN: 14 mg/dL (ref 6–20)
BUN: 9 mg/dL (ref 6–20)
CO2: 24 mmol/L (ref 22–32)
CO2: 23 mmol/L (ref 22–32)
Calcium: 8.4 mg/dL — ABNORMAL LOW (ref 8.9–10.3)
Calcium: 9.1 mg/dL (ref 8.9–10.3)
Chloride: 104 mmol/L (ref 98–111)
Chloride: 96 mmol/L — ABNORMAL LOW (ref 98–111)
Creatinine, Ser: 0.78 mg/dL (ref 0.61–1.24)
Creatinine, Ser: 0.97 mg/dL (ref 0.61–1.24)
GFR, Estimated: >60 mL/min (ref >60)
GFR, Estimated: >60 mL/min (ref >60)
Glucose, Bld: 184 mg/dL — ABNORMAL HIGH (ref 70–99)
Glucose, Bld: 371 mg/dL — ABNORMAL HIGH (ref 70–99)
Potassium: 4.2 mmol/L (ref 3.5–5.1)
Potassium: 4 mmol/L (ref 3.5–5.1)
Sodium: 135 mmol/L (ref 135–145)
Sodium: 134 mmol/L — ABNORMAL LOW (ref 135–145)

## 2024-08-13 LAB — GLUCOSE, CAPILLARY
Glucose-Capillary: 197 mg/dL — ABNORMAL HIGH (ref 70–99)
Glucose-Capillary: 202 mg/dL — ABNORMAL HIGH (ref 70–99)
Glucose-Capillary: 215 mg/dL — ABNORMAL HIGH (ref 70–99)
Glucose-Capillary: 229 mg/dL — ABNORMAL HIGH (ref 70–99)
Glucose-Capillary: 290 mg/dL — ABNORMAL HIGH (ref 70–99)
Glucose-Capillary: 338 mg/dL — ABNORMAL HIGH (ref 70–99)
Glucose-Capillary: 355 mg/dL — ABNORMAL HIGH (ref 70–99)
Glucose-Capillary: 357 mg/dL — ABNORMAL HIGH (ref 70–99)

## 2024-08-13 LAB — CBC
HCT: 36.1 % — ABNORMAL LOW (ref 39.0–52.0)
Hemoglobin: 11.9 g/dL — ABNORMAL LOW (ref 13.0–17.0)
MCH: 33.2 pg (ref 26.0–34.0)
MCHC: 33 g/dL (ref 30.0–36.0)
MCV: 100.8 fL — ABNORMAL HIGH (ref 80.0–100.0)
Platelets: 219 K/uL (ref 150–400)
RBC: 3.58 MIL/uL — ABNORMAL LOW (ref 4.22–5.81)
RDW: 13.2 % (ref 11.5–15.5)
WBC: 11.2 K/uL — ABNORMAL HIGH (ref 4.0–10.5)
nRBC: 0 % (ref 0.0–0.2)

## 2024-08-13 LAB — HIV ANTIBODY (ROUTINE TESTING W REFLEX): HIV Screen 4th Generation wRfx: NONREACTIVE

## 2024-08-13 LAB — MRSA NEXT GEN BY PCR, NASAL: MRSA by PCR Next Gen: NOT DETECTED

## 2024-08-13 MED ORDER — SORBITOL 70 % SOLN
60.0000 mL | Freq: Once | Status: AC
Start: 2024-08-13 — End: 2024-08-13
  Administered 2024-08-13: 60 mL via ORAL
  Filled 2024-08-13: qty 60

## 2024-08-13 MED ORDER — LIVING WELL WITH DIABETES BOOK: Freq: Once | Status: AC

## 2024-08-13 MED ORDER — INSULIN GLARGINE 100 UNIT/ML ~~LOC~~ SOLN
5.0000 [IU] | Freq: Every day | SUBCUTANEOUS | Status: DC
  Administered 2024-08-13: 5 [IU] via SUBCUTANEOUS
  Filled 2024-08-13 (×3): qty 0.05

## 2024-08-13 MED ORDER — POLYETHYLENE GLYCOL 3350 17 G PO PACK
17.0000 g | PACK | Freq: Two times a day (BID) | ORAL | Status: DC
  Administered 2024-08-13 – 2024-08-15 (×4): 17 g via ORAL
  Filled 2024-08-13 (×4): qty 1

## 2024-08-13 NOTE — Inpatient Diabetes Management (Addendum)
 Inpatient Diabetes Program Recommendations  AACE/ADA: New Consensus Statement on Inpatient Glycemic Control   Target Ranges:  Prepandial:   less than 140 mg/dL      Peak postprandial:   less than 180 mg/dL (1-2 hours)      Critically ill patients:  140 - 180 mg/dL    Latest Reference Range & Units 08/12/24 20:32 08/13/24 00:35 08/13/24 04:28 08/13/24 07:43  Glucose-Capillary 70 - 99 mg/dL 675 (H) 644 (H) 797 (H) 197 (H)    Latest Reference Range & Units 08/12/24 15:37  CO2 22 - 32 mmol/L 22  Glucose 70 - 99 mg/dL 544 (H)  Anion gap 5 - 15  11    Latest Reference Range & Units 08/09/13 12:30 06/25/14 13:03  Hemoglobin A1C <5.7 % 6.3 (H) 6.0 (H)   Review of Glycemic Control  Diabetes history: DM2 (dx 2020) Outpatient Diabetes medications: None Current orders for Inpatient glycemic control: Novolog  0-15 units Q4H  Inpatient Diabetes Program Recommendations:    Insulin : Please consider ordering insulin  glargine 13 units Q24H.  HbgA1C: Current A1C in process.  Last A1C in chart 6.0% on 06/25/2014.  Outpatient DM: At time of discharge, please provide Rx for glucose monitoring kit (#[id number]) and DM medications.  NOTE: Patient admitted with acute diverticulitis. Initial lab glucose 455 mg/dl on 0/85/74. Per H&P, patient does not have hx of DM. Ordered Living Well with DM book and consulted RD for diet education. Will plan to talk with patient today.  Addendum 08/14/24@13 :40-Spoke with patient at bedside about diabetes diagnosis.  Patient reports that he was dx with DM in 2020 and he had been on a pill (maybe Amaryl) but he made lifestyle changes with diet and exercise and he was able to stop the DM medication. Patient reports that after he stopped DM medication he got rid of DM.   Patient reports that he has several family members with DM. Patient reports that he has an appointment with a new PCP on 08/31/24 to establish care.   Discussed initial glucose of 455 mg/dl and patient reports he  was having symptoms of hyperglycemia for several weeks prior to admission.  Patient reports he does not know a lot about DM.  Discussed basic pathophysiology of DM Type 2, basic home care, importance of checking CBGs and maintaining good CBG control to prevent long-term and short-term complications. Reviewed glucose and A1C goals.   Reviewed signs and symptoms of hyperglycemia and hypoglycemia along with treatment for both. Discussed impact of nutrition, exercise, stress, sickness, and medications on diabetes control. Discussed Carb Modified diet, plate method, eating carbohydrates in moderation. Discussed how DM2 can be a progressive disease and stressed that he will need to keep DM under control, take DM medications consistently, and follow up with PCP routinely. Explained that if he is able to get DM controlled with lifestyle modifications in the future, he will still have DM and will need to follow up with PCP every 3-6 months to ensure glucose stays controlled.   Informed patient that a Living Well with diabetes booklet was ordered and encouraged patient to read through entire book.  Explained how hyperglycemia leads to damage within blood vessels which lead to the common complications seen with uncontrolled diabetes. Stressed to the patient the importance of improving glycemic control to prevent further complications from uncontrolled diabetes. Discussed impact of nutrition, exercise, stress, sickness, and medications on diabetes control.  Discussed carbohydrates, carbohydrate goals per day and meal, along with portion sizes. Encouraged patient to check glucose  3-4 times per day and to keep a log book of glucose readings and DM medication taken which patient will need to take to doctor appointments. Explained how the doctor can use the log book to continue to make adjustments with DM medications if needed.  Patient states that the doctor has told him he will likely be discharged on oral DM medications. Patient  verbalized understanding of information discussed and reports no further questions at this time related to diabetes.  RNs to provide ongoing basic DM education at bedside with this patient.  Thanks, Earnie Gainer, RN, MSN, CDCES Diabetes Coordinator Inpatient Diabetes Program [phone] (Team Pager from 8am to 5pm)

## 2024-08-13 NOTE — Discharge Instructions (Signed)

## 2024-08-13 NOTE — Progress Notes (Addendum)
 Still complaining of pain , stated not passing gas and had only very small bm. Encouraged to walk in hallway.  Texted this information to Dr. Bryn and he ordered another dose of miralax  and a dose of sorbitol 

## 2024-08-13 NOTE — Progress Notes (Signed)
 [facility] PROGRESS NOTE  Ethan [name]. (DOB: 06-Oct-1971) FMW:[id number] PCP: [facility]  Brief Narrative: Ethan [name]. is a 53 y.o. male with a history of diet-controlled T2DM, HTN, obesity, a single episode of diverticulitis 2013, and gout who presented to the ED on 08/12/2024 with lower abdominal pain. CT confirmed his suspicion of diverticulitis, acute-appearing around the sigmoid colon without abscess. Antibiotics have been started. Metabolic derangements, notably hyperglycemia to 455 was also noted without acidosis. HbA1c was checked, sliding scale insulin  started, diabetes coordinator consulted, and he was admitted to SDU due to hyponatremia (corrected Na 126). Glycemic control has improved, as have metabolic parameters.   Subjective: LLQ abdominal pain is stable, not worse with po intake. Had small hard BM this AM. No fevers.   Objective: BP (!) 111/55   Pulse 85   Temp 98.4 F (36.9 C) (Oral)   Resp 20   Ht 6' 3 (1.905 m)   Wt 131.1 kg   SpO2 94%   BMI 36.13 kg/m   Gen: No distress Pulm: Clear, nonlabored  CV: RRR, no MRG GI: Soft, NT, ND, +BS Neuro: Alert and oriented. No new focal deficits. Ext: Warm, no deformities Skin: No rashes, lesions or ulcers on visualized skin   Assessment & Plan: Acute, recurrent sigmoid diverticulitis:  - Continue CTX, flagyl . WBC still elevated, exam is benign, and he's tolerating some po, can advance diet still liquids, continue IVF.  - Continue IV morphine  prn  T2DM: HbA1c 8.7%. Pt previously on oral medications but came off with lifestyle modifications. Has +FH DM early onset in his mother. Has had polyuria/polydipsia this past week as well.  - DM coordinator consulted.  - Continue AC/HS SSI and CBG checks. Add low dose basal today.   - Acute hyperglycemia due to infectious/inflammatory process. Will plan to initiate oral agent(s) at discharge and follow up with PCP to avoid  overcorrection/hypoglycemia after inflammation resolves. Pt motivated to enact lifestyle modifications.  - On ACEi, statin  Hyponatremia: Resolved.  - Transfer to floor, plan to complete IVF this evening.   HTN:  - Continue ACEi given Dx DM, continue norvasc   Gout: Quiescent.  - Continue allopurinol .   Bernardino KATHEE Come, MD [facility] [email or web address] 08/13/2024, 8:55 AM

## 2024-08-13 NOTE — Progress Notes (Signed)
 Transition of Care Department [facility]) has reviewed patient and no other TOC needs have been identified at this time. We will continue to monitor patient advancement through interdisciplinary progression rounds. If new patient transition needs arise, please place a TOC consult.   08/13/24 0749  TOC Brief Assessment  Insurance and Status Reviewed  Patient has primary care physician Yes  Home environment has been reviewed Lives alone.  Prior level of function: Independent.  Prior/Current Home Services No current home services  Social Drivers of Health Review SDOH reviewed no interventions necessary  Readmission risk has been reviewed Yes  Transition of care needs no transition of care needs at this time

## 2024-08-13 NOTE — Progress Notes (Addendum)
 Nutrition Education Note  RD consulted for nutrition education regarding diabetes.   Lab Results  Component Value Date   HGBA1C 8.7 (H) 08/13/2024    RD provided Carbohydrate Counting for People with Diabetes and Plate Method for Diabetes handouts from the Academy of [employer] Dietetics, added to discharge summary.   Body mass index is 36.13 kg/m. Pt meets criteria for obesity based on current BMI.  Current diet order is full liquids. Labs and medications reviewed. No further nutrition interventions warranted at this time. RD contact information provided. If additional nutrition issues arise, please re-consult RD. Outpatient diabetes diet education referral ordered.   Suzen HUNT RD, LDN, CNSC Contact via secure chat. If unavailable, use group chat RD Inpatient.

## 2024-08-14 ENCOUNTER — Inpatient Hospital Stay (HOSPITAL_COMMUNITY): Payer: MEDICAID

## 2024-08-14 DIAGNOSIS — K5792 Diverticulitis of intestine, part unspecified, without perforation or abscess without bleeding: Secondary | ICD-10-CM | POA: Diagnosis not present

## 2024-08-14 LAB — GLUCOSE, CAPILLARY
Glucose-Capillary: 157 mg/dL — ABNORMAL HIGH (ref 70–99)
Glucose-Capillary: 173 mg/dL — ABNORMAL HIGH (ref 70–99)
Glucose-Capillary: 247 mg/dL — ABNORMAL HIGH (ref 70–99)
Glucose-Capillary: 258 mg/dL — ABNORMAL HIGH (ref 70–99)
Glucose-Capillary: 278 mg/dL — ABNORMAL HIGH (ref 70–99)
Glucose-Capillary: 334 mg/dL — ABNORMAL HIGH (ref 70–99)

## 2024-08-14 LAB — CBC
HCT: 36.4 % — ABNORMAL LOW (ref 39.0–52.0)
Hemoglobin: 11.9 g/dL — ABNORMAL LOW (ref 13.0–17.0)
MCH: 33.1 pg (ref 26.0–34.0)
MCHC: 32.7 g/dL (ref 30.0–36.0)
MCV: 101.4 fL — ABNORMAL HIGH (ref 80.0–100.0)
Platelets: 232 K/uL (ref 150–400)
RBC: 3.59 MIL/uL — ABNORMAL LOW (ref 4.22–5.81)
RDW: 12.9 % (ref 11.5–15.5)
WBC: 9.7 K/uL (ref 4.0–10.5)
nRBC: 0 % (ref 0.0–0.2)

## 2024-08-14 LAB — BASIC METABOLIC PANEL WITH GFR
Anion gap: 7 (ref 5–15)
BUN: 7 mg/dL (ref 6–20)
CO2: 24 mmol/L (ref 22–32)
Calcium: 8.3 mg/dL — ABNORMAL LOW (ref 8.9–10.3)
Chloride: 103 mmol/L (ref 98–111)
Creatinine, Ser: 0.69 mg/dL (ref 0.61–1.24)
GFR, Estimated: >60 mL/min (ref >60)
Glucose, Bld: 164 mg/dL — ABNORMAL HIGH (ref 70–99)
Potassium: 3.4 mmol/L — ABNORMAL LOW (ref 3.5–5.1)
Sodium: 134 mmol/L — ABNORMAL LOW (ref 135–145)

## 2024-08-14 MED ORDER — LACTULOSE 10 GM/15ML PO SOLN
20.0000 g | Freq: Two times a day (BID) | ORAL | Status: DC
  Administered 2024-08-14 – 2024-08-15 (×3): 20 g via ORAL
  Filled 2024-08-14 (×3): qty 30

## 2024-08-14 MED ORDER — INSULIN GLARGINE 100 UNIT/ML ~~LOC~~ SOLN
15.0000 [IU] | Freq: Every day | SUBCUTANEOUS | Status: DC
Start: 2024-08-14 — End: 2024-08-15
  Administered 2024-08-14 – 2024-08-15 (×2): 15 [IU] via SUBCUTANEOUS
  Filled 2024-08-14 (×3): qty 0.15

## 2024-08-14 NOTE — Plan of Care (Signed)

## 2024-08-14 NOTE — Progress Notes (Signed)
 [facility] PROGRESS NOTE  Ethan [name]. (DOB: February 18, 1971) FMW:[id number] PCP: [facility]  Brief Narrative: Ethan Mcclish. is a 53 y.o. male with a history of diet-controlled T2DM, HTN, obesity, a single episode of diverticulitis 2013, and gout who presented to the ED on 08/12/2024 with lower abdominal pain. CT confirmed his suspicion of diverticulitis, acute-appearing around the sigmoid colon without abscess. Antibiotics have been started. Metabolic derangements, notably hyperglycemia to 455 was also noted without acidosis. HbA1c was checked, sliding scale insulin  started, diabetes coordinator consulted, and he was admitted to SDU due to hyponatremia (corrected Na 126). Glycemic control has improved, as have metabolic parameters.   Subjective: Abdominal pain is more diffuse, crampy, intermittent, not changed with food. Feels the need to have BM but unable to do so. Family at bedside.  Objective: BP (!) 140/87 (BP Location: Left Arm)   Pulse 86   Temp 98.5 F (36.9 C) (Oral)   Resp 20   Ht 6' 3 (1.905 m)   Wt 131.1 kg   SpO2 95%   BMI 36.13 kg/m   Gen: No distress Pulm: Clear, nonlabored  CV: RRR, no MRG GI: Soft, mildly tender around umbilicus, NOT in LLQ, no rebound, nondistended, hypoactive BS Neuro: Alert and oriented. No new focal deficits. Ext: Warm, no deformities Skin: No rashes, lesions or ulcers on visualized skin   Assessment & Plan: Acute, recurrent sigmoid diverticulitis:  - Continue CTX, flagyl . WBC normalized.  - Continue FLD, do not advance with abdominal discomfort requiring IV morphine .  - Check abd XR. - Continue IV morphine  prn  T2DM: HbA1c 8.7%. Pt previously on oral medications but came off with lifestyle modifications. Has +FH DM early onset in his mother. Has had polyuria/polydipsia this past week as well.  - DM coordinator consulted.  - Continue AC/HS SSI and CBG checks. Augment basal.  - Will plan to initiate  oral agent(s) at discharge and follow up with PCP to avoid overcorrection/hypoglycemia after inflammation resolves. Pt motivated to enact lifestyle modifications.  - On ACEi, statin  Hyponatremia: Improved.  HTN:  - Continue ACEi given Dx DM, continue norvasc   Gout: Quiescent.  - Continue allopurinol .   Ethan KATHEE Come, MD [facility] [email or web address] 08/14/2024, 3:55 PM

## 2024-08-14 NOTE — Progress Notes (Signed)
 Mobility Specialist Progress Note:    08/14/24 1330  Mobility  Activity Ambulated with assistance  Level of Assistance Independent  Assistive Device None  Distance Ambulated (ft) 240 ft  Range of Motion/Exercises Active;All extremities  Activity Response Tolerated well  Mobility Referral Yes  Mobility visit 1 Mobility  Mobility Specialist Start Time (ACUTE ONLY) 1330  Mobility Specialist Stop Time (ACUTE ONLY) 1350  Mobility Specialist Time Calculation (min) (ACUTE ONLY) 20 min   Pt received in bed, family in room. Agreeable to mobility, independently able to stand and ambulate with no AD. Tolerated well, asx throughout. Returned supine, all needs met.  Bula Cavalieri Mobility Specialist Please contact via Special educational needs teacher or  Rehab office at [phone]

## 2024-08-14 NOTE — Inpatient Diabetes Management (Addendum)
 Inpatient Diabetes Program Recommendations  AACE/ADA: New Consensus Statement on Inpatient Glycemic Control   Target Ranges:  Prepandial:   less than 140 mg/dL      Peak postprandial:   less than 180 mg/dL (1-2 hours)      Critically ill patients:  140 - 180 mg/dL    Latest Reference Range & Units 08/13/24 07:43 08/13/24 11:18 08/13/24 16:27 08/13/24 20:11 08/13/24 23:53 08/14/24 03:50 08/14/24 07:17  Glucose-Capillary 70 - 99 mg/dL 802 (H) 784 (H) 661 (H) 357 (H) 229 (H) 173 (H) 157 (H)    Latest Reference Range & Units 08/09/13 12:30 06/25/14 13:03 08/13/24 04:32  Hemoglobin A1C 4.8 - 5.6 % 6.3 (H) 6.0 (H) 8.7 (H)   Review of Glycemic Control  Diabetes history: DM2 (dx 2020) Outpatient Diabetes medications: None Current orders for Inpatient glycemic control: Lantus  5 units daily, Novolog  0-15 units Q4H   Inpatient Diabetes Program Recommendations:     Insulin : Patient has received a total of Novolog  45 units for correction over the past 24 hours.   Please consider increasing Lantus  to 15 units daily, changing CBGs to AC&HS and Novolog  to 0-15 units AC&HS, and ordering Novolog  4 units TID with meals for meal coverage if patient eats at least 50% of meals.  HbgA1C:  A1C 8.7% on 08/13/24 indicating an average glucose of 197 mg/dl.   Outpatient DM: At time of discharge, please provide Rx for glucose monitoring kit (#[id number]) and DM medications.  Addendum 08/14/24@12 :55-Spoke with patient at bedside regarding DM. Discussed glucose trends from yesterday (up to 357 mg/dl last night). Patient reports that he drank sweet tea which made his sugar go up.  Discussed eliminating sugary beverages. Patient has Living Well with DM book at bedside but has no read yet. Encouraged patient to read through book to increase knowledge about DM management.  Discussed A1C of 8.7% indicating an average glucose of 197 mg/dl over the past 2-3 months. Reviewed glucose and A1C goals. Stressed importance of improving  DM control, consistently taking medications as prescribed, following up with PCP routinely, checking glucose as ordered, making lifestyle changes with diet and exercise, and following carb modified diet. Reviewed hypoglycemia along with treatment. Discussed that he will likely be discharged on oral DM medication but I wanted to review insulin  in case it is prescribed at discharge or by his PCP in the future.  Educated patient on insulin  pen use at home.  Reviewed all steps of insulin  pen including attachment of needle, 2-unit air shot, dialing up dose, giving injection, removing needle, disposal of sharps, storage of unused insulin , disposal of insulin  etc. Patient able to provide successful return demonstration. Also reviewed troubleshooting with insulin  pen. Discussed glucose monitoring and informed patient he should get Rx for glucose monitoring kit. Asked that he take his glucometer to follow up appointments with PCP. He has an appointment with a new PCP on August 31, 2024. Patient is hopeful that he can use oral DM medications for DM control but willing to use insulin  if needed. Patient verbalized understanding of information and has no questions at this time.  Thanks, Ethan Gainer, RN, MSN, CDCES Diabetes Coordinator Inpatient Diabetes Program [phone] (Team Pager from 8am to 5pm)

## 2024-08-15 DIAGNOSIS — K5792 Diverticulitis of intestine, part unspecified, without perforation or abscess without bleeding: Secondary | ICD-10-CM | POA: Diagnosis not present

## 2024-08-15 LAB — GLUCOSE, CAPILLARY
Glucose-Capillary: 172 mg/dL — ABNORMAL HIGH (ref 70–99)
Glucose-Capillary: 206 mg/dL — ABNORMAL HIGH (ref 70–99)
Glucose-Capillary: 228 mg/dL — ABNORMAL HIGH (ref 70–99)

## 2024-08-15 MED ORDER — METRONIDAZOLE 500 MG PO TABS: 500.0000 mg | ORAL_TABLET | Freq: Three times a day (TID) | ORAL | 0 refills | Status: AC

## 2024-08-15 MED ORDER — LANCETS MISC: 1.0000 | Freq: Three times a day (TID) | 0 refills | Status: AC

## 2024-08-15 MED ORDER — BLOOD GLUCOSE TEST VI STRP: 1.0000 | ORAL_STRIP | Freq: Three times a day (TID) | 0 refills | Status: AC

## 2024-08-15 MED ORDER — POLYETHYLENE GLYCOL 3350 17 G PO PACK: 17.0000 g | PACK | Freq: Two times a day (BID) | ORAL | 0 refills | Status: DC

## 2024-08-15 MED ORDER — PEN NEEDLES 31G X 5 MM MISC: 1.0000 | Freq: Three times a day (TID) | 0 refills | Status: AC

## 2024-08-15 MED ORDER — METFORMIN HCL 500 MG PO TABS: 500.0000 mg | ORAL_TABLET | Freq: Every day | ORAL | 2 refills | Status: DC

## 2024-08-15 MED ORDER — CIPROFLOXACIN HCL 250 MG PO TABS
500.0000 mg | ORAL_TABLET | Freq: Two times a day (BID) | ORAL | Status: DC
  Administered 2024-08-15: 500 mg via ORAL
  Filled 2024-08-15: qty 2

## 2024-08-15 MED ORDER — METRONIDAZOLE 500 MG PO TABS
500.0000 mg | ORAL_TABLET | Freq: Three times a day (TID) | ORAL | Status: DC
Start: 2024-08-15 — End: 2024-08-15
  Administered 2024-08-15: 500 mg via ORAL
  Filled 2024-08-15 (×2): qty 1

## 2024-08-15 MED ORDER — POTASSIUM CHLORIDE CRYS ER 20 MEQ PO TBCR
20.0000 meq | EXTENDED_RELEASE_TABLET | Freq: Once | ORAL | Status: AC
  Administered 2024-08-15: 20 meq via ORAL
  Filled 2024-08-15: qty 1

## 2024-08-15 MED ORDER — METFORMIN HCL 500 MG PO TABS: 500.0000 mg | ORAL_TABLET | Freq: Every day | ORAL | Status: DC

## 2024-08-15 MED ORDER — INSULIN GLARGINE 100 UNIT/ML SOLOSTAR PEN: 15.0000 [IU] | PEN_INJECTOR | Freq: Every day | SUBCUTANEOUS | 5 refills | Status: DC

## 2024-08-15 MED ORDER — BLOOD GLUCOSE MONITORING SUPPL DEVI: 1.0000 | Freq: Three times a day (TID) | 0 refills | Status: AC

## 2024-08-15 MED ORDER — NICOTINE 21 MG/24HR TD PT24: 21.0000 mg | MEDICATED_PATCH | Freq: Every day | TRANSDERMAL | 0 refills | Status: DC

## 2024-08-15 MED ORDER — CIPROFLOXACIN HCL 500 MG PO TABS: 500.0000 mg | ORAL_TABLET | Freq: Two times a day (BID) | ORAL | 0 refills | Status: AC

## 2024-08-15 MED ORDER — LANCET DEVICE MISC: 1.0000 | Freq: Three times a day (TID) | 0 refills | Status: AC

## 2024-08-15 MED ORDER — LISINOPRIL 10 MG PO TABS
20.0000 mg | ORAL_TABLET | ORAL | Status: DC
  Administered 2024-08-15: 20 mg via ORAL
  Filled 2024-08-15: qty 2

## 2024-08-15 NOTE — Inpatient Diabetes Management (Addendum)
 Inpatient Diabetes Program Recommendations  AACE/ADA: New Consensus Statement on Inpatient Glycemic Control  Target Ranges:  Prepandial:   less than 140 mg/dL      Peak postprandial:   less than 180 mg/dL (1-2 hours)      Critically ill patients:  140 - 180 mg/dL    Latest Reference Range & Units 08/14/24 07:17 08/14/24 11:04 08/14/24 15:59 08/14/24 21:09 08/14/24 23:57 08/15/24 04:30  Glucose-Capillary 70 - 99 mg/dL 842 (H) 752 (H) 721 (H) 258 (H) 334 (H) 172 (H)    Latest Reference Range & Units 08/13/24 04:32  Hemoglobin A1C 4.8 - 5.6 % 8.7 (H)   Review of Glycemic Control  iabetes history: DM2 (dx 2020) Outpatient Diabetes medications: None Current orders for Inpatient glycemic control: Lantus  15 units daily, Novolog  0-15 units Q4H   Inpatient Diabetes Program Recommendations:     Insulin : Patient has received a total of Novolog  38 units for correction over the past 24 hours.   Please consider increasing Lantus  to 20 units daily, changing CBGs to AC&HS and Novolog  to 0-15 units AC&HS, and ordering Novolog  4 units TID with meals for meal coverage if patient eats at least 50% of meals.   HbgA1C:  A1C 8.7% on 08/13/24 indicating an average glucose of 197 mg/dl.   Outpatient DM: At time of discharge, please provide Rx for glucose monitoring kit (#[id number]) and DM medications. If patient were to be discharged on insulin , he has been educated on insulin  pens.  Thanks, Earnie Gainer, RN, MSN, CDCES Diabetes Coordinator Inpatient Diabetes Program [phone] (Team Pager from 8am to 5pm)

## 2024-08-15 NOTE — Discharge Summary (Signed)
 Physician Discharge Summary   Patient: Ethan [name]. MRN: [id number] DOB: January 31, 1971  Admit date:     08/12/2024  Discharge date: 08/15/2024  Discharge Physician: Adriana DELENA Grams   [facility]P: [facility]   Recommendations at discharge:   Follow-up with PCP in 1 week Follow-up with outpatient diabetic coordinator, continue adjusting your insulin  and diabetic medication for optimal glycemic control Continue current antibiotics  Discharge Diagnoses: Principal Problem:   Acute diverticulitis Active Problems:   Hypertension   Tobacco use disorder   Diabetes mellitus (HCC)  Ethan [name]. is a 53 y.o. male with medical history significant for gout, hypertension.  Patient presented to the ED with complaints of lower abdominal pain that started today.  No nausea, vomiting no diarrhea.  No fevers no chills.  Symptoms feel similar to his prior diverticulitis episodes. Reports increased thirst, drinking lots of water  over the past week.  Prior diagnosis of diabetes, he reports family history of diabetes in his mother and grandmother and cousins.  ED Course: Temperature 98.7.  Heart rate 109.  Respiratory 20.  Blood pressure 124/84.  O2 sat 93% on room air.  WBC 10.6.  Sodium 120.  Blood glucose 155.  Anion gap normal at 11, serum bicarb 22. CTAP WC- mild acute sigmoid diverticulitis or colitis.  No abscess. IV ciprofloxacin  and metronidazole  started in ED.  1 L bolus given. 4mg  morphine  given. 5 units NovoLog  given. Hospitalist to admit for acute diverticulitis, hyponatremia and new diagnosis of diabetes mellitus.   * Acute diverticulitis Presenting with lower abdominal pain, CT shows small acute sigmoid diverticulitis/colitis.  Rules out for sepsis.  WBC 10.6.  Afebrile. -1.5 L bolus given, continue N/s 100cc/hr x 20 hrs - Metronidazole  and IV ceftriaxone  ( penicillin allergy- rash noted.) - IV morphine  2 mg every 4 hourly as needed - Bowel rest with clear  liquid diet-diet, was advanced accordingly - Robotic switch to p.o. ciprofloxacin  and Flagyl  total of 10 days of antibiotic coverage - Follow-up with gastroenterologist in 4-6 weeks  Diabetes mellitus (HCC) New diagnosis of diabetes mellitus.  Presenting with increased thirst and polydipsia.  Blood sugar 455.  Not in DKA.  Anion gap of 11, serum bicarb of 22. - SSI- M - HgbA1C 8.7 -DM coordinator consult - Continue patient on long-acting insulin  15 units nightly, metformin  500 mg p.o. twice daily   Hypertension Stable. -Resume Norvasc , holding lisinopril  for contrast exposure      Disposition: Home Diet recommendation:  Discharge Diet Orders (From admission, onward)     Start     Ordered   08/15/24 0000  Diet Carb Modified        08/15/24 1401           Carb modified diet DISCHARGE MEDICATION: Allergies as of 08/15/2024       Reactions   Penicillins Rash        Medication List     TAKE these medications    allopurinol  300 MG tablet Commonly known as: ZYLOPRIM  Take 300 mg by mouth every morning.   amLODipine  10 MG tablet Commonly known as: NORVASC  Take 10 mg by mouth every morning.   Blood Glucose Monitoring Suppl Devi 1 each by Does not apply route 3 (three) times daily. May dispense any manufacturer covered by patient's insurance.   BLOOD GLUCOSE TEST STRIPS Strp 1 each by Does not apply route 3 (three) times daily. Use as directed to check blood sugar. May dispense any manufacturer covered by patient's  insurance and fits patient's device.   ciprofloxacin  500 MG tablet Commonly known as: CIPRO  Take 1 tablet (500 mg total) by mouth 2 (two) times daily for 7 days.   insulin  glargine 100 UNIT/ML Solostar Pen Commonly known as: LANTUS  Inject 15 Units into the skin daily. May substitute as needed per insurance.   Lancet Device Misc 1 each by Does not apply route 3 (three) times daily. May dispense any manufacturer covered by patient's insurance.    Lancets Misc 1 each by Does not apply route 3 (three) times daily. Use as directed to check blood sugar. May dispense any manufacturer covered by patient's insurance and fits patient's device.   lisinopril  20 MG tablet Commonly known as: ZESTRIL  Take 20 mg by mouth every morning.   metFORMIN  500 MG tablet Commonly known as: GLUCOPHAGE  Take 1 tablet (500 mg total) by mouth daily with breakfast. Start taking on: August 16, 2024   metroNIDAZOLE  500 MG tablet Commonly known as: FLAGYL  Take 1 tablet (500 mg total) by mouth every 8 (eight) hours for 7 days.   nicotine  21 mg/24hr patch Commonly known as: NICODERM CQ  - dosed in mg/24 hours Place 1 patch (21 mg total) onto the skin daily. Start taking on: August 16, 2024   omeprazole 40 MG capsule Commonly known as: PRILOSEC Take 40 mg by mouth every morning.   Pen Needles 31G X 5 MM Misc 1 each by Does not apply route 3 (three) times daily. May dispense any manufacturer covered by patient's insurance.   polyethylene glycol 17 g packet Commonly known as: MIRALAX  / GLYCOLAX  Take 17 g by mouth 2 (two) times daily. Hold if develop diarrhea   pravastatin  40 MG tablet Commonly known as: PRAVACHOL  Take 40 mg by mouth every morning.   tadalafil  20 MG tablet Commonly known as: CIALIS  Take 1 tablet (20 mg total) by mouth daily as needed for erectile dysfunction.               Durable Medical Equipment  (From admission, onward)           Start     Ordered   08/15/24 0000  For home use only DME Glucometer        08/15/24 1401            Discharge Exam: Filed Weights   08/12/24 1535 08/12/24 2239  Weight: 131.5 kg 131.1 kg        General:  AAO x 3,  cooperative, no distress;   HEENT:  Normocephalic, PERRL, otherwise with in Normal limits   Neuro:  CNII-XII intact. , normal motor and sensation, reflexes intact   Lungs:   Clear to auscultation BL, Respirations unlabored,  No wheezes / crackles  Cardio:     S1/S2, RRR, No murmure, No Rubs or Gallops   Abdomen:  Soft, non-tender, bowel sounds active all four quadrants, no guarding or peritoneal signs.  Muscular  skeletal:  Limited exam -global generalized weaknesses - in bed, able to move all 4 extremities,   2+ pulses,  symmetric, No pitting edema  Skin:  Dry, warm to touch, negative for any Rashes,  Wounds: Please see nursing documentation          Condition at discharge: good  The results of significant diagnostics from this hospitalization (including imaging, microbiology, ancillary and laboratory) are listed below for reference.   Imaging Studies: DG Abd Portable 1V Result Date: 08/14/2024 EXAM: 1 VIEW XRAY OF THE ABDOMEN 08/14/2024 04:11:00 PM COMPARISON: CT abdomen  and pelvis 08/12/2024 CLINICAL HISTORY: [id number] Abdominal pain [id number]; [id number] Constipation [phone]. Abdominal pain. FINDINGS: BOWEL: Nonobstructive bowel gas pattern. SOFT TISSUES: No opaque urinary calculi. BONES: No acute osseous abnormality. IMPRESSION: 1. No acute findings. Electronically signed by: Donnice Mania MD 08/14/2024 05:01 PM EDT RP Workstation: [id number]   CT ABDOMEN PELVIS W CONTRAST Result Date: 08/12/2024 CLINICAL DATA:  Abdominal pain. EXAM: CT ABDOMEN AND PELVIS WITH CONTRAST TECHNIQUE: Multidetector CT imaging of the abdomen and pelvis was performed using the standard protocol following bolus administration of intravenous contrast. RADIATION DOSE REDUCTION: This exam was performed according to the departmental dose-optimization program which includes automated exposure control, adjustment of the mA and/or kV according to patient size and/or use of iterative reconstruction technique. CONTRAST:  OMNIPAQUE  IOHEXOL  300 MG/ML  SOLN COMPARISON:  CT abdomen pelvis dated 08/04/2024. FINDINGS: Lower chest: The visualized lung bases are clear. No intra-abdominal free air or free fluid. Hepatobiliary: Fatty liver. No biliary dilatation. The gallbladder is  unremarkable. Pancreas: Unremarkable. No pancreatic ductal dilatation or surrounding inflammatory changes. Spleen: Normal in size without focal abnormality. Adrenals/Urinary Tract: The adrenal glands, kidneys, visualized ureters, and urinary bladder unremarkable. Stomach/Bowel: There is sigmoid diverticulosis. There is mild thickened appearance of the sigmoid colon with adjacent stranding, new since the prior CT which may represent mild acute diverticulitis or colitis. There is an area of stranding and haziness extending from the perisigmoid to the left hemipelvis likely extension of inflammatory/infectious process. A focal area of fat infarct is less likely. Clinical correlation and follow-up recommended. No fluid collection or abscess. Small hiatal hernia. There is no bowel obstruction.  The appendix is normal. Vascular/Lymphatic: Mild aortoiliac atherosclerotic disease. The IVC is unremarkable. No portal venous gas. There is no adenopathy. Reproductive: The prostate and seminal vesicles are grossly unremarkable. Other: Small fat containing umbilical hernia. Musculoskeletal: Degenerative changes of the spine. No acute osseous pathology. IMPRESSION: 1. Mild acute sigmoid diverticulitis or colitis. No abscess. 2. Fatty liver. 3.  Aortic Atherosclerosis (ICD10-I70.0). Electronically Signed   By: Vanetta Chou M.D.   On: 08/12/2024 18:34   DG Knee 2 Views Right Result Date: 08/09/2024 EXAM: 1 or 2 VIEW(S) XRAY OF THE KNEE 08/09/2024 11:02:08 AM COMPARISON: None available. CLINICAL HISTORY: Non-traumatic knee pain x 3 months. FINDINGS: BONES AND JOINTS: No acute fracture. No focal osseous lesion. No joint dislocation. No significant joint effusion. Mild medial compartment joint space loss consistent with joint degeneration. SOFT TISSUES: Calcified peripheral vascular disease. IMPRESSION: 1. Mild medial compartment joint space loss consistent with joint degeneration. Electronically signed by: Donnice Mania MD  08/09/2024 11:14 AM EDT RP Workstation: [id number]   CT ABDOMEN PELVIS W CONTRAST Result Date: 08/04/2024 CLINICAL DATA:  Abdominal pain, acute, nonlocalized. Vomiting and coughing. EXAM: CT ABDOMEN AND PELVIS WITH CONTRAST TECHNIQUE: Multidetector CT imaging of the abdomen and pelvis was performed using the standard protocol following bolus administration of intravenous contrast. RADIATION DOSE REDUCTION: This exam was performed according to the departmental dose-optimization program which includes automated exposure control, adjustment of the mA and/or kV according to patient size and/or use of iterative reconstruction technique. CONTRAST:  OMNIPAQUE  IOHEXOL  300 MG/ML  SOLN COMPARISON:  12/13/2023 FINDINGS: Lower chest: Subtle patchy densities in the right middle lobe. Slightly limited evaluation of the right middle lobe due to motion artifact. 5 mm nodular density in the right middle lobe on image 6/3. Hepatobiliary: Diffuse low-density in the liver is suggestive for hepatic steatosis. Normal appearance of the gallbladder. No biliary dilatation. Pancreas: Fat stranding around  the duodenum and pancreatic head. No evidence for pancreatic duct dilatation. Mild heterogeneity in the pancreatic head on image 44/2. Findings are concerning for acute pancreatitis. Spleen: Normal in size without focal abnormality. Adrenals/Urinary Tract: Normal adrenal glands. Small hypodensities in the right kidney that are too small to definitively characterize. No suspicious renal lesions. Negative for hydronephrosis. Normal urinary bladder. Stomach/Bowel: Probable small hiatal hernia. Otherwise, normal appearance of the stomach. Question mild wall thickening in the duodenum near the pancreatic head. No small bowel dilatation. Scattered colonic diverticula without acute inflammation. Normal appendix. No evidence for bowel obstruction. Vascular/Lymphatic: Aortic atherosclerosis. No enlarged abdominal or pelvic lymph nodes.  Reproductive: Prostate is unremarkable. Other: Negative for free fluid. Negative for free air. Umbilical hernia containing fat. Musculoskeletal: Degenerative facet disease in lumbar spine. No acute bone abnormality. IMPRESSION: 1. Inflammatory changes around the pancreatic head and duodenum. Findings are concerning for acute pancreatitis. 2. Hepatic steatosis. 3. Subtle patchy densities in the right middle lobe. Findings are nonspecific but could be related to an infectious or inflammatory process. 4. 5 mm right solid pulmonary nodule. No follow-up needed if patient is low-risk. Non-contrast chest CT can be considered in 12 months if patient is high-risk. This recommendation follows the consensus statement: Guidelines for Management of Incidental Pulmonary Nodules Detected on CT Images: From the Fleischner Society 2017; Radiology 2017; 284:228-243. 5. Aortic Atherosclerosis (ICD10-I70.0). Electronically Signed   By: Juliene Balder M.D.   On: 08/04/2024 10:08    Microbiology: Results for orders placed or performed during the hospital encounter of 08/12/24  MRSA Next Gen by PCR, Nasal     Status: None   Collection Time: 08/12/24 10:09 PM   Specimen: Nasal Mucosa; Nasal Swab  Result Value Ref Range Status   MRSA by PCR Next Gen NOT DETECTED NOT DETECTED Final    Comment: (NOTE) The GeneXpert MRSA Assay (FDA approved for NASAL specimens only), is one component of a comprehensive MRSA colonization surveillance program. It is not intended to diagnose MRSA infection nor to guide or monitor treatment for MRSA infections. Test performance is not FDA approved in patients less than 35 years old. Performed at [facility], [address] [place], [place] [address]     Labs: CBC: Recent Labs  Lab 08/12/24 1537 08/13/24 0432 08/14/24 0441  WBC 10.6* 11.2* 9.7  HGB 13.5 11.9* 11.9*  HCT 39.0 36.1* 36.4*  MCV 97.5 100.8* 101.4*  PLT 238 219 232   Basic Metabolic Panel: Recent Labs  Lab 08/12/24 1537  08/13/24 0432 08/13/24 1948 08/14/24 0441  NA 120* 135 134* 134*  K 4.0 4.2 4.0 3.4*  CL 87* 104 96* 103  CO2 22 24 23 24   GLUCOSE 455* 184* 371* 164*  BUN 20 14 9 7   CREATININE 1.06 0.78 9.02 0.69  CALCIUM 8.4* 8.4* 9.1 8.3*   Liver Function Tests: Recent Labs  Lab 08/12/24 1537  AST 27  ALT 31  ALKPHOS 92  BILITOT 1.0  PROT 7.9  ALBUMIN 3.7   CBG: Recent Labs  Lab 08/14/24 2109 08/14/24 2357 08/15/24 0430 08/15/24 0730 08/15/24 1143  GLUCAP 258* 334* 172* 206* 228*    Discharge time spent: greater than 30 minutes.  Signed: Adriana DELENA Grams, MD [facility] 08/15/2024

## 2024-08-15 NOTE — Plan of Care (Signed)
  Problem: Education: Goal: Knowledge of General Education information will improve Description: Including pain rating scale, medication(s)/side effects and non-pharmacologic comfort measures Outcome: Progressing   Problem: Health Behavior/Discharge Planning: Goal: Ability to manage health-related needs will improve Outcome: Progressing   Problem: Clinical Measurements: Goal: Ability to maintain clinical measurements within normal limits will improve Outcome: Progressing Goal: Will remain free from infection Outcome: Progressing Goal: Diagnostic test results will improve Outcome: Progressing Goal: Respiratory complications will improve Outcome: Progressing Goal: Cardiovascular complication will be avoided Outcome: Progressing   Problem: Activity: Goal: Risk for activity intolerance will decrease Outcome: Progressing   Problem: Nutrition: Goal: Adequate nutrition will be maintained Outcome: Progressing   Problem: Coping: Goal: Level of anxiety will decrease Outcome: Progressing   Problem: Elimination: Goal: Will not experience complications related to bowel motility Outcome: Progressing Goal: Will not experience complications related to urinary retention Outcome: Progressing   Problem: Pain Managment: Goal: General experience of comfort will improve and/or be controlled Outcome: Progressing   Problem: Safety: Goal: Ability to remain free from injury will improve Outcome: Progressing   Problem: Skin Integrity: Goal: Risk for impaired skin integrity will decrease Outcome: Progressing   Problem: Education: Goal: Ability to describe self-care measures that may prevent or decrease complications (Diabetes Survival Skills Education) will improve Outcome: Progressing Goal: Individualized Educational Video(s) Outcome: Progressing   Problem: Coping: Goal: Ability to adjust to condition or change in health will improve Outcome: Progressing   Problem: Fluid  Volume: Goal: Ability to maintain a balanced intake and output will improve Outcome: Progressing   Problem: Health Behavior/Discharge Planning: Goal: Ability to identify and utilize available resources and services will improve Outcome: Progressing Goal: Ability to manage health-related needs will improve Outcome: Progressing

## 2024-08-22 ENCOUNTER — Encounter: Payer: Self-pay | Admitting: Emergency Medicine

## 2024-08-22 ENCOUNTER — Ambulatory Visit
Admission: EM | Admit: 2024-08-22 | Discharge: 2024-08-22 | Disposition: A | Discharge status: Home / Self Care | Payer: MEDICAID | Attending: Family Medicine | Admitting: Family Medicine

## 2024-08-22 ENCOUNTER — Ambulatory Visit (INDEPENDENT_AMBULATORY_CARE_PROVIDER_SITE_OTHER): Payer: MEDICAID

## 2024-08-22 DIAGNOSIS — S62620B Displaced fracture of medial phalanx of right index finger, initial encounter for open fracture: Secondary | ICD-10-CM

## 2024-08-22 MED ORDER — CHLORHEXIDINE GLUCONATE 4 % EX SOLN
Freq: Every day | CUTANEOUS | 0 refills | Status: DC | PRN
Start: 2024-08-22 — End: 2024-10-08

## 2024-08-22 MED ORDER — MUPIROCIN 2 % EX OINT
1.0000 | TOPICAL_OINTMENT | Freq: Two times a day (BID) | CUTANEOUS | 0 refills | Status: DC
Start: 2024-08-22 — End: 2024-10-08

## 2024-08-22 MED ORDER — BACITRACIN 500 UNIT/GM EX OINT
1.0000 | TOPICAL_OINTMENT | Freq: Two times a day (BID) | CUTANEOUS | Status: DC
  Administered 2024-08-22: 1 via TOPICAL

## 2024-08-22 MED ORDER — SULFAMETHOXAZOLE-TRIMETHOPRIM 800-160 MG PO TABS: 1.0000 | ORAL_TABLET | Freq: Two times a day (BID) | ORAL | 0 refills | Status: AC

## 2024-08-22 NOTE — Discharge Instructions (Signed)
 I have started you on a course of antibiotics and have sent in some wound solution and ointment to dress the area with once to twice daily.  Remain in the finger splint until otherwise told by orthopedics.  Elevate at rest to help with swelling, over-the-counter pain relievers as needed for pain.  It is imperative given the type of injury that you follow-up with orthopedics as soon as possible, hopefully today or at the latest tomorrow.  I have placed an resources in the follow-up section and recommend calling as soon as you leave here today.  You may of course follow-up at what ever orthopedics clinic you feel most comfortable.

## 2024-08-22 NOTE — ED Provider Notes (Signed)
 [facility]    CSN: [id number] Arrival date & time: 08/22/24  0845      History   Chief Complaint No chief complaint on file.   HPI Ethan Norem. is a 53 y.o. male.   Patient presenting today with 1 day history of right index finger pain after the finger was caught in a chain yesterday.  He states the area has been bleeding intermittently, swollen and he has some difficulty moving the finger.  Cleaned the area and put on Neosporin last night, otherwise has not tried anything for symptoms.  He states his tetanus shot was around 2022 or 2023.    Past Medical History:  Diagnosis Date   Asthma    Cellulitis    hospitalized 07/2013   Diverticulitis    06/2012   Diverticulitis    GERD (gastroesophageal reflux disease)    Gout    History of cellulitis    Hypercholesterolemia    Hypertension    Hypokalemia    Microscopic hematuria    Pneumonia    Tobacco use    Urethral stricture     Patient Active Problem List   Diagnosis Date Noted   Acute diverticulitis 08/12/2024   Diabetes mellitus (HCC) 08/12/2024   High cholesterol 12/21/2023   Elevated glucose 06/25/2014   GERD (gastroesophageal reflux disease) 11/09/2013   Intractable hiccups 11/09/2013   Cellulitis and abscess of leg, except foot 08/11/2013   Acute gouty arthritis 08/10/2013   Hypokalemia 08/09/2013   Tobacco use disorder 08/09/2013   Hypertension 07/14/2011    Past Surgical History:  Procedure Laterality Date   ARM WOUND REPAIR / CLOSURE     COLONOSCOPY WITH PROPOFOL  N/A 12/12/2023   Procedure: COLONOSCOPY WITH PROPOFOL ;  Surgeon: Shaaron Lamar HERO, MD;  Location: [facility];  Service: Endoscopy;  Laterality: N/A;  7:30 am, asa 2   CYSTOSCOPY WITH URETHRAL DILATATION N/A 04/05/2024   Procedure: CYSTOSCOPY, WITH URETHRAL DILATION;  Surgeon: Penne Knee, MD;  Location: [facility];  Service: Urology;  Laterality: N/A;  BALLOON DILATION USING OPTILUME   ESOPHAGOGASTRODUODENOSCOPY N/A  11/14/2013   MFM:Ypjujo hernia; otherwise negative examination   WRIST ARTHROCENTESIS     WRIST SURGERY  12/2013   right       Home Medications    Prior to Admission medications   Medication Sig Start Date End Date Taking? Authorizing Provider  chlorhexidine  (HIBICLENS ) 4 % external liquid Apply topically daily as needed. 08/22/24  Yes Stuart Vernell Norris, PA-C  mupirocin  ointment (BACTROBAN ) 2 % Apply 1 Application topically 2 (two) times daily. 08/22/24  Yes Stuart Vernell Norris, PA-C  sulfamethoxazole -trimethoprim  (BACTRIM  DS) 800-160 MG tablet Take 1 tablet by mouth 2 (two) times daily for 7 days. 08/22/24 08/29/24 Yes Stuart Vernell Norris, PA-C  allopurinol  (ZYLOPRIM ) 300 MG tablet Take 300 mg by mouth every morning. 01/24/24   [provider]  amLODipine  (NORVASC ) 10 MG tablet Take 10 mg by mouth every morning.    [provider]  Blood Glucose Monitoring Suppl DEVI 1 each by Does not apply route 3 (three) times daily. May dispense any manufacturer covered by patient's insurance. 08/15/24   Shahmehdi, Adriana LABOR, MD  ciprofloxacin  (CIPRO ) 500 MG tablet Take 1 tablet (500 mg total) by mouth 2 (two) times daily for 7 days. 08/15/24 08/22/24  Shahmehdi, Adriana LABOR, MD  Glucose Blood (BLOOD GLUCOSE TEST STRIPS) STRP 1 each by Does not apply route 3 (three) times daily. Use as directed to check blood sugar. May  dispense any manufacturer covered by patient's insurance and fits patient's device. 08/15/24   Shahmehdi, Seyed A, MD  insulin  glargine (LANTUS ) 100 UNIT/ML Solostar Pen Inject 15 Units into the skin daily. May substitute as needed per insurance. 08/15/24   Shahmehdi, Adriana LABOR, MD  Insulin  Pen Needle (PEN NEEDLES) 31G X 5 MM MISC 1 each by Does not apply route 3 (three) times daily. May dispense any manufacturer covered by patient's insurance. 08/15/24   ShahmehdiAdriana LABOR, MD  Lancet Device MISC 1 each by Does not apply route 3 (three) times daily. May dispense any manufacturer  covered by patient's insurance. 08/15/24   Shahmehdi, Adriana LABOR, MD  Lancets MISC 1 each by Does not apply route 3 (three) times daily. Use as directed to check blood sugar. May dispense any manufacturer covered by patient's insurance and fits patient's device. 08/15/24   Shahmehdi, Adriana LABOR, MD  lisinopril  (ZESTRIL ) 20 MG tablet Take 20 mg by mouth every morning.    [provider]  metFORMIN  (GLUCOPHAGE ) 500 MG tablet Take 1 tablet (500 mg total) by mouth daily with breakfast. 08/16/24 11/14/24  Shahmehdi, Adriana LABOR, MD  metroNIDAZOLE  (FLAGYL ) 500 MG tablet Take 1 tablet (500 mg total) by mouth every 8 (eight) hours for 7 days. 08/15/24 08/22/24  ShahmehdiAdriana LABOR, MD  nicotine  (NICODERM CQ  - DOSED IN MG/24 HOURS) 21 mg/24hr patch Place 1 patch (21 mg total) onto the skin daily. 08/16/24   Shahmehdi, Seyed A, MD  omeprazole (PRILOSEC) 40 MG capsule Take 40 mg by mouth every morning.    [provider]  polyethylene glycol (MIRALAX  / GLYCOLAX ) 17 g packet Take 17 g by mouth 2 (two) times daily. Hold if develop diarrhea 08/15/24   Shahmehdi, Adriana LABOR, MD  pravastatin  (PRAVACHOL ) 40 MG tablet Take 40 mg by mouth every morning.    [provider]  tadalafil  (CIALIS ) 20 MG tablet Take 1 tablet (20 mg total) by mouth daily as needed for erectile dysfunction. 06/06/24   Vaillancourt, Samantha, PA-C    Family History Family History  Problem Relation Age of Onset   Colon cancer Neg Hx     Social History Social History   Tobacco Use   Smoking status: Every Day    Current packs/day: 0.50    Average packs/day: 0.5 packs/day for 35.7 years (17.9 ttl pk-yrs)    Types: Cigarettes    Start date: 2   Smokeless tobacco: Never   Tobacco comments:    1/2 pack daily  Vaping Use   Vaping status: Never Used  Substance Use Topics   Alcohol use: Yes    Alcohol/week: 1.0 standard drink of alcohol    Types: 1 Cans of beer per week    Comment: heavy use on weekends   Drug use: Never      Allergies   Penicillins   Review of Systems Review of Systems Per HPI  Physical Exam Triage Vital Signs ED Triage Vitals  Encounter Vitals Group     BP 08/22/24 0854 125/86     Girls Systolic BP Percentile --      Girls Diastolic BP Percentile --      Boys Systolic BP Percentile --      Boys Diastolic BP Percentile --      Pulse Rate 08/22/24 0854 90     Resp 08/22/24 0854 20     Temp 08/22/24 0854 98.1 F (36.7 C)     Temp Source 08/22/24 0854 Oral  SpO2 08/22/24 0854 92 %     Weight --      Height --      Head Circumference --      Peak Flow --      Pain Score 08/22/24 0856 8     Pain Loc --      Pain Education --      Exclude from Growth Chart --    No data found.  Updated Vital Signs BP 125/86 (BP Location: Left Arm)   Pulse 90   Temp 98.1 F (36.7 C) (Oral)   Resp 20   SpO2 92%   Visual Acuity Right Eye Distance:   Left Eye Distance:   Bilateral Distance:    Right Eye Near:   Left Eye Near:    Bilateral Near:     Physical Exam Vitals and nursing note reviewed.  Constitutional:      Appearance: Normal appearance.  HENT:     Head: Atraumatic.  Eyes:     Extraocular Movements: Extraocular movements intact.     Conjunctiva/sclera: Conjunctivae normal.  Cardiovascular:     Rate and Rhythm: Normal rate.  Pulmonary:     Effort: Pulmonary effort is normal.  Musculoskeletal:        General: Swelling, tenderness and signs of injury present. Normal range of motion.     Cervical back: Normal range of motion and neck supple.  Skin:    General: Skin is warm.     Comments: Semicircular laceration present to the palmar aspect of the right index finger.  Minimal range of motion to the middle and distal phalanx of the right index finger.  Significant tenderness to palpation to this area  Neurological:     Mental Status: He is oriented to person, place, and time.     Motor: No weakness.     Gait: Gait normal.     Comments: Right upper extremity  neurovascularly intact  Psychiatric:        Mood and Affect: Mood normal.        Thought Content: Thought content normal.        Judgment: Judgment normal.      UC Treatments / Results  Labs (all labs ordered are listed, but only abnormal results are displayed) Labs Reviewed - No data to display  EKG   Radiology DG Finger Index Right Result Date: 08/22/2024 CLINICAL DATA:  Pain and swelling in the right index finger which was caught in a chain yesterday EXAM: RIGHT INDEX FINGER 2+V COMPARISON:  06/02/2015 FINDINGS: Oblique fracture extending from the dorsal proximal metaphysis to the palmar distal metadiaphysis with suspected comminution anteriorly. No well-defined extension to an articular surface although assessment in this regard is mildly limited due to the finger not being held orthogonal to the x-ray beam Old healed deformity of the distal middle finger metacarpal. Volar-radial linear likely metal density along the proximal index finger, not changed from 06/02/2015. Degenerative arthropathy at the first MCP joint. IMPRESSION: 1. Acute oblique fracture of the index finger proximal phalanx with suspected comminution anteriorly. No well-defined extension to an articular surface although assessment in this regard is mildly limited due to the finger not being held orthogonal to the x-ray beam on frontal and oblique projections. 2. Old healed deformity of the distal middle finger metacarpal. 3. Degenerative arthropathy at the thumb MCP joint. Electronically Signed   By: Ryan Salvage M.D.   On: 08/22/2024 09:45    Procedures Procedures (including critical care time)  Medications Ordered  in UC Medications  bacitracin  ointment 1 Application (1 Application Topical Given 08/22/24 0950)    Initial Impression / Assessment and Plan / UC Course  I have reviewed the triage vital signs and the nursing notes.  Pertinent labs & imaging results that were available during my care of the patient  were reviewed by me and considered in my medical decision making (see chart for details).     Wound thoroughly cleaned, bacitracin  and nonstick dressing applied and finger splint placed for protection.  X-ray of the finger today showing an oblique fracture of the right index finger with suspected comminution.  He states his tetanus shot is up-to-date and declines a repeat today.  Discussed importance of immediate orthopedic follow-up as this is considered an open fracture and resources were given.  Final Clinical Impressions(s) / UC Diagnoses   Final diagnoses:  Open displaced fracture of middle phalanx of right index finger, initial encounter     Discharge Instructions      I have started you on a course of antibiotics and have sent in some wound solution and ointment to dress the area with once to twice daily.  Remain in the finger splint until otherwise told by orthopedics.  Elevate at rest to help with swelling, over-the-counter pain relievers as needed for pain.  It is imperative given the type of injury that you follow-up with orthopedics as soon as possible, hopefully today or at the latest tomorrow.  I have placed an resources in the follow-up section and recommend calling as soon as you leave here today.  You may of course follow-up at what ever orthopedics clinic you feel most comfortable.    ED Prescriptions     Medication Sig Dispense Auth. Provider   sulfamethoxazole -trimethoprim  (BACTRIM  DS) 800-160 MG tablet Take 1 tablet by mouth 2 (two) times daily for 7 days. 14 tablet Stuart Millman Farson, PA-C   chlorhexidine  (HIBICLENS ) 4 % external liquid Apply topically daily as needed. 236 mL Stuart Millman Norris, PA-C   mupirocin  ointment (BACTROBAN ) 2 % Apply 1 Application topically 2 (two) times daily. 60 g Stuart Millman Norris, [place]      PDMP not reviewed this encounter.   Stuart Millman Norris, PA-C 08/22/24 1359

## 2024-08-22 NOTE — ED Triage Notes (Addendum)
 Right index finger was caught in a chain yesterday.  States he saw pus drainage coming out of top of finger this morning.  Laceration on bottom on right index finger.  Finger swollen.  Last tetanus was about a year ago.

## 2024-08-24 ENCOUNTER — Ambulatory Visit: Payer: MEDICAID | Admitting: Orthopedic Surgery

## 2024-08-24 VITALS — BP 130/75 | HR 99 | Ht 75.0 in | Wt 282.0 lb

## 2024-08-24 DIAGNOSIS — S62620A Displaced fracture of medial phalanx of right index finger, initial encounter for closed fracture: Secondary | ICD-10-CM

## 2024-08-24 MED ORDER — HYDROCODONE-ACETAMINOPHEN 5-325 MG PO TABS: 1.0000 | ORAL_TABLET | Freq: Four times a day (QID) | ORAL | 0 refills | Status: DC | PRN

## 2024-08-24 NOTE — Progress Notes (Signed)
 New Patient Visit  Assessment: Ethan [name]. is a 53 y.o. male with the following: 1. Closed displaced fracture of middle phalanx of right index finger, initial encounter  Plan: [name] sustained a comminuted middle phalanx fracture of the right index finger, with soft tissue damage in this area.  Overall, it is healthy at this time.  Low concern for an infection.  No obvious rotational deformity.  Index and long finger buddy taped together.  Splint has been replaced.  Continue to take the antibiotics provided by the urgent care facility.  I do believe this can be treated without surgery.  He is aware that he may develop stiffness.  Limited prescription for narcotics provided.  Follow-up: Return in about 2 weeks (around 09/07/2024).  Subjective:  Chief Complaint  Patient presents with   Fracture    R index finger DOI 08/21/24    History of Present Illness: Ethan [name]. is a 53 y.o. male who presents for evaluation of right index finger pain.  He was working in a garage, when a chain caught his finger and twisted the finger.  There was a soft tissue injury.  He presented to an urgent care center.  He states that there is a flap of skin, and he could see bone.  Radiographs obtained in the urgent care demonstrate a comminuted fracture of the middle phalanx.  He has been taking antibiotics.   Review of Systems: No fevers or chills No numbness or tingling No chest pain No shortness of breath No bowel or bladder dysfunction No GI distress No headaches   Medical History:  Past Medical History:  Diagnosis Date   Asthma    Cellulitis    hospitalized 07/2013   Diverticulitis    06/2012   Diverticulitis    GERD (gastroesophageal reflux disease)    Gout    History of cellulitis    Hypercholesterolemia    Hypertension    Hypokalemia    Microscopic hematuria    Pneumonia    Tobacco use    Urethral stricture     Past Surgical History:  Procedure  Laterality Date   ARM WOUND REPAIR / CLOSURE     COLONOSCOPY WITH PROPOFOL  N/A 12/12/2023   Procedure: COLONOSCOPY WITH PROPOFOL ;  Surgeon: Shaaron Lamar HERO, MD;  Location: [facility];  Service: Endoscopy;  Laterality: N/A;  7:30 am, asa 2   CYSTOSCOPY WITH URETHRAL DILATATION N/A 04/05/2024   Procedure: CYSTOSCOPY, WITH URETHRAL DILATION;  Surgeon: Penne Knee, MD;  Location: [facility];  Service: Urology;  Laterality: N/A;  BALLOON DILATION USING OPTILUME   ESOPHAGOGASTRODUODENOSCOPY N/A 11/14/2013   MFM:Ypjujo hernia; otherwise negative examination   WRIST ARTHROCENTESIS     WRIST SURGERY  12/2013   right    Family History  Problem Relation Age of Onset   Colon cancer Neg Hx    Social History   Tobacco Use   Smoking status: Every Day    Current packs/day: 0.50    Average packs/day: 0.5 packs/day for 35.7 years (17.9 ttl pk-yrs)    Types: Cigarettes    Start date: 26   Smokeless tobacco: Never   Tobacco comments:    1/2 pack daily  Vaping Use   Vaping status: Never Used  Substance Use Topics   Alcohol use: Yes    Alcohol/week: 1.0 standard drink of alcohol    Types: 1 Cans of beer per week    Comment: heavy use on weekends   Drug use:  Never    Allergies  Allergen Reactions   Penicillins Rash    Current Meds  Medication Sig   HYDROcodone -acetaminophen  (NORCO/VICODIN) 5-325 MG tablet Take 1 tablet by mouth every 6 (six) hours as needed.    Objective: BP 130/75   Pulse 99   Ht 6' 3 (1.905 m)   Wt 282 lb (127.9 kg)   BMI 35.25 kg/m   Physical Exam:  General: Alert and oriented. and No acute distress. Gait: Normal gait.  Right index finger with general swelling.  Brisk capillary refill.  There is a volar base lesion, without purulence.  No drainage is appreciated.  Sensation intact to the radial and ulnar aspect of the finger.  IMAGING: I personally reviewed images previously obtained from the ED  Radiographs were available in clinic today.   Comminuted fracture of the middle phalanx.  No obvious intra-articular involvement.  Minimal displacement overall.  No angulation.   New Medications:  Meds ordered this encounter  Medications   HYDROcodone -acetaminophen  (NORCO/VICODIN) 5-325 MG tablet    Sig: Take 1 tablet by mouth every 6 (six) hours as needed.    Dispense:  20 tablet    Refill:  0      [name], MD  08/24/2024 10:21 AM

## 2024-08-26 ENCOUNTER — Encounter: Payer: Self-pay | Admitting: Orthopedic Surgery

## 2024-08-31 ENCOUNTER — Ambulatory Visit: Payer: MEDICAID | Admitting: Internal Medicine

## 2024-09-07 ENCOUNTER — Other Ambulatory Visit (INDEPENDENT_AMBULATORY_CARE_PROVIDER_SITE_OTHER): Payer: MEDICAID

## 2024-09-07 ENCOUNTER — Ambulatory Visit: Payer: MEDICAID | Admitting: Orthopedic Surgery

## 2024-09-07 ENCOUNTER — Encounter: Payer: Self-pay | Admitting: Orthopedic Surgery

## 2024-09-07 DIAGNOSIS — S62620D Displaced fracture of medial phalanx of right index finger, subsequent encounter for fracture with routine healing: Secondary | ICD-10-CM

## 2024-09-07 DIAGNOSIS — S62620A Displaced fracture of medial phalanx of right index finger, initial encounter for closed fracture: Secondary | ICD-10-CM

## 2024-09-07 MED ORDER — HYDROCODONE-ACETAMINOPHEN 5-325 MG PO TABS: 1.0000 | ORAL_TABLET | Freq: Four times a day (QID) | ORAL | 0 refills | Status: DC | PRN

## 2024-09-07 NOTE — Progress Notes (Signed)
 Return patient Visit  Assessment: Ethan [name]. is a 53 y.o. male with the following: 1. Closed displaced fracture of middle phalanx of right index finger, subsequent encounter  Plan: [name] sustained injury to the right index finger.  He has a comminuted fracture of the middle phalanx.  Radiographs remain unchanged.  He has healed the soft tissue wound.  He still has considerable soft tissue swelling.  Elevate the hand to help with swelling.  Medications have been provided.  Continue to mobilize the finger.  Once again, I stressed the timeline, as well as the possibility of him having a stiff finger.  He states understanding.  He will follow-up in 2 weeks.  Follow-up: Return in about 2 weeks (around 09/21/2024).  Subjective:  Chief Complaint  Patient presents with   Fracture    R index finger DOI 08/21/24. Pt states finger is still painful and swollen. Has finished antibiotics, and is still icing/splinting finger.     History of Present Illness: Ethan [name]. is a 53 y.o. male who returns for evaluation of right index finger pain.  He was working in a garage, when a chain caught his finger and twisted the finger.  Injury was sustained a little over 2 weeks ago.  He continues to have pain.  He notes a lot of swelling.  He has completed his antibiotics.  He is not noticed any drainage.  Review of Systems: No fevers or chills No numbness or tingling No chest pain No shortness of breath No bowel or bladder dysfunction No GI distress No headaches    Objective: There were no vitals taken for this visit.  Physical Exam:  General: Alert and oriented. and No acute distress. Gait: Normal gait.  Right index finger with continued swelling.  Sensation is intact distally.  He is tender to palpation.  He has good range of motion at the MCP joint.  He has stiffness in the remaining joints of the index finger.  Volar soft tissue injury is healed.  No surrounding  erythema or drainage.  No concern for an infection at this time.  IMAGING: I personally ordered and reviewed the following images  X-rays of the right index finger were obtained in clinic today.  These are compared to prior x-rays.  Comminuted fracture of the middle phalanx, without obvious intra-articular involvement.  There are multiple fragments, but the fractures remain unchanged.  No dislocation.  No additional injuries.  No bony lesions.  Impression: Stable right index finger, comminuted middle phalanx fracture  New Medications:  Meds ordered this encounter  Medications   HYDROcodone -acetaminophen  (NORCO/VICODIN) 5-325 MG tablet    Sig: Take 1 tablet by mouth every 6 (six) hours as needed.    Dispense:  20 tablet    Refill:  0      [name], MD  09/07/2024 10:58 AM

## 2024-09-14 ENCOUNTER — Emergency Department (HOSPITAL_COMMUNITY)
Admission: EM | Admit: 2024-09-14 | Discharge: 2024-09-14 | Disposition: A | Discharge status: Home / Self Care | Payer: MEDICAID | Attending: Emergency Medicine | Admitting: Emergency Medicine

## 2024-09-14 ENCOUNTER — Other Ambulatory Visit: Payer: Self-pay

## 2024-09-14 ENCOUNTER — Encounter (HOSPITAL_COMMUNITY): Payer: Self-pay

## 2024-09-14 DIAGNOSIS — E119 Type 2 diabetes mellitus without complications: Secondary | ICD-10-CM | POA: Insufficient documentation

## 2024-09-14 DIAGNOSIS — E860 Dehydration: Secondary | ICD-10-CM | POA: Insufficient documentation

## 2024-09-14 DIAGNOSIS — Z794 Long term (current) use of insulin: Secondary | ICD-10-CM | POA: Insufficient documentation

## 2024-09-14 DIAGNOSIS — Z7984 Long term (current) use of oral hypoglycemic drugs: Secondary | ICD-10-CM | POA: Insufficient documentation

## 2024-09-14 DIAGNOSIS — R42 Dizziness and giddiness: Secondary | ICD-10-CM

## 2024-09-14 LAB — BASIC METABOLIC PANEL WITH GFR
Anion gap: 17 — ABNORMAL HIGH (ref 5–15)
BUN: 20 mg/dL (ref 6–20)
CO2: 19 mmol/L — ABNORMAL LOW (ref 22–32)
Calcium: 9.2 mg/dL (ref 8.9–10.3)
Chloride: 102 mmol/L (ref 98–111)
Creatinine, Ser: 0.92 mg/dL (ref 0.61–1.24)
GFR, Estimated: >60 mL/min (ref >60)
Glucose, Bld: 104 mg/dL — ABNORMAL HIGH (ref 70–99)
Potassium: 4.3 mmol/L (ref 3.5–5.1)
Sodium: 138 mmol/L (ref 135–145)

## 2024-09-14 LAB — CBC WITH DIFFERENTIAL/PLATELET
Basophils Absolute: 0 K/uL (ref 0.0–0.1)
Basophils Relative: 0 %
Eosinophils Absolute: 0 K/uL (ref 0.0–0.5)
Eosinophils Relative: 0 %
HCT: 43.4 % (ref 39.0–52.0)
Hemoglobin: 14.3 g/dL (ref 13.0–17.0)
Lymphocytes Relative: 21 %
Lymphs Abs: 1.5 K/uL (ref 0.7–4.0)
MCH: 33.6 pg (ref 26.0–34.0)
MCHC: 32.9 g/dL (ref 30.0–36.0)
MCV: 101.9 fL — ABNORMAL HIGH (ref 80.0–100.0)
Monocytes Absolute: 0.2 K/uL (ref 0.1–1.0)
Monocytes Relative: 3 %
Neutro Abs: 5.5 K/uL (ref 1.7–7.7)
Neutrophils Relative %: 76 %
Platelets: 221 K/uL (ref 150–400)
RBC: 4.26 MIL/uL (ref 4.22–5.81)
RDW: 14.6 % (ref 11.5–15.5)
Smear Review: NORMAL
WBC: 7.2 K/uL (ref 4.0–10.5)
nRBC: 0 % (ref 0.0–0.2)

## 2024-09-14 LAB — CBG MONITORING, ED: Glucose-Capillary: 111 mg/dL — ABNORMAL HIGH (ref 70–99)

## 2024-09-14 MED ORDER — LACTATED RINGERS IV BOLUS
1000.0000 mL | Freq: Once | INTRAVENOUS | Status: AC
  Administered 2024-09-14: 1000 mL via INTRAVENOUS

## 2024-09-14 NOTE — Discharge Instructions (Addendum)
 You were seen for your lightheadedness in the emergency department.   At home, please still hydrating.    Check your MyChart online for the results of any tests that had not resulted by the time you left the emergency department.   Follow-up with your primary doctor in 2-3 days regarding your visit.    Return immediately to the emergency department if you experience any of the following: Worsening dizziness, fainting, or any other concerning symptoms.    Thank you for visiting our Emergency Department. It was a pleasure taking care of you today.

## 2024-09-14 NOTE — ED Triage Notes (Signed)
 Pt c/o dizziness this morning. NIH negative during triage. Pt also report rt pointer finger swelling has been injured for two weeks.

## 2024-09-14 NOTE — ED Notes (Signed)
 Dr. Yolande in triage assessing pt at this time.

## 2024-09-14 NOTE — ED Provider Notes (Signed)
 [place] EMERGENCY DEPARTMENT AT [facility] Provider Note   CSN: [id number] Arrival date & time: 09/14/24  9068     Patient presents with: Dizziness   Ethan Littler. is a 53 y.o. male.   53 year old male with history of diabetes who presents to the emergency department with lightheadedness.  Went to bed at 11 PM and felt normal.  Says that he woke up this morning at 8:30 AM to go to the restroom and felt very lightheaded.  Had some spots in his vision.  Nearly lost consciousness.  No room spinning sensation.  Worsened when he stands up.  Mildly present at rest.  Denies any other recent illnesses.  Normal p.o. intake.       Prior to Admission medications   Medication Sig Start Date End Date Taking? Authorizing Provider  allopurinol  (ZYLOPRIM ) 300 MG tablet Take 300 mg by mouth every morning. 01/24/24   [provider]  amLODipine  (NORVASC ) 10 MG tablet Take 10 mg by mouth every morning.    [provider]  Blood Glucose Monitoring Suppl DEVI 1 each by Does not apply route 3 (three) times daily. May dispense any manufacturer covered by patient's insurance. 08/15/24   Shahmehdi, Adriana LABOR, MD  chlorhexidine  (HIBICLENS ) 4 % external liquid Apply topically daily as needed. 08/22/24   Stuart Vernell Norris, PA-C  Glucose Blood (BLOOD GLUCOSE TEST STRIPS) STRP 1 each by Does not apply route 3 (three) times daily. Use as directed to check blood sugar. May dispense any manufacturer covered by patient's insurance and fits patient's device. 08/15/24   Shahmehdi, Adriana LABOR, MD  HYDROcodone -acetaminophen  (NORCO/VICODIN) 5-325 MG tablet Take 1 tablet by mouth every 6 (six) hours as needed. 09/07/24   Onesimo Oneil LABOR, MD  insulin  glargine (LANTUS ) 100 UNIT/ML Solostar Pen Inject 15 Units into the skin daily. May substitute as needed per insurance. 08/15/24   Shahmehdi, Adriana LABOR, MD  Insulin  Pen Needle (PEN NEEDLES) 31G X 5 MM MISC 1 each by Does not apply route 3 (three)  times daily. May dispense any manufacturer covered by patient's insurance. 08/15/24   ShahmehdiAdriana LABOR, MD  Lancet Device MISC 1 each by Does not apply route 3 (three) times daily. May dispense any manufacturer covered by patient's insurance. 08/15/24   Shahmehdi, Adriana LABOR, MD  Lancets MISC 1 each by Does not apply route 3 (three) times daily. Use as directed to check blood sugar. May dispense any manufacturer covered by patient's insurance and fits patient's device. 08/15/24   Shahmehdi, Adriana LABOR, MD  lisinopril  (ZESTRIL ) 20 MG tablet Take 20 mg by mouth every morning.    [provider]  metFORMIN  (GLUCOPHAGE ) 500 MG tablet Take 1 tablet (500 mg total) by mouth daily with breakfast. 08/16/24 11/14/24  Willette Adriana LABOR, MD  mupirocin  ointment (BACTROBAN ) 2 % Apply 1 Application topically 2 (two) times daily. 08/22/24   Stuart Vernell Norris, PA-C  nicotine  (NICODERM CQ  - DOSED IN MG/24 HOURS) 21 mg/24hr patch Place 1 patch (21 mg total) onto the skin daily. 08/16/24   Shahmehdi, Seyed A, MD  omeprazole (PRILOSEC) 40 MG capsule Take 40 mg by mouth every morning.    [provider]  polyethylene glycol (MIRALAX  / GLYCOLAX ) 17 g packet Take 17 g by mouth 2 (two) times daily. Hold if develop diarrhea 08/15/24   Shahmehdi, Adriana LABOR, MD  pravastatin  (PRAVACHOL ) 40 MG tablet Take 40 mg by mouth every morning.    [provider]  tadalafil  (  CIALIS ) 20 MG tablet Take 1 tablet (20 mg total) by mouth daily as needed for erectile dysfunction. 06/06/24   Vaillancourt, Samantha, PA-C    Allergies: Penicillins    Review of Systems  Updated Vital Signs BP (!) 159/89 (BP Location: Left Arm)   Pulse 78   Temp 98.4 F (36.9 C) (Oral)   Resp 18   Ht 6' 3 (1.905 m)   Wt 127.9 kg   SpO2 93%   BMI 35.25 kg/m   Physical Exam Constitutional:      General: He is not in acute distress.    Appearance: Normal appearance. He is not ill-appearing.  HENT:     Mouth/Throat:     Mouth: Mucous  membranes are dry.     Pharynx: Oropharynx is clear.  Cardiovascular:     Rate and Rhythm: Normal rate and regular rhythm.     Pulses: Normal pulses.     Heart sounds: Normal heart sounds. No murmur heard.    No friction rub.  Pulmonary:     Effort: Pulmonary effort is normal.     Breath sounds: Normal breath sounds.  Abdominal:     General: There is no distension.     Palpations: There is no mass.     Tenderness: There is no abdominal tenderness. There is no guarding.  Skin:    Capillary Refill: Capillary refill takes 2 to 3 seconds.  Neurological:     Mental Status: He is alert.     (all labs ordered are listed, but only abnormal results are displayed) Labs Reviewed  BASIC METABOLIC PANEL WITH GFR - Abnormal; Notable for the following components:      Result Value   CO2 19 (*)    Glucose, Bld 104 (*)    Anion gap 17 (*)    All other components within normal limits  CBC WITH DIFFERENTIAL/PLATELET - Abnormal; Notable for the following components:   MCV 101.9 (*)    All other components within normal limits  CBG MONITORING, ED - Abnormal; Notable for the following components:   Glucose-Capillary 111 (*)    All other components within normal limits    EKG: EKG Interpretation Date/Time:  Friday September 14 2024 10:35:35 EDT Ventricular Rate:  100 PR Interval:  140 QRS Duration:  86 QT Interval:  350 QTC Calculation: 451 R Axis:   48  Text Interpretation: Normal sinus rhythm ST & T wave abnormality, consider inferior ischemia Abnormal ECG When compared with ECG of 19-May-2024 12:06, No significant change was found Confirmed by Yolande Charleston [phone]) on 09/14/2024 11:04:53 AM  Radiology: No results found.   Procedures   Medications Ordered in the ED  lactated ringers  bolus 1,000 mL (0 mLs Intravenous Stopped 09/14/24 1408)  lactated ringers  bolus 1,000 mL (0 mLs Intravenous Stopped 09/14/24 1408)                                    Medical Decision  Making Amount and/or Complexity of Data Reviewed Labs: ordered.   53 year old male who presents emergency department with lightheadedness with standing  Initial Ddx:  Dehydration, DKA, arrhythmia, electrolyte abnormality, MI  MDM/Course:  Patient presents to the emergency department with lightheadedness on standing.  No other significant symptoms at this point in time.  No actual syncope.  On exam he is not in acute distress.  Blood pressure is 159/89.  Mildly tachycardic at 107.  Was given  2 L of fluids and upon re-evaluation was able to better.  Suspect he was dehydrated.  Unclear exactly what this would be from.  Does have a mild anion gap metabolic acidosis as well with a bicarb of 19 but no signs of DKA.  Not having any infectious symptoms.  EKG did show some nonspecific ST changes but since he is not having any chest pain, shortness of breath, or diaphoresis feel that MI is highly unlikely.  No Brugada, long QT, or WPW or any other concerning signs that would indicate arrhythmia.  Counseled him to stay well-hydrated and follow-up with his primary doctor in a couple days.  This patient presents to the ED for concern of complaints listed in HPI, this involves an extensive number of treatment options, and is a complaint that carries with it a high risk of complications and morbidity. Disposition including potential need for admission considered.   Dispo: DC Home. Return precautions discussed including, but not limited to, those listed in the AVS. Allowed pt time to ask questions which were answered fully prior to dc.  Records reviewed Outpatient Clinic Notes The following labs were independently interpreted: CBC and show no acute abnormality I independently reviewed the following imaging with scope of interpretation limited to determining acute life threatening conditions related to emergency care: Chest x-ray and agree with the radiologist interpretation with the following exceptions: none I  personally reviewed and interpreted cardiac monitoring: normal sinus rhythm  I personally reviewed and interpreted the pt's EKG: see above for interpretation  I have reviewed the patients home medications and made adjustments as needed  Portions of this note were generated with Dragon dictation software. Dictation errors may occur despite best attempts at proofreading.     Final diagnoses:  Dizziness  Dehydration    ED Discharge Orders     None          Yolande Lamar BROCKS, MD 09/14/24 2105

## 2024-09-14 NOTE — ED Notes (Signed)
Cbg 111

## 2024-09-17 ENCOUNTER — Ambulatory Visit: Payer: MEDICAID | Admitting: Internal Medicine

## 2024-09-21 ENCOUNTER — Encounter: Payer: Self-pay | Admitting: Orthopedic Surgery

## 2024-09-21 ENCOUNTER — Ambulatory Visit: Payer: MEDICAID | Admitting: Orthopedic Surgery

## 2024-09-21 ENCOUNTER — Other Ambulatory Visit (INDEPENDENT_AMBULATORY_CARE_PROVIDER_SITE_OTHER): Payer: MEDICAID

## 2024-09-21 DIAGNOSIS — S62620D Displaced fracture of medial phalanx of right index finger, subsequent encounter for fracture with routine healing: Secondary | ICD-10-CM

## 2024-09-21 MED ORDER — HYDROCODONE-ACETAMINOPHEN 5-325 MG PO TABS: 1.0000 | ORAL_TABLET | Freq: Four times a day (QID) | ORAL | 0 refills | Status: DC | PRN

## 2024-09-21 NOTE — Patient Instructions (Addendum)
 Okay to remove the splint, and work on gentle range of motion  Elevate the hand to help with swelling.  One more prescription of pain medication has been provided.

## 2024-09-21 NOTE — Progress Notes (Signed)
 Return patient Visit  Assessment: Ethan [name]. is a 53 y.o. male with the following: 1. Closed displaced fracture of middle phalanx of right index finger, subsequent encounter  Plan: [name] sustained injury to the right index finger.  XR remain stable.  Residual swelling, which will continue to improve.  Some stiffness.  Continue with splint, ok to remove to work on gentle ROM.  An additional prescription provided today, but we will work to get off the medication.  Follow up in 2 weeks.   Follow-up: Return in about 2 weeks (around 10/05/2024).  Subjective:  Chief Complaint  Patient presents with   Fracture    R index finger DOI 08/21/24  Pt states the throbbing, swelling, and redness has gone down a little but is still painful    History of Present Illness: Ethan [name]. is a 53 y.o. male who returns for evaluation of right index finger pain.  He was working in a garage, when a chain caught his finger and twisted the finger.  Injury was sustained about a month ago.  He still has stiffness and pain.  He is still using a splint.  No fevers or chills.   Review of Systems: No fevers or chills No numbness or tingling No chest pain No shortness of breath No bowel or bladder dysfunction No GI distress No headaches    Objective: There were no vitals taken for this visit.  Physical Exam:  General: Alert and oriented. and No acute distress. Gait: Normal gait.  Right index finger with residual swelling, improving.  Volar soft tissue is healed.  Improved motion at PIP and DIP, but still stiff.   IMAGING: I personally ordered and reviewed the following images  X-rays of the right index finger were obtained in clinic today.  These are compared to available x-rays.  Stable appearing comminuted fracture of the middle phalanx.  No intra-articular involvement.  No interval displacement.  Unchanged alignment overall.  No bony lesions.   Impression: Stable  right index finger, comminuted middle phalanx fracture  New Medications:  Meds ordered this encounter  Medications   HYDROcodone -acetaminophen  (NORCO/VICODIN) 5-325 MG tablet    Sig: Take 1 tablet by mouth every 6 (six) hours as needed.    Dispense:  20 tablet    Refill:  0      [name], MD  09/21/2024 12:00 PM

## 2024-09-24 ENCOUNTER — Encounter: Payer: MEDICAID | Admitting: Nutrition

## 2024-10-05 ENCOUNTER — Encounter: Payer: Self-pay | Admitting: Orthopedic Surgery

## 2024-10-05 ENCOUNTER — Ambulatory Visit: Payer: MEDICAID | Admitting: Orthopedic Surgery

## 2024-10-05 ENCOUNTER — Other Ambulatory Visit (INDEPENDENT_AMBULATORY_CARE_PROVIDER_SITE_OTHER): Payer: MEDICAID

## 2024-10-05 DIAGNOSIS — S62620D Displaced fracture of medial phalanx of right index finger, subsequent encounter for fracture with routine healing: Secondary | ICD-10-CM

## 2024-10-05 MED ORDER — HYDROCODONE-ACETAMINOPHEN 5-325 MG PO TABS: 1.0000 | ORAL_TABLET | Freq: Four times a day (QID) | ORAL | 0 refills | Status: DC | PRN

## 2024-10-05 NOTE — Progress Notes (Signed)
 Return patient Visit  Assessment: Ethan [name]. is a 53 y.o. male with the following: 1. Closed displaced fracture of middle phalanx of right index finger, subsequent encounter  Plan: Ethan [name]. sustained injury to the right index finger.  X-rays have not changed.  He does have some residual swelling, and tenderness.  His motion is pretty good, considering the extent of the injury.  Urged him to continue working on motion.  Swelling at this point is to be expected.  Medication such as ibuprofen or naproxen  as needed.  1 final prescription of narcotics have been provided.  I would like see him back in 1 month for repeat evaluation.   Follow-up: Return in about 4 weeks (around 11/02/2024).  Subjective:  Chief Complaint  Patient presents with   Fracture    R index finger DOI 08/21/24    History of Present Illness: Ethan [name]. is a 53 y.o. male who returns for evaluation of right index finger pain.  He was working in a garage, when a chain caught his finger and twisted the finger.  Injury was sustained about 6 weeks ago.  He states it is still painful.  He notes swelling.  He has been working on motion.  He continues to use the splint while at work.   Review of Systems: No fevers or chills No numbness or tingling No chest pain No shortness of breath No bowel or bladder dysfunction No GI distress No headaches    Objective: There were no vitals taken for this visit.  Physical Exam:  General: Alert and oriented. and No acute distress. Gait: Normal gait.  Index finger without bruising.  There is residual swelling.  Soft tissue laceration has healed.  Stiffness is noted the PIP and the DIP joint, but this is improving.  He does have some tenderness within the middle phalanx of the index finger  IMAGING: I personally ordered and reviewed the following images  X-rays of the right index finger were obtained in clinic today.  These are compared to prior  x-rays.  Comminuted fracture of the middle phalanx without interval displacement.  There has been some interval consolidation.  Callus is forming.  No new injuries.  No bony lesions.  Impression: Stable right index finger, middle phalanx comminuted fracture  New Medications:  Meds ordered this encounter  Medications   HYDROcodone -acetaminophen  (NORCO/VICODIN) 5-325 MG tablet    Sig: Take 1 tablet by mouth every 6 (six) hours as needed.    Dispense:  20 tablet    Refill:  0      Ethan DELENA Horde, MD  10/05/2024 12:15 PM

## 2024-10-08 ENCOUNTER — Other Ambulatory Visit: Payer: Self-pay

## 2024-10-08 ENCOUNTER — Emergency Department (HOSPITAL_COMMUNITY): Payer: MEDICAID

## 2024-10-08 ENCOUNTER — Inpatient Hospital Stay (HOSPITAL_COMMUNITY)
Admission: EM | Admit: 2024-10-08 | Discharge: 2024-10-13 | DRG: 141 | Disposition: A | Discharge status: Home Health | Payer: MEDICAID | Attending: Family Medicine | Admitting: Family Medicine

## 2024-10-08 ENCOUNTER — Encounter (HOSPITAL_COMMUNITY): Payer: Self-pay | Admitting: Emergency Medicine

## 2024-10-08 DIAGNOSIS — Z6836 Body mass index (BMI) 36.0-36.9, adult: Secondary | ICD-10-CM | POA: Diagnosis not present

## 2024-10-08 DIAGNOSIS — Z794 Long term (current) use of insulin: Secondary | ICD-10-CM

## 2024-10-08 DIAGNOSIS — E119 Type 2 diabetes mellitus without complications: Secondary | ICD-10-CM | POA: Diagnosis not present

## 2024-10-08 DIAGNOSIS — S02650D Fracture of angle of mandible, unspecified side, subsequent encounter for fracture with routine healing: Secondary | ICD-10-CM

## 2024-10-08 DIAGNOSIS — I10 Essential (primary) hypertension: Secondary | ICD-10-CM | POA: Diagnosis present

## 2024-10-08 DIAGNOSIS — K219 Gastro-esophageal reflux disease without esophagitis: Secondary | ICD-10-CM | POA: Diagnosis present

## 2024-10-08 DIAGNOSIS — F17210 Nicotine dependence, cigarettes, uncomplicated: Secondary | ICD-10-CM | POA: Diagnosis present

## 2024-10-08 DIAGNOSIS — K1370 Unspecified lesions of oral mucosa: Secondary | ICD-10-CM | POA: Diagnosis present

## 2024-10-08 DIAGNOSIS — J45909 Unspecified asthma, uncomplicated: Secondary | ICD-10-CM | POA: Diagnosis present

## 2024-10-08 DIAGNOSIS — E1165 Type 2 diabetes mellitus with hyperglycemia: Secondary | ICD-10-CM | POA: Diagnosis present

## 2024-10-08 DIAGNOSIS — S02609B Fracture of mandible, unspecified, initial encounter for open fracture: Secondary | ICD-10-CM | POA: Diagnosis present

## 2024-10-08 DIAGNOSIS — S01511A Laceration without foreign body of lip, initial encounter: Secondary | ICD-10-CM | POA: Diagnosis not present

## 2024-10-08 DIAGNOSIS — S02609A Fracture of mandible, unspecified, initial encounter for closed fracture: Secondary | ICD-10-CM | POA: Diagnosis not present

## 2024-10-08 DIAGNOSIS — Z7984 Long term (current) use of oral hypoglycemic drugs: Secondary | ICD-10-CM

## 2024-10-08 DIAGNOSIS — M109 Gout, unspecified: Secondary | ICD-10-CM | POA: Diagnosis present

## 2024-10-08 DIAGNOSIS — S02609K Fracture of mandible, unspecified, subsequent encounter for fracture with nonunion: Secondary | ICD-10-CM

## 2024-10-08 DIAGNOSIS — D72829 Elevated white blood cell count, unspecified: Secondary | ICD-10-CM | POA: Diagnosis present

## 2024-10-08 DIAGNOSIS — S02670A Fracture of alveolus of mandible, unspecified side, initial encounter for closed fracture: Secondary | ICD-10-CM

## 2024-10-08 DIAGNOSIS — S02672B Fracture of alveolus of left mandible, initial encounter for open fracture: Secondary | ICD-10-CM | POA: Diagnosis present

## 2024-10-08 DIAGNOSIS — S02671B Fracture of alveolus of right mandible, initial encounter for open fracture: Principal | ICD-10-CM | POA: Diagnosis present

## 2024-10-08 DIAGNOSIS — S02601B Fracture of unspecified part of body of right mandible, initial encounter for open fracture: Secondary | ICD-10-CM | POA: Diagnosis not present

## 2024-10-08 DIAGNOSIS — E872 Acidosis, unspecified: Secondary | ICD-10-CM | POA: Diagnosis present

## 2024-10-08 DIAGNOSIS — Z79899 Other long term (current) drug therapy: Secondary | ICD-10-CM

## 2024-10-08 DIAGNOSIS — Z880 Allergy status to penicillin: Secondary | ICD-10-CM | POA: Diagnosis not present

## 2024-10-08 DIAGNOSIS — E66812 Obesity, class 2: Secondary | ICD-10-CM | POA: Diagnosis present

## 2024-10-08 DIAGNOSIS — E7800 Pure hypercholesterolemia, unspecified: Secondary | ICD-10-CM | POA: Diagnosis present

## 2024-10-08 HISTORY — DX: Type 2 diabetes mellitus without complications: E11.9

## 2024-10-08 LAB — CBC
HCT: 47.1 % (ref 39.0–52.0)
Hemoglobin: 15.8 g/dL (ref 13.0–17.0)
MCH: 33.5 pg (ref 26.0–34.0)
MCHC: 33.5 g/dL (ref 30.0–36.0)
MCV: 99.8 fL (ref 80.0–100.0)
Platelets: 239 K/uL (ref 150–400)
RBC: 4.72 MIL/uL (ref 4.22–5.81)
RDW: 13.8 % (ref 11.5–15.5)
WBC: 11.6 K/uL — ABNORMAL HIGH (ref 4.0–10.5)
nRBC: 0 % (ref 0.0–0.2)

## 2024-10-08 LAB — CBG MONITORING, ED: Glucose-Capillary: 109 mg/dL — ABNORMAL HIGH (ref 70–99)

## 2024-10-08 LAB — BASIC METABOLIC PANEL WITH GFR
Anion gap: 26 — ABNORMAL HIGH (ref 5–15)
BUN: 19 mg/dL (ref 6–20)
CO2: 14 mmol/L — ABNORMAL LOW (ref 22–32)
Calcium: 10 mg/dL (ref 8.9–10.3)
Chloride: 98 mmol/L (ref 98–111)
Creatinine, Ser: 0.97 mg/dL (ref 0.61–1.24)
GFR, Estimated: >60 mL/min
Glucose, Bld: 89 mg/dL (ref 70–99)
Potassium: 4.3 mmol/L (ref 3.5–5.1)
Sodium: 138 mmol/L (ref 135–145)

## 2024-10-08 MED ORDER — HYDROMORPHONE HCL 1 MG/ML IJ SOLN
1.0000 mg | INTRAMUSCULAR | Status: AC
  Administered 2024-10-08: 1 mg via INTRAVENOUS
  Filled 2024-10-08: qty 1

## 2024-10-08 MED ORDER — CLINDAMYCIN PHOSPHATE 600 MG/50ML IV SOLN
600.0000 mg | Freq: Once | INTRAVENOUS | Status: AC
  Administered 2024-10-08: 600 mg via INTRAVENOUS
  Filled 2024-10-08: qty 50

## 2024-10-08 MED ORDER — SODIUM CHLORIDE 0.9 % IV BOLUS
1000.0000 mL | Freq: Once | INTRAVENOUS | Status: AC
  Administered 2024-10-08: 1000 mL via INTRAVENOUS

## 2024-10-08 MED ORDER — HYDROMORPHONE HCL 1 MG/ML IJ SOLN: 0.5000 mg | INTRAMUSCULAR | Status: DC

## 2024-10-08 MED ORDER — HYDROMORPHONE HCL 1 MG/ML IJ SOLN
0.5000 mg | Freq: Once | INTRAMUSCULAR | Status: AC
  Administered 2024-10-08: 0.5 mg via INTRAVENOUS
  Filled 2024-10-08: qty 0.5

## 2024-10-08 MED ORDER — PROCHLORPERAZINE EDISYLATE 10 MG/2ML IJ SOLN
5.0000 mg | Freq: Four times a day (QID) | INTRAMUSCULAR | Status: DC | PRN
  Administered 2024-10-09 (×2): 5 mg via INTRAVENOUS
  Filled 2024-10-08 (×2): qty 2

## 2024-10-08 MED ORDER — BISACODYL 10 MG RE SUPP: 10.0000 mg | Freq: Every day | RECTAL | Status: DC | PRN

## 2024-10-08 MED ORDER — LACTATED RINGERS IV SOLN: INTRAVENOUS | Status: DC

## 2024-10-08 MED ORDER — HYDROMORPHONE HCL 1 MG/ML IJ SOLN
1.0000 mg | INTRAMUSCULAR | Status: DC | PRN
  Administered 2024-10-09 (×2): 1 mg via INTRAVENOUS
  Filled 2024-10-08 (×2): qty 1

## 2024-10-08 MED ORDER — HYDROMORPHONE HCL 1 MG/ML IJ SOLN
0.5000 mg | INTRAMUSCULAR | Status: DC | PRN
  Administered 2024-10-09: 0.5 mg via INTRAVENOUS
  Filled 2024-10-08: qty 0.5

## 2024-10-08 MED ORDER — POLYETHYLENE GLYCOL 3350 17 G PO PACK: 17.0000 g | PACK | Freq: Every day | ORAL | Status: DC | PRN
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
## 2024-10-09 ENCOUNTER — Encounter (HOSPITAL_COMMUNITY)
Admission: EM | Disposition: A | Discharge status: Home Health | Payer: Self-pay | Source: Home / Self Care | Attending: Family Medicine

## 2024-10-09 ENCOUNTER — Encounter (HOSPITAL_COMMUNITY): Payer: Self-pay | Admitting: Internal Medicine

## 2024-10-09 ENCOUNTER — Inpatient Hospital Stay (HOSPITAL_COMMUNITY): Payer: MEDICAID | Admitting: Anesthesiology

## 2024-10-09 DIAGNOSIS — S02609A Fracture of mandible, unspecified, initial encounter for closed fracture: Secondary | ICD-10-CM

## 2024-10-09 DIAGNOSIS — F17210 Nicotine dependence, cigarettes, uncomplicated: Secondary | ICD-10-CM | POA: Diagnosis not present

## 2024-10-09 DIAGNOSIS — I10 Essential (primary) hypertension: Secondary | ICD-10-CM

## 2024-10-09 DIAGNOSIS — E119 Type 2 diabetes mellitus without complications: Secondary | ICD-10-CM

## 2024-10-09 DIAGNOSIS — S02609K Fracture of mandible, unspecified, subsequent encounter for fracture with nonunion: Secondary | ICD-10-CM | POA: Diagnosis not present

## 2024-10-09 DIAGNOSIS — K1370 Unspecified lesions of oral mucosa: Secondary | ICD-10-CM

## 2024-10-09 HISTORY — PX: ORIF MANDIBULAR FRACTURE: SHX2127

## 2024-10-09 LAB — GLUCOSE, CAPILLARY
Glucose-Capillary: 138 mg/dL — ABNORMAL HIGH (ref 70–99)
Glucose-Capillary: 153 mg/dL — ABNORMAL HIGH (ref 70–99)
Glucose-Capillary: 166 mg/dL — ABNORMAL HIGH (ref 70–99)
Glucose-Capillary: 171 mg/dL — ABNORMAL HIGH (ref 70–99)

## 2024-10-09 LAB — BASIC METABOLIC PANEL WITH GFR
Anion gap: 18 — ABNORMAL HIGH (ref 5–15)
BUN: 16 mg/dL (ref 6–20)
CO2: 21 mmol/L — ABNORMAL LOW (ref 22–32)
Calcium: 9.6 mg/dL (ref 8.9–10.3)
Chloride: 99 mmol/L (ref 98–111)
Creatinine, Ser: 0.98 mg/dL (ref 0.61–1.24)
GFR, Estimated: >60 mL/min (ref >60)
Glucose, Bld: 104 mg/dL — ABNORMAL HIGH (ref 70–99)
Potassium: 4.6 mmol/L (ref 3.5–5.1)
Sodium: 137 mmol/L (ref 135–145)

## 2024-10-09 LAB — BLOOD GAS, VENOUS
Acid-base deficit: 1.4 mmol/L (ref 0.0–2.0)
Bicarbonate: 21.5 mmol/L (ref 20.0–28.0)
Drawn by: 53361
O2 Saturation: 96.3 %
Patient temperature: 36.8
pCO2, Ven: 31 mmHg — ABNORMAL LOW (ref 44–60)
pH, Ven: 7.45 — ABNORMAL HIGH (ref 7.25–7.43)
pO2, Ven: 74 mmHg — ABNORMAL HIGH (ref 32–45)

## 2024-10-09 LAB — MAGNESIUM: Magnesium: 2.4 mg/dL (ref 1.7–2.4)

## 2024-10-09 LAB — CBG MONITORING, ED
Glucose-Capillary: 116 mg/dL — ABNORMAL HIGH (ref 70–99)
Glucose-Capillary: 123 mg/dL — ABNORMAL HIGH (ref 70–99)
Glucose-Capillary: 126 mg/dL — ABNORMAL HIGH (ref 70–99)
Glucose-Capillary: 108 mg/dL — ABNORMAL HIGH (ref 70–99)

## 2024-10-09 LAB — CBC
HCT: 43.6 % (ref 39.0–52.0)
Hemoglobin: 14.6 g/dL (ref 13.0–17.0)
MCH: 33.2 pg (ref 26.0–34.0)
MCHC: 33.5 g/dL (ref 30.0–36.0)
MCV: 99.1 fL (ref 80.0–100.0)
Platelets: 221 K/uL (ref 150–400)
RBC: 4.4 MIL/uL (ref 4.22–5.81)
RDW: 13.6 % (ref 11.5–15.5)
WBC: 9.8 K/uL (ref 4.0–10.5)
nRBC: 0 % (ref 0.0–0.2)

## 2024-10-09 LAB — PHOSPHORUS: Phosphorus: 3.3 mg/dL (ref 2.5–4.6)

## 2024-10-09 SURGERY — OPEN REDUCTION INTERNAL FIXATION (ORIF) MANDIBULAR FRACTURE
Anesthesia: General | Site: Mouth | Laterality: Bilateral

## 2024-10-09 MED ORDER — BACITRACIN ZINC 500 UNIT/GM EX OINT
TOPICAL_OINTMENT | CUTANEOUS | Status: AC
  Filled 2024-10-09: qty 28.35

## 2024-10-09 MED ORDER — SUGAMMADEX SODIUM 200 MG/2ML IV SOLN
INTRAVENOUS | Status: DC | PRN
  Administered 2024-10-09: 200 mg via INTRAVENOUS

## 2024-10-09 MED ORDER — ROCURONIUM BROMIDE 10 MG/ML (PF) SYRINGE
PREFILLED_SYRINGE | INTRAVENOUS | Status: AC
  Filled 2024-10-09: qty 10

## 2024-10-09 MED ORDER — LIDOCAINE 2% (20 MG/ML) 5 ML SYRINGE
INTRAMUSCULAR | Status: AC
  Filled 2024-10-09: qty 5

## 2024-10-09 MED ORDER — FENTANYL CITRATE (PF) 250 MCG/5ML IJ SOLN
INTRAMUSCULAR | Status: AC
  Filled 2024-10-09: qty 5

## 2024-10-09 MED ORDER — PROPOFOL 10 MG/ML IV BOLUS
INTRAVENOUS | Status: AC
Start: 2024-10-09 — End: 2024-10-09
  Filled 2024-10-09: qty 20

## 2024-10-09 MED ORDER — ONDANSETRON HCL 4 MG/2ML IJ SOLN
INTRAMUSCULAR | Status: AC
  Filled 2024-10-09: qty 10

## 2024-10-09 MED ORDER — ALBUMIN HUMAN 5 % IV SOLN: INTRAVENOUS | Status: DC | PRN

## 2024-10-09 MED ORDER — ONDANSETRON HCL 4 MG/2ML IJ SOLN
INTRAMUSCULAR | Status: DC | PRN
  Administered 2024-10-09: 4 mg via INTRAVENOUS

## 2024-10-09 MED ORDER — LIDOCAINE 2% (20 MG/ML) 5 ML SYRINGE
INTRAMUSCULAR | Status: DC | PRN
  Administered 2024-10-09: 100 mg via INTRAVENOUS

## 2024-10-09 MED ORDER — HYDROMORPHONE HCL 1 MG/ML IJ SOLN
1.0000 mg | INTRAMUSCULAR | Status: AC
  Administered 2024-10-09: 1 mg via INTRAVENOUS
  Filled 2024-10-09: qty 1

## 2024-10-09 MED ORDER — MIDAZOLAM HCL (PF) 2 MG/2ML IJ SOLN
INTRAMUSCULAR | Status: DC | PRN
  Administered 2024-10-09: 2 mg via INTRAVENOUS

## 2024-10-09 MED ORDER — FENTANYL CITRATE (PF) 100 MCG/2ML IJ SOLN
INTRAMUSCULAR | Status: AC
  Filled 2024-10-09: qty 2

## 2024-10-09 MED ORDER — ONDANSETRON HCL 4 MG/2ML IJ SOLN
4.0000 mg | Freq: Four times a day (QID) | INTRAMUSCULAR | Status: AC
  Administered 2024-10-09 – 2024-10-10 (×4): 4 mg via INTRAVENOUS
  Filled 2024-10-09 (×4): qty 2

## 2024-10-09 MED ORDER — PROPOFOL 500 MG/50ML IV EMUL
INTRAVENOUS | Status: DC | PRN
  Administered 2024-10-09: 50 ug/kg/min via INTRAVENOUS

## 2024-10-09 MED ORDER — DEXAMETHASONE SOD PHOSPHATE PF 10 MG/ML IJ SOLN
INTRAMUSCULAR | Status: DC | PRN
  Administered 2024-10-09: 10 mg via INTRAVENOUS

## 2024-10-09 MED ORDER — MIDAZOLAM HCL 2 MG/2ML IJ SOLN
INTRAMUSCULAR | Status: AC
  Filled 2024-10-09: qty 2

## 2024-10-09 MED ORDER — HYDRALAZINE HCL 20 MG/ML IJ SOLN
5.0000 mg | Freq: Four times a day (QID) | INTRAMUSCULAR | Status: DC | PRN
  Administered 2024-10-09 – 2024-10-12 (×4): 5 mg via INTRAVENOUS
  Filled 2024-10-09 (×4): qty 1

## 2024-10-09 MED ORDER — LIDOCAINE-EPINEPHRINE 1 %-1:100000 IJ SOLN
INTRAMUSCULAR | Status: AC
  Filled 2024-10-09: qty 1

## 2024-10-09 MED ORDER — DEXTROSE IN LACTATED RINGERS 5 % IV SOLN: INTRAVENOUS | Status: AC

## 2024-10-09 MED ORDER — CLINDAMYCIN PHOSPHATE 600 MG/50ML IV SOLN
INTRAVENOUS | Status: AC
  Filled 2024-10-09: qty 50

## 2024-10-09 MED ORDER — CLINDAMYCIN PHOSPHATE 600 MG/50ML IV SOLN
600.0000 mg | Freq: Three times a day (TID) | INTRAVENOUS | Status: DC
  Administered 2024-10-09 – 2024-10-12 (×8): 600 mg via INTRAVENOUS
  Filled 2024-10-09 (×12): qty 50

## 2024-10-09 MED ORDER — ORAL CARE MOUTH RINSE: 15.0000 mL | Freq: Once | OROMUCOSAL | Status: DC

## 2024-10-09 MED ORDER — SUCCINYLCHOLINE CHLORIDE 200 MG/10ML IV SOSY
PREFILLED_SYRINGE | INTRAVENOUS | Status: DC | PRN
  Administered 2024-10-09: 140 mg via INTRAVENOUS

## 2024-10-09 MED ORDER — FENTANYL CITRATE (PF) 250 MCG/5ML IJ SOLN
INTRAMUSCULAR | Status: DC | PRN
Start: 2024-10-09 — End: 2024-10-09
  Administered 2024-10-09: 50 ug via INTRAVENOUS
  Administered 2024-10-09: 100 ug via INTRAVENOUS
  Administered 2024-10-09: 50 ug via INTRAVENOUS
  Administered 2024-10-09: 100 ug via INTRAVENOUS
  Administered 2024-10-09 (×2): 50 ug via INTRAVENOUS
  Administered 2024-10-09: 100 ug via INTRAVENOUS

## 2024-10-09 MED ORDER — OXYMETAZOLINE HCL 0.05 % NA SOLN
NASAL | Status: AC
  Filled 2024-10-09: qty 30

## 2024-10-09 MED ORDER — MEPERIDINE HCL 25 MG/ML IJ SOLN: 6.2500 mg | INTRAMUSCULAR | Status: DC | PRN

## 2024-10-09 MED ORDER — LABETALOL HCL 5 MG/ML IV SOLN
INTRAVENOUS | Status: AC
  Filled 2024-10-09: qty 4

## 2024-10-09 MED ORDER — 0.9 % SODIUM CHLORIDE (POUR BTL) OPTIME
TOPICAL | Status: DC | PRN
  Administered 2024-10-09: 1000 mL

## 2024-10-09 MED ORDER — LABETALOL HCL 5 MG/ML IV SOLN
INTRAVENOUS | Status: DC | PRN
  Administered 2024-10-09: 10 mg via INTRAVENOUS
  Administered 2024-10-09 (×2): 5 mg via INTRAVENOUS

## 2024-10-09 MED ORDER — CHLORHEXIDINE GLUCONATE 0.12 % MT SOLN: 15.0000 mL | Freq: Once | OROMUCOSAL | Status: DC

## 2024-10-09 MED ORDER — LACTATED RINGERS IV SOLN: INTRAVENOUS | Status: DC

## 2024-10-09 MED ORDER — PHENYLEPHRINE 80 MCG/ML (10ML) SYRINGE FOR IV PUSH (FOR BLOOD PRESSURE SUPPORT)
PREFILLED_SYRINGE | INTRAVENOUS | Status: AC
Start: 2024-10-09 — End: 2024-10-09
  Filled 2024-10-09: qty 10

## 2024-10-09 MED ORDER — TISSEEL 4 ML EX KIT
4.0000 mL | PACK | CUTANEOUS | Status: DC
  Filled 2024-10-09: qty 1

## 2024-10-09 MED ORDER — VISTASEAL 2 ML EX PSKT: 2.0000 mL | PREFILLED_SYRINGE | CUTANEOUS | Status: DC

## 2024-10-09 MED ORDER — ONDANSETRON HCL 4 MG/2ML IJ SOLN: 4.0000 mg | Freq: Once | INTRAMUSCULAR | Status: DC | PRN

## 2024-10-09 MED ORDER — OXYCODONE HCL 5 MG PO TABS: 5.0000 mg | ORAL_TABLET | Freq: Once | ORAL | Status: DC | PRN

## 2024-10-09 MED ORDER — LIDOCAINE-EPINEPHRINE 1 %-1:100000 IJ SOLN
INTRAMUSCULAR | Status: DC | PRN
  Administered 2024-10-09: 2 mL
  Administered 2024-10-09: 5 mL

## 2024-10-09 MED ORDER — PROPOFOL 10 MG/ML IV BOLUS
INTRAVENOUS | Status: DC | PRN
  Administered 2024-10-09: 30 mg via INTRAVENOUS
  Administered 2024-10-09: 50 mg via INTRAVENOUS
  Administered 2024-10-09: 170 mg via INTRAVENOUS

## 2024-10-09 MED ORDER — DEXMEDETOMIDINE HCL IN NACL 80 MCG/20ML IV SOLN
INTRAVENOUS | Status: DC | PRN
  Administered 2024-10-09: 8 ug via INTRAVENOUS
  Administered 2024-10-09: 4 ug via INTRAVENOUS
  Administered 2024-10-09: 12 ug via INTRAVENOUS
  Administered 2024-10-09 (×2): 4 ug via INTRAVENOUS

## 2024-10-09 MED ORDER — HYDRALAZINE HCL 20 MG/ML IJ SOLN
2.0000 mg | INTRAMUSCULAR | Status: AC
  Administered 2024-10-09: 2 mg via INTRAVENOUS
  Filled 2024-10-09: qty 1

## 2024-10-09 MED ORDER — FENTANYL CITRATE (PF) 100 MCG/2ML IJ SOLN
25.0000 ug | INTRAMUSCULAR | Status: DC | PRN
  Administered 2024-10-09 (×2): 50 ug via INTRAVENOUS
  Administered 2024-10-09 (×2): 25 ug via INTRAVENOUS

## 2024-10-09 MED ORDER — OXYMETAZOLINE HCL 0.05 % NA SOLN
1.0000 | Freq: Once | NASAL | Status: DC
Start: 2024-10-09 — End: 2024-10-13
  Filled 2024-10-09: qty 30

## 2024-10-09 MED ORDER — CLINDAMYCIN PHOSPHATE 600 MG/50ML IV SOLN
600.0000 mg | Freq: Two times a day (BID) | INTRAVENOUS | Status: DC
  Administered 2024-10-09: 600 mg via INTRAVENOUS
  Filled 2024-10-09 (×2): qty 50

## 2024-10-09 MED ORDER — OXYCODONE HCL 5 MG/5ML PO SOLN: 5.0000 mg | Freq: Once | ORAL | Status: DC | PRN

## 2024-10-09 MED ORDER — ROCURONIUM BROMIDE 10 MG/ML (PF) SYRINGE
PREFILLED_SYRINGE | INTRAVENOUS | Status: DC | PRN
  Administered 2024-10-09: 30 mg via INTRAVENOUS
  Administered 2024-10-09 (×2): 20 mg via INTRAVENOUS
  Administered 2024-10-09 (×2): 40 mg via INTRAVENOUS

## 2024-10-09 MED ORDER — HYDROMORPHONE HCL 1 MG/ML IJ SOLN
1.0000 mg | INTRAMUSCULAR | Status: DC | PRN
  Administered 2024-10-09 – 2024-10-11 (×11): 1 mg via INTRAVENOUS
  Filled 2024-10-09 (×11): qty 1

## 2024-10-09 MED ORDER — ONDANSETRON HCL 4 MG/2ML IJ SOLN
INTRAMUSCULAR | Status: AC
  Filled 2024-10-09: qty 2

## 2024-10-09 MED ORDER — INSULIN ASPART 100 UNIT/ML IJ SOLN
0.0000 [IU] | INTRAMUSCULAR | Status: DC
  Administered 2024-10-09 (×2): 1 [IU] via SUBCUTANEOUS
  Administered 2024-10-09 – 2024-10-10 (×5): 2 [IU] via SUBCUTANEOUS
  Administered 2024-10-11: 1 [IU] via SUBCUTANEOUS
  Administered 2024-10-11 (×2): 2 [IU] via SUBCUTANEOUS
  Administered 2024-10-12 (×4): 1 [IU] via SUBCUTANEOUS
  Administered 2024-10-13 (×2): 2 [IU] via SUBCUTANEOUS
  Filled 2024-10-09 (×4): qty 2
  Filled 2024-10-09 (×3): qty 1
  Filled 2024-10-09: qty 2
  Filled 2024-10-09 (×2): qty 1
  Filled 2024-10-09: qty 2
  Filled 2024-10-09 (×2): qty 1
  Filled 2024-10-09: qty 2

## 2024-10-09 SURGICAL SUPPLY — 48 items
BAG COUNTER SPONGE SURGICOUNT (BAG) ×1 IMPLANT
BIT DRILL RAINBOW 1.6X35 (BIT) IMPLANT
BLADE CLIPPER SURG (BLADE) IMPLANT
BLADE SURG 15 STRL LF DISP TIS (BLADE) IMPLANT
CANISTER SUCTION 3000ML PPV (SUCTIONS) ×1 IMPLANT
CLEANER TIP ELECTROSURG 2X2 (MISCELLANEOUS) ×1 IMPLANT
COVER SURGICAL LIGHT HANDLE (MISCELLANEOUS) ×1 IMPLANT
DERMABOND ADVANCED .7 DNX12 (GAUZE/BANDAGES/DRESSINGS) IMPLANT
DRAPE HALF SHEET 40X57 (DRAPES) IMPLANT
ELECT COATED BLADE 2.86 ST (ELECTRODE) ×1 IMPLANT
ELECTRODE COLORADO STRAIGHT 4 (ELECTROSURGICAL) IMPLANT
ELECTRODE REM PT RTRN 9FT ADLT (ELECTROSURGICAL) ×1 IMPLANT
GLOVE BIO SURGEON STRL SZ7.5 (GLOVE) ×1 IMPLANT
GOWN STRL REUS W/ TWL LRG LVL3 (GOWN DISPOSABLE) ×2 IMPLANT
KIT BASIN OR (CUSTOM PROCEDURE TRAY) ×1 IMPLANT
KIT TURNOVER KIT B (KITS) ×1 IMPLANT
NDL HYPO 25GX1X1/2 BEV (NEEDLE) IMPLANT
NDL PRECISIONGLIDE 27X1.5 (NEEDLE) ×1 IMPLANT
NEEDLE HYPO 25GX1X1/2 BEV (NEEDLE) IMPLANT
NEEDLE PRECISIONGLIDE 27X1.5 (NEEDLE) ×1 IMPLANT
PAD ARMBOARD POSITIONER FOAM (MISCELLANEOUS) ×2 IMPLANT
PENCIL SMOKE EVACUATOR (MISCELLANEOUS) ×1 IMPLANT
PLATE 4H FRACTURE (Plate) IMPLANT
PLATE HYBRID MMF SM (Plate) IMPLANT
PLATE MNDBLE 3D 4X2 SQUARE (Plate) IMPLANT
POSITIONER HEAD DONUT 9IN (MISCELLANEOUS) IMPLANT
SCISSORS WIRE ANG 4 3/4 DISP (INSTRUMENTS) ×1 IMPLANT
SCREW BONE CROSS PIN 2.0X10MM (Screw) IMPLANT
SCREW LOCK SELFDRIL 2.0X8M MMF (Screw) IMPLANT
SCREW LOCKING SELF DRILL 2.0X6 (Screw) IMPLANT
SCREW MNDBLE 2.0X8 BONE (Screw) IMPLANT
SCREW MNDBLE 2.3X8MM BONE (Screw) IMPLANT
SOLN 0.9% NACL POUR BTL 1000ML (IV SOLUTION) ×1 IMPLANT
SOLN STERILE WATER BTL 1000 ML (IV SOLUTION) ×1 IMPLANT
SPIKE FLUID TRANSFER (MISCELLANEOUS) ×1 IMPLANT
SUT CHROMIC 4 0 RB 1X27 (SUTURE) IMPLANT
SUT ETHILON 4 0 CL P 3 (SUTURE) IMPLANT
SUT MON AB 3-0 SH27 (SUTURE) ×2 IMPLANT
SUT PROLENE 6 0 PC 1 (SUTURE) IMPLANT
SUT STEEL 0 18XMFL TIE 17 (SUTURE) IMPLANT
SUT STEEL 1 (SUTURE) ×1 IMPLANT
SUT STEEL 2 (SUTURE) IMPLANT
SUT STEEL 4 (SUTURE) ×1 IMPLANT
SUT VIC AB 4-0 PS2 18 (SUTURE) IMPLANT
SUT VICRYL 3 0 UR 6 27 (SUTURE) IMPLANT
TOWEL GREEN STERILE FF (TOWEL DISPOSABLE) ×1 IMPLANT
TRAY ENT MC OR (CUSTOM PROCEDURE TRAY) ×1 IMPLANT
TRAY FOLEY MTR SLVR 14FR STAT (SET/KITS/TRAYS/PACK) IMPLANT

## 2024-10-09 NOTE — ED Notes (Signed)
 Pt given icepack for jaw

## 2024-10-09 NOTE — ED Notes (Signed)
 Ice pack provided to patient.

## 2024-10-09 NOTE — ED Notes (Signed)
 MD made aware pt requesting something additional for pain.
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
## 2024-10-09 NOTE — Anesthesia Preprocedure Evaluation (Addendum)
 Anesthesia Evaluation  Patient identified by MRN, date of birth, ID band Patient awake    Reviewed: Allergy & Precautions, H&P , NPO status , Patient's Chart, lab work & pertinent test results  History of Anesthesia Complications Negative for: history of anesthetic complications  Airway Mallampati: III  TM Distance: >3 FB Neck ROM: Full    Dental  (+) Teeth Intact   Pulmonary asthma , neg sleep apnea, neg COPD, Current Smoker and Patient abstained from smoking.   Pulmonary exam normal breath sounds clear to auscultation       Cardiovascular Exercise Tolerance: Good METShypertension, Pt. on medications (-) CAD and (-) Past MI (-) dysrhythmias  Rhythm:Regular Rate:Normal - Systolic murmurs    Neuro/Psych negative neurological ROS  negative psych ROS   GI/Hepatic Neg liver ROS,GERD  Medicated and Controlled,,  Endo/Other  diabetes, Type 2    Renal/GU negative Renal ROS  negative genitourinary   Musculoskeletal  (+) Arthritis ,    Abdominal   Peds  Hematology negative hematology ROS (+)   Anesthesia Other Findings Mandibular fx   Reproductive/Obstetrics negative OB ROS                              Anesthesia Physical Anesthesia Plan  ASA: 3  Anesthesia Plan: General   Post-op Pain Management: Ofirmev  IV (intra-op)*   Induction: Intravenous  PONV Risk Score and Plan: 2 and Ondansetron , Dexamethasone  and Treatment may vary due to age or medical condition  Airway Management Planned: Oral ETT and Video Laryngoscope Planned  Additional Equipment: None  Intra-op Plan:   Post-operative Plan: Extubation in OR  Informed Consent: I have reviewed the patients History and Physical, chart, labs and discussed the procedure including the risks, benefits and alternatives for the proposed anesthesia with the patient or authorized representative who has indicated his/her understanding and  acceptance.     Dental advisory given  Plan Discussed with: Anesthesiologist and CRNA  Anesthesia Plan Comments: (  )        Anesthesia Quick Evaluation

## 2024-10-09 NOTE — Anesthesia Procedure Notes (Signed)
 Procedure Name: Intubation Date/Time: 10/09/2024 5:46 PM  Performed by: Roslynn Waddell LABOR, CRNAPre-anesthesia Checklist: Patient identified, Emergency Drugs available, Suction available and Patient being monitored Patient Re-evaluated:Patient Re-evaluated prior to induction Oxygen  Delivery Method: Circle System Utilized Preoxygenation: Pre-oxygenation with 100% oxygen  Induction Type: IV induction Laryngoscope Size: Glidescope and 4 Grade View: Grade I Nasal Tubes: Nasal Rae and Nasal prep performed Tube size: 7.5 mm Number of attempts: 1 Placement Confirmation: ETT inserted through vocal cords under direct vision, positive ETCO2 and breath sounds checked- equal and bilateral Secured at: 30.5 cm Tube secured with: Tape Dental Injury: Teeth and Oropharynx as per pre-operative assessment  Comments: Patient spayed with afrin x2 each nare in preop. After induction, CRNA attempted to pass nasal ETT down R nare, but met resistance. CRNA did not push past resistance, but instead tried L nare. No resistance met while going down L nare. Tube easily passed through glottis. +EBBS and + EtCO2. Dentition and oral mucosa as per preop.

## 2024-10-09 NOTE — TOC CM/SW Note (Signed)
 Transition of Care [facility]) - Inpatient Brief Assessment   Patient Details  Name: Ethan [name]. MRN: [id number] Date of Birth: 1971/06/20  Transition of Care [facility]) CM/SW Contact:    Noreen KATHEE Cleotilde ISRAEL Phone Number: 10/09/2024, 10:14 AM   Clinical Narrative:   Inpatient Care Management (ICM) has reviewed patient and no other ICM needs have been identified at this time. We will continue to monitor patient advancement through interdisciplinary progression rounds. If new patient transition needs arise, please place a ICM consult.   Transition of Care Asessment: Insurance and Status: Insurance coverage has been reviewed Patient has primary care physician: Yes Home environment has been reviewed: From Home Prior level of function:: Independent Prior/Current Home Services: No current home services Social Drivers of Health Review: SDOH reviewed no interventions necessary Readmission risk has been reviewed: Yes Transition of care needs: no transition of care needs at this time
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
## 2024-10-09 NOTE — Transfer of Care (Signed)
 Immediate Anesthesia Transfer of Care Note  Patient: Ethan [name].  Procedure(s) Performed: OPEN REDUCTION INTERNAL FIXATION (ORIF) MANDIBULAR FRACTURE (Bilateral: Mouth)  Patient Location: PACU  Anesthesia Type:General  Level of Consciousness: awake, alert , and oriented  Airway & Oxygen  Therapy: Patient Spontanous Breathing and Patient connected to face mask oxygen   Post-op Assessment: Report given to RN and Post -op Vital signs reviewed and stable  Post vital signs: Reviewed and stable  Last Vitals:  Vitals Value Taken Time  BP 169/104 10/09/24 21:30  Temp 37.1 C 10/09/24 21:24  Pulse 107 10/09/24 21:35  Resp 26 10/09/24 21:35  SpO2 94 % 10/09/24 21:35  Vitals shown include unfiled device data.  Last Pain:  Vitals:   10/09/24 2124  TempSrc:   PainSc: Asleep         Complications: No notable events documented.

## 2024-10-09 NOTE — Plan of Care (Signed)
  Problem: Education: Goal: Ability to describe self-care measures that may prevent or decrease complications (Diabetes Survival Skills Education) will improve Outcome: Not Progressing Goal: Individualized Educational Video(s) Outcome: Not Progressing   Problem: Coping: Goal: Ability to adjust to condition or change in health will improve Outcome: Not Progressing   Problem: Nutritional: Goal: Maintenance of adequate nutrition will improve Outcome: Not Progressing   Problem: Skin Integrity: Goal: Risk for impaired skin integrity will decrease Outcome: Not Progressing   Problem: Tissue Perfusion: Goal: Adequacy of tissue perfusion will improve Outcome: Not Progressing   Problem: Education: Goal: Knowledge of General Education information will improve Description: Including pain rating scale, medication(s)/side effects and non-pharmacologic comfort measures Outcome: Not Progressing   Problem: Health Behavior/Discharge Planning: Goal: Ability to manage health-related needs will improve Outcome: Not Progressing  Addendum: Unable to complete admission at this time due to patient's drowsiness and inability to speak for a long period of time due to surgical site located at the jaw.
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
## 2024-10-10 ENCOUNTER — Encounter (HOSPITAL_COMMUNITY): Payer: Self-pay

## 2024-10-10 DIAGNOSIS — E1165 Type 2 diabetes mellitus with hyperglycemia: Secondary | ICD-10-CM

## 2024-10-10 DIAGNOSIS — I10 Essential (primary) hypertension: Secondary | ICD-10-CM | POA: Diagnosis not present

## 2024-10-10 DIAGNOSIS — K1370 Unspecified lesions of oral mucosa: Secondary | ICD-10-CM

## 2024-10-10 DIAGNOSIS — S02650D Fracture of angle of mandible, unspecified side, subsequent encounter for fracture with routine healing: Secondary | ICD-10-CM

## 2024-10-10 DIAGNOSIS — S02609K Fracture of mandible, unspecified, subsequent encounter for fracture with nonunion: Secondary | ICD-10-CM | POA: Diagnosis not present

## 2024-10-10 DIAGNOSIS — S02670A Fracture of alveolus of mandible, unspecified side, initial encounter for closed fracture: Secondary | ICD-10-CM

## 2024-10-10 LAB — GLUCOSE, CAPILLARY
Glucose-Capillary: 140 mg/dL — ABNORMAL HIGH (ref 70–99)
Glucose-Capillary: 142 mg/dL — ABNORMAL HIGH (ref 70–99)
Glucose-Capillary: 147 mg/dL — ABNORMAL HIGH (ref 70–99)
Glucose-Capillary: 158 mg/dL — ABNORMAL HIGH (ref 70–99)
Glucose-Capillary: 171 mg/dL — ABNORMAL HIGH (ref 70–99)
Glucose-Capillary: 171 mg/dL — ABNORMAL HIGH (ref 70–99)

## 2024-10-10 LAB — CBC
HCT: 45.1 % (ref 39.0–52.0)
Hemoglobin: 14.7 g/dL (ref 13.0–17.0)
MCH: 32.6 pg (ref 26.0–34.0)
MCHC: 32.6 g/dL (ref 30.0–36.0)
MCV: 100 fL (ref 80.0–100.0)
Platelets: 216 K/uL (ref 150–400)
RBC: 4.51 MIL/uL (ref 4.22–5.81)
RDW: 13.7 % (ref 11.5–15.5)
WBC: 12.6 K/uL — ABNORMAL HIGH (ref 4.0–10.5)
nRBC: 0 % (ref 0.0–0.2)

## 2024-10-10 LAB — BASIC METABOLIC PANEL WITH GFR
Anion gap: 16 — ABNORMAL HIGH (ref 5–15)
BUN: 11 mg/dL (ref 6–20)
CO2: 23 mmol/L (ref 22–32)
Calcium: 9.6 mg/dL (ref 8.9–10.3)
Chloride: 99 mmol/L (ref 98–111)
Creatinine, Ser: 1.09 mg/dL (ref 0.61–1.24)
GFR, Estimated: >60 mL/min (ref >60)
Glucose, Bld: 165 mg/dL — ABNORMAL HIGH (ref 70–99)
Potassium: 4.6 mmol/L (ref 3.5–5.1)
Sodium: 138 mmol/L (ref 135–145)

## 2024-10-10 MED ORDER — PANTOPRAZOLE SODIUM 40 MG IV SOLR
40.0000 mg | INTRAVENOUS | Status: DC
  Administered 2024-10-10 – 2024-10-12 (×3): 40 mg via INTRAVENOUS
  Filled 2024-10-10 (×3): qty 10

## 2024-10-10 MED ORDER — ENSURE PLUS HIGH PROTEIN PO LIQD
237.0000 mL | Freq: Three times a day (TID) | ORAL | Status: DC
  Administered 2024-10-10 – 2024-10-13 (×8): 237 mL via ORAL

## 2024-10-10 MED ORDER — FENTANYL CITRATE (PF) 50 MCG/ML IJ SOSY
12.5000 ug | PREFILLED_SYRINGE | INTRAMUSCULAR | Status: DC | PRN
  Administered 2024-10-10 – 2024-10-11 (×4): 12.5 ug via INTRAVENOUS
  Filled 2024-10-10 (×4): qty 1
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
## 2024-10-10 NOTE — Progress Notes (Signed)
 Transition of Care [facility]) - CAGE-AID Screening   Patient Details  Name: Ethan [name]. MRN: [id number] Date of Birth: 23-Aug-1971  Transition of Care [facility]) CM/SW Contact:    Bernardino Mayotte, RN Phone Number: 10/10/2024, 9:29 PM   Clinical Narrative:  Patient endorses occasional alcohol consumption, denies illicit drugs. Resources not given.   CAGE-AID Screening:    Have You Ever Felt You Ought to Cut Down on Your Drinking or Drug Use?: No Have People Annoyed You By Critizing Your Drinking Or Drug Use?: No Have You Felt Bad Or Guilty About Your Drinking Or Drug Use?: No Have You Ever Had a Drink or Used Drugs First Thing In The Morning to Steady Your Nerves or to Get Rid of a Hangover?: No CAGE-AID Score: 0  Substance Abuse Education Offered: No
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
## 2024-10-10 NOTE — Plan of Care (Signed)

## 2024-10-10 NOTE — Anesthesia Postprocedure Evaluation (Signed)
 Anesthesia Post Note  Patient: Ethan [name].  Procedure(s) Performed: OPEN REDUCTION INTERNAL FIXATION (ORIF) MANDIBULAR FRACTURE (Bilateral: Mouth)     Patient location during evaluation: PACU Anesthesia Type: General Level of consciousness: awake Pain management: pain level controlled Vital Signs Assessment: post-procedure vital signs reviewed and stable Respiratory status: spontaneous breathing, nonlabored ventilation and respiratory function stable Cardiovascular status: blood pressure returned to baseline and stable Postop Assessment: no apparent nausea or vomiting Anesthetic complications: no   No notable events documented.  Last Vitals:  Vitals:   10/10/24 0440 10/10/24 0759  BP: (!) 159/92 (!) 167/91  Pulse: 93 92  Resp: 18 18  Temp: 36.5 C   SpO2: 96% 96%    Last Pain:  Vitals:   10/10/24 0802  TempSrc:   PainSc: 10-Worst pain ever                 Katha Kuehne P Gordon Vandunk
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
## 2024-10-10 NOTE — Discharge Instructions (Addendum)
 Nutrition Post Hospital Stay Proper nutrition can help your body recover from illness and injury.   Foods and beverages high in protein, vitamins, and minerals help rebuild muscle loss, promote healing, & reduce fall risk.   In addition to eating healthy foods, a nutrition shake is an easy, delicious way to get the nutrition you need during and after your hospital stay  It is recommended that you continue to drink 2-3 bottles per day of:  Ensure Plus High Protein for at least 1 month (30 days) after your hospital stay   Tips for adding a nutrition shake into your routine: Enjoy one as a tasty mid-morning or afternoon snack Drink cold or make a milkshake out of it Use as a coffee creamer   Available at the following grocery stores and pharmacies:           * [place] [facility] * [employer] * [employer]  * [employer]          * [employer] * [employer]  * [facility]      * Target  * [employer]'s   * [facility]  * [employer]   * [facility] [phone]            For COUPONS visit: www.[email or web address]/join or [email or web address]   Suggested Substitutions Ensure Plus = Boost Plus = Carnation Breakfast Essentials       Keep incisions clean and dry for the first 48 hours.  Medications: Take the oxycodone  5mg  that you have at home  Take prescribed antibiotics as directed to prevent infection. Afrin as needed for nose bleeds Tylenol  okay as directed for pain.  Take pain medications as needed. Do not exceed the recommended dose.  You may be prescribed a steroid to reduce swelling - take it exactly as prescribed.  If you were given eye drops or ointments, use them as directed.  Activity Restrictions: No vigorous exercise Keep heart rate below 120 bpm Walking or elliptical under mile or under 20 minutes No biking or running No nose blowing for at least 2 weeks - this can force air into the orbit and cause complications.  Avoid heavy lifting, bending over, or straining for at least 2  weeks.  Do not engage in contact sports or any activity with risk of facial trauma for at least 6-8 weeks.  Sleep with your head elevated on 2 pillows to reduce swelling for the first week.  Diet:  Soft diet for the first 3 weeks to minimize jaw movement and stress on the facial bones.  Stay well-hydrated.  Eye Care and Vision:  You may have swelling, bruising, or double vision (diplopia) temporarily after surgery.  Avoid rubbing the eye or applying pressure.  Wear sunglasses when outdoors to protect the eye.  Report any worsening or new-onset vision changes, eye pain, or bulging of the eye immediately.  Signs and Symptoms to Watch For:  Call your surgeon or seek immediate medical attention if you experience:  Fever >101.57F (38.6C)  Persistent or worsening facial pain or swelling  Redness or pus at the incision site  Sudden change in vision or eye movement  Numbness worsening or not improving  Bleeding from the nose or mouth that won't stop  Difficulty breathing or swallowing  Follow-Up:  Follow up in 6 days with your facial trauma or ENT/plastic surgeon. Call for appoitnment.   Additional Instructions: Avoid NSAIDS Avoid smoking or vaping - these impair healing. Avoid alcohol while taking pain medications or antibiotics.  Bone healing estimates 1 Month (4 Weeks): Strength: ~10-20% of full strength  2 Months (8 Weeks): Strength: ~40-50% of full strength  3 Months (12 Weeks): Strength: ~60-70% of full strength  4-5 Months (16-20 Weeks): Strength: ~80-90% of full strength  6 Months (24 Weeks): Functional healing is typically complete Strength: ~90-100% of full strength   Recommendations to Support Bone Healing: Ensure adequate calcium (1,000-1,200 mg/day) and vitamin D (800-2,000 IU/day). Maintain protein intake for collagen and bone matrix. Avoid NSAIDs in the first few weeks if possible (may impair bone healing). Follow up with your surgeon  for imaging if healing is delayed.  MMF/ Wired Jaw Instructions and Discharge:   Call your doctor or go to the emergency room if you have:  - Fever of 101 degrees or higher - Severe pain that has increased greatly since your surgery or is uncontrolled by your current pain medications - Nausea and vomiting that does not go away - Chest pain/shortness of breath - Any other acute events, problems, or concerns  Diet STRICT LIQUID DIET ONLY Supplement your diet with protein shakes and multivitamins to prevent weight loss You are in charge of your own nutrition! Keep up with your diet and avoid weight loss by eating frequently! Drink lots of water   Wound care KEEP WIRE CUTTERS WITH YOU AT ALL TIMES. These are to be used in cases of emergency only to cut wires - example: you are going to throw up USE WATER  PIK (AVAILABLE AT [employer], [employer],ETC) TO BRUSH TEETH- USE THE LOWEST SETTING. DO THIS AT LEAST TWICE PER DAY instead of the traditional brushing of your teeth. It is very important that you practice meticulous dental care while the wires and bars are in place.  Use Peridex  mouthwash twice daily for 10 days after surgery after meals and rinse your mouth out with warm water  after each meal.  If you are more than 10 days out from surgery OR peridex  is too expensive, use alcohol free mouth wash (like Listerine Zero) twice per day If you have incisions on outside of your face, apply bacitracin  ointment as needed for 5 days after surgery twice per day; then switch to vaseline Use dental wax around your arch bars to help keep your gums from getting sore. You can purchase more dental wax at your local orthodontist usually or order online. Just press the wax around the places in your Arch bars that are making your gums sore- change out as needed.   Pain Management Take your prescribed narcotic pain medication every 4-6 hours as needed. Do not take more than prescribed Do not take additional  tylenol /acetaminophen  while you are taking this pain medication, as it has tylenol  in it.  You can take Ibuprofen 400-600 mg every 6 hours as needed as long as you do not have Kidney disease No driving or operating heavy machinery while on the narcotic pain medications It is normal to have significant pain for the first week or so. It should ease off after that, but you will still intermittently have pain requiring pain medication while the bars are in place.   Plan Normally, the wires are worn for at least 2 weeks, then stiff rubber bands are worn for 2 weeks, then 2 weeks without rubber bands, then the arch bars can sometimes come off Changing from wires to rubber bands and getting your arch bars removed depends a lot on you- sticking to your dietary and dental care instructions will help your healing process go as  expected. Arch bars are in place to help your occlusion, meaning the way your teeth fit together. They are important and difficult tools to replace if your teeth are not fitting together properly. If you have an emergency and have to cut the wires out, please let our office know as soon as possible so we can schedule replacing them Call us  with questions or concerns  Follow Up:  - A follow up appointment has been scheduled for you as outlined below: @FUTUREAPPTS @  - DO NOT MIX NARCOTIC PAIN MEDICATIONS OR TAKE NARCOTIC PRESCRIPTIONS AT THE SAME TIME (PERCODET, LORTAB, ROXICODONE , ETC.) - DO NOT DRIVE OR OPERATE HEAVY MACHINERY WHILE ON NARCOTICS
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
## 2024-10-11 DIAGNOSIS — S02601B Fracture of unspecified part of body of right mandible, initial encounter for open fracture: Secondary | ICD-10-CM

## 2024-10-11 LAB — GLUCOSE, CAPILLARY
Glucose-Capillary: 107 mg/dL — ABNORMAL HIGH (ref 70–99)
Glucose-Capillary: 116 mg/dL — ABNORMAL HIGH (ref 70–99)
Glucose-Capillary: 130 mg/dL — ABNORMAL HIGH (ref 70–99)
Glucose-Capillary: 145 mg/dL — ABNORMAL HIGH (ref 70–99)
Glucose-Capillary: 152 mg/dL — ABNORMAL HIGH (ref 70–99)
Glucose-Capillary: 157 mg/dL — ABNORMAL HIGH (ref 70–99)

## 2024-10-11 LAB — SURGICAL PATHOLOGY

## 2024-10-11 MED ORDER — AMLODIPINE BESYLATE 10 MG PO TABS
10.0000 mg | ORAL_TABLET | ORAL | Status: DC
  Administered 2024-10-11 – 2024-10-13 (×3): 10 mg via ORAL
  Filled 2024-10-11 (×3): qty 1

## 2024-10-11 MED ORDER — ALLOPURINOL 100 MG PO TABS
300.0000 mg | ORAL_TABLET | ORAL | Status: DC
  Administered 2024-10-12 – 2024-10-13 (×2): 300 mg via ORAL
  Filled 2024-10-11 (×2): qty 3

## 2024-10-11 MED ORDER — LISINOPRIL 20 MG PO TABS
20.0000 mg | ORAL_TABLET | ORAL | Status: DC
  Administered 2024-10-11 – 2024-10-13 (×3): 20 mg via ORAL
  Filled 2024-10-11 (×3): qty 1

## 2024-10-11 MED ORDER — PRAVASTATIN SODIUM 40 MG PO TABS
40.0000 mg | ORAL_TABLET | Freq: Every day | ORAL | Status: DC
  Administered 2024-10-11 – 2024-10-12 (×2): 40 mg via ORAL
  Filled 2024-10-11 (×2): qty 1

## 2024-10-11 MED ORDER — OXYCODONE HCL 5 MG/5ML PO SOLN
5.0000 mg | ORAL | Status: DC | PRN
  Administered 2024-10-11 – 2024-10-13 (×12): 10 mg via ORAL
  Filled 2024-10-11 (×12): qty 10

## 2024-10-11 MED ORDER — HYDROMORPHONE HCL 1 MG/ML IJ SOLN
1.0000 mg | INTRAMUSCULAR | Status: DC | PRN
  Administered 2024-10-11 – 2024-10-13 (×11): 1 mg via INTRAVENOUS
  Filled 2024-10-11 (×12): qty 1

## 2024-10-11 NOTE — Plan of Care (Signed)
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
## 2024-10-11 NOTE — Plan of Care (Signed)

## 2024-10-12 DIAGNOSIS — S02601B Fracture of unspecified part of body of right mandible, initial encounter for open fracture: Secondary | ICD-10-CM | POA: Diagnosis not present

## 2024-10-12 LAB — GLUCOSE, CAPILLARY
Glucose-Capillary: 148 mg/dL — ABNORMAL HIGH (ref 70–99)
Glucose-Capillary: 132 mg/dL — ABNORMAL HIGH (ref 70–99)
Glucose-Capillary: 142 mg/dL — ABNORMAL HIGH (ref 70–99)
Glucose-Capillary: 125 mg/dL — ABNORMAL HIGH (ref 70–99)
Glucose-Capillary: 134 mg/dL — ABNORMAL HIGH (ref 70–99)

## 2024-10-12 MED ORDER — CEFDINIR 300 MG PO CAPS
300.0000 mg | ORAL_CAPSULE | Freq: Two times a day (BID) | ORAL | Status: DC
  Filled 2024-10-12 (×2): qty 1

## 2024-10-12 MED ORDER — METRONIDAZOLE 50 MG/ML ORAL SUSPENSION
500.0000 mg | Freq: Two times a day (BID) | ORAL | Status: DC
  Administered 2024-10-12 – 2024-10-13 (×2): 500 mg via ORAL
  Filled 2024-10-12 (×4): qty 10

## 2024-10-12 MED ORDER — METRONIDAZOLE 50 MG/ML ORAL SUSPENSION
500.0000 mg | Freq: Two times a day (BID) | ORAL | Status: DC
  Filled 2024-10-12 (×2): qty 10

## 2024-10-12 MED ORDER — METRONIDAZOLE 500 MG PO TABS: 500.0000 mg | ORAL_TABLET | Freq: Two times a day (BID) | ORAL | Status: DC

## 2024-10-12 MED ORDER — CEFPODOXIME PROXETIL 100 MG/5ML PO SUSR
200.0000 mg | Freq: Two times a day (BID) | ORAL | Status: DC
  Administered 2024-10-12 – 2024-10-13 (×2): 200 mg via ORAL
  Filled 2024-10-12 (×5): qty 10
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
## 2024-10-12 NOTE — TOC Initial Note (Signed)
 Transition of Care (TOC) - Initial/Assessment Note    Patient Details  Name: Ethan [name]. MRN: [id number] Date of Birth: 1971-09-29  Transition of Care [facility]) CM/SW Contact:    Roxie KANDICE Stain, RN Phone Number: 10/12/2024, 3:50 PM  Clinical Narrative:                  Spoke to patient regarding transition needs.  Patient requesting suction machine. Notified Mitch with [employer]. Suction machine has been delivered to bedside.  ICM (Inpatient Care Management) will continue to follow for needs. Expected Discharge Plan: Home/Self Care Barriers to Discharge: Continued Medical Work up   Patient Goals and CMS Choice Patient states their goals for this hospitalization and ongoing recovery are:: return home          Expected Discharge Plan and Services       Living arrangements for the past 2 months: Single Family Home                 DME Arranged: Suction DME Agency: [employer] Date DME Agency Contacted: 10/12/24 Time DME Agency Contacted: [id number] Representative spoke with at DME Agency: Mitch            Prior Living Arrangements/Services Living arrangements for the past 2 months: Single Family Home   Patient language and need for interpreter reviewed:: Yes Do you feel safe going back to the place where you live?: Yes      Need for Family Participation in Patient Care: Yes (Comment) Care giver support system in place?: Yes (comment)   Criminal Activity/Legal Involvement Pertinent to Current Situation/Hospitalization: No - Comment as needed  Activities of Daily Living   ADL Screening (condition at time of admission) Independently performs ADLs?: Yes (appropriate for developmental age) Is the patient deaf or have difficulty hearing?: No Does the patient have difficulty seeing, even when wearing glasses/contacts?: No Does the patient have difficulty concentrating, remembering, or making decisions?: No  Permission Sought/Granted                  Emotional  Assessment Appearance:: Appears stated age Attitude/Demeanor/Rapport: Engaged Affect (typically observed): Accepting Orientation: : Oriented to Self, Oriented to Place, Oriented to  Time, Oriented to Situation Alcohol / Substance Use: Not Applicable Psych Involvement: No (comment)  Admission diagnosis:  Open fracture of mandible (HCC) [S02.609B] Open fracture of mandible, unspecified laterality, unspecified mandibular site, initial encounter [facility]) [S02.609B] Patient Active Problem List   Diagnosis Date Noted   Closed fracture of angle of mandible with routine healing 10/10/2024   Closed comminuted fracture of mandibular alveolar bone (HCC) 10/10/2024   Lesion of buccal mucosa 10/10/2024   Open fracture of mandible (HCC) 10/08/2024   Acute diverticulitis 08/12/2024   Diabetes mellitus (HCC) 08/12/2024   High cholesterol 12/21/2023   Elevated glucose 06/25/2014   GERD (gastroesophageal reflux disease) 11/09/2013   Intractable hiccups 11/09/2013   Cellulitis and abscess of leg, except foot 08/11/2013   Acute gouty arthritis 08/10/2013   Hypokalemia 08/09/2013   Tobacco use disorder 08/09/2013   Hypertension 07/14/2011   PCP:  [facility] Pharm[facility]:   [id number] - [place], [place] - [address] AT [facility] [address] [place] [address] Phone: [phone] Fax: [phone]     Social Drivers of Health (SDOH) Social History: SDOH Screenings   Food Insecurity: No Food Insecurity (10/10/2024)  Housing: Low Risk  (10/10/2024)  Transportation Needs: No Transportation Needs (10/10/2024)  Utilities: Not At Risk (10/10/2024)  Financial  Resource Strain: Low Risk (06/19/2024)   Received from [facility]  Physical Activity: Insufficiently Active (06/19/2024)   Received from [facility]  Social Connections: Moderately Integrated (06/19/2024)   Received from [facility]  Stress: No Stress Concern Present (06/19/2024)    Received from [facility]  Tobacco Use: High Risk (10/09/2024)  Health Literacy: Low Risk (06/19/2024)   Received from [facility]   SDOH Interventions:     Readmission Risk Interventions    10/09/2024   10:13 AM  Readmission Risk Prevention Plan  Transportation Screening Complete  Home Care Screening Complete  Medication Review (RN CM) Complete

## 2024-10-13 ENCOUNTER — Other Ambulatory Visit (HOSPITAL_COMMUNITY): Payer: Self-pay

## 2024-10-13 DIAGNOSIS — S02601B Fracture of unspecified part of body of right mandible, initial encounter for open fracture: Secondary | ICD-10-CM | POA: Diagnosis not present

## 2024-10-13 LAB — GLUCOSE, CAPILLARY
Glucose-Capillary: 135 mg/dL — ABNORMAL HIGH (ref 70–99)
Glucose-Capillary: 144 mg/dL — ABNORMAL HIGH (ref 70–99)
Glucose-Capillary: 151 mg/dL — ABNORMAL HIGH (ref 70–99)
Glucose-Capillary: 167 mg/dL — ABNORMAL HIGH (ref 70–99)

## 2024-10-13 MED ORDER — CEFPODOXIME PROXETIL 100 MG/5ML PO SUSR
200.0000 mg | Freq: Two times a day (BID) | ORAL | 0 refills | Status: DC
  Filled 2024-10-13: qty 100, 5d supply, fill #0

## 2024-10-13 MED ORDER — OXYCODONE HCL 5 MG/5ML PO SOLN
10.0000 mg | Freq: Four times a day (QID) | ORAL | 0 refills | Status: DC | PRN
  Filled 2024-10-13: qty 205, 5d supply, fill #0

## 2024-10-13 MED ORDER — POLYETHYLENE GLYCOL 3350 17 GM/SCOOP PO POWD
17.0000 g | Freq: Every day | ORAL | 0 refills | Status: AC | PRN
  Filled 2024-10-13: qty 238, 14d supply, fill #0

## 2024-10-13 MED ORDER — METRONIDAZOLE 50 MG/ML ORAL SUSPENSION: 500.0000 mg | Freq: Two times a day (BID) | ORAL | 0 refills | Status: DC

## 2024-10-13 MED ORDER — ENSURE PLUS HIGH PROTEIN PO LIQD
237.0000 mL | Freq: Three times a day (TID) | ORAL | 0 refills | Status: AC
  Filled 2024-10-13: qty 21330, 30d supply, fill #0

## 2024-10-13 MED ORDER — METRONIDAZOLE 500 MG PO TABS
500.0000 mg | ORAL_TABLET | Freq: Two times a day (BID) | ORAL | 0 refills | Status: DC
  Filled 2024-10-13: qty 8, 4d supply, fill #0

## 2024-10-13 MED ORDER — CEFDINIR 250 MG/5ML PO SUSR: 300.0000 mg | Freq: Two times a day (BID) | ORAL | 0 refills | Status: DC

## 2024-10-13 MED ORDER — METRONIDAZOLE 50 MG/ML ORAL SUSPENSION
500.0000 mg | Freq: Two times a day (BID) | ORAL | 0 refills | Status: DC
  Filled 2024-10-13: qty 100, 5d supply, fill #0

## 2024-10-13 NOTE — Plan of Care (Signed)

## 2024-10-13 NOTE — Progress Notes (Signed)
 Patient discharged, belongings returned to patient, with him in his room. Has been educated on medications, including pain  medication and how often to take . Ivs have been discontinued. Will go home with home suction. Patient will go home at discharge. Education given on when to call the doctor if there are s/s of infection.
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
## 2024-10-13 NOTE — Plan of Care (Signed)
  Problem: Education: Goal: Ability to describe self-care measures that may prevent or decrease complications (Diabetes Survival Skills Education) will improve Outcome: Progressing   Problem: Coping: Goal: Ability to adjust to condition or change in health will improve Outcome: Progressing

## 2024-10-13 NOTE — Plan of Care (Signed)
 Problem: Education: Goal: Ability to describe self-care measures that may prevent or decrease complications (Diabetes Survival Skills Education) will improve 10/13/2024 1445 by Burnard Almarie BROCKS, RN Outcome: Adequate for Discharge 10/13/2024 0744 by Burnard Almarie BROCKS, RN Outcome: Progressing Goal: Individualized Educational Video(s) 10/13/2024 1445 by Burnard Almarie BROCKS, RN Outcome: Adequate for Discharge 10/13/2024 0744 by Burnard Almarie BROCKS, RN Outcome: Progressing   Problem: Coping: Goal: Ability to adjust to condition or change in health will improve 10/13/2024 1445 by Burnard Almarie BROCKS, RN Outcome: Adequate for Discharge 10/13/2024 0744 by Burnard Almarie BROCKS, RN Outcome: Progressing   Problem: Fluid Volume: Goal: Ability to maintain a balanced intake and output will improve 10/13/2024 1445 by Burnard Almarie BROCKS, RN Outcome: Adequate for Discharge 10/13/2024 0744 by Burnard Almarie BROCKS, RN Outcome: Progressing   Problem: Health Behavior/Discharge Planning: Goal: Ability to identify and utilize available resources and services will improve 10/13/2024 1445 by Burnard Almarie BROCKS, RN Outcome: Adequate for Discharge 10/13/2024 0744 by Burnard Almarie BROCKS, RN Outcome: Progressing Goal: Ability to manage health-related needs will improve 10/13/2024 1445 by Burnard Almarie BROCKS, RN Outcome: Adequate for Discharge 10/13/2024 0744 by Burnard Almarie BROCKS, RN Outcome: Progressing   Problem: Metabolic: Goal: Ability to maintain appropriate glucose levels will improve 10/13/2024 1445 by Burnard Almarie BROCKS, RN Outcome: Adequate for Discharge 10/13/2024 0744 by Burnard Almarie BROCKS, RN Outcome: Progressing   Problem: Nutritional: Goal: Maintenance of adequate nutrition will improve 10/13/2024 1445 by Burnard Almarie BROCKS, RN Outcome: Adequate for Discharge 10/13/2024 0744 by Burnard Almarie BROCKS, RN Outcome: Progressing Goal: Progress toward achieving an optimal weight will  improve 10/13/2024 1445 by Burnard Almarie BROCKS, RN Outcome: Adequate for Discharge 10/13/2024 0744 by Burnard Almarie BROCKS, RN Outcome: Progressing   Problem: Skin Integrity: Goal: Risk for impaired skin integrity will decrease 10/13/2024 1445 by Burnard Almarie BROCKS, RN Outcome: Adequate for Discharge 10/13/2024 0744 by Burnard Almarie BROCKS, RN Outcome: Progressing   Problem: Tissue Perfusion: Goal: Adequacy of tissue perfusion will improve 10/13/2024 1445 by Burnard Almarie BROCKS, RN Outcome: Adequate for Discharge 10/13/2024 0744 by Burnard Almarie BROCKS, RN Outcome: Progressing   Problem: Education: Goal: Knowledge of General Education information will improve Description: Including pain rating scale, medication(s)/side effects and non-pharmacologic comfort measures 10/13/2024 1445 by Burnard Almarie BROCKS, RN Outcome: Adequate for Discharge 10/13/2024 0744 by Burnard Almarie BROCKS, RN Outcome: Progressing   Problem: Health Behavior/Discharge Planning: Goal: Ability to manage health-related needs will improve 10/13/2024 1445 by Burnard Almarie BROCKS, RN Outcome: Adequate for Discharge 10/13/2024 0744 by Burnard Almarie BROCKS, RN Outcome: Progressing   Problem: Clinical Measurements: Goal: Ability to maintain clinical measurements within normal limits will improve 10/13/2024 1445 by Burnard Almarie BROCKS, RN Outcome: Adequate for Discharge 10/13/2024 0744 by Burnard Almarie BROCKS, RN Outcome: Progressing Goal: Will remain free from infection 10/13/2024 1445 by Burnard Almarie BROCKS, RN Outcome: Adequate for Discharge 10/13/2024 0744 by Burnard Almarie BROCKS, RN Outcome: Progressing Goal: Diagnostic test results will improve 10/13/2024 1445 by Burnard Almarie BROCKS, RN Outcome: Adequate for Discharge 10/13/2024 0744 by Burnard Almarie BROCKS, RN Outcome: Progressing Goal: Respiratory complications will improve 10/13/2024 1445 by Burnard Almarie BROCKS, RN Outcome: Adequate for Discharge 10/13/2024 0744 by  Burnard Almarie BROCKS, RN Outcome: Progressing Goal: Cardiovascular complication will be avoided 10/13/2024 1445 by Burnard Almarie BROCKS, RN Outcome: Adequate for Discharge 10/13/2024 0744 by Burnard Almarie BROCKS, RN Outcome: Progressing   Problem: Activity: Goal: Risk for activity intolerance will decrease 10/13/2024 1445 by Burnard Almarie BROCKS, RN Outcome: Adequate for Discharge  10/13/2024 0744 by Burnard Almarie BROCKS, RN Outcome: Progressing   Problem: Nutrition: Goal: Adequate nutrition will be maintained 10/13/2024 1445 by Burnard Almarie BROCKS, RN Outcome: Adequate for Discharge 10/13/2024 0744 by Burnard Almarie BROCKS, RN Outcome: Progressing   Problem: Coping: Goal: Level of anxiety will decrease 10/13/2024 1445 by Burnard Almarie BROCKS, RN Outcome: Adequate for Discharge 10/13/2024 0744 by Burnard Almarie BROCKS, RN Outcome: Progressing   Problem: Elimination: Goal: Will not experience complications related to bowel motility 10/13/2024 1445 by Burnard Almarie BROCKS, RN Outcome: Adequate for Discharge 10/13/2024 0744 by Burnard Almarie BROCKS, RN Outcome: Progressing Goal: Will not experience complications related to urinary retention 10/13/2024 1445 by Burnard Almarie BROCKS, RN Outcome: Adequate for Discharge 10/13/2024 0744 by Burnard Almarie BROCKS, RN Outcome: Progressing   Problem: Pain Managment: Goal: General experience of comfort will improve and/or be controlled 10/13/2024 1445 by Burnard Almarie BROCKS, RN Outcome: Adequate for Discharge 10/13/2024 0744 by Burnard Almarie BROCKS, RN Outcome: Progressing   Problem: Safety: Goal: Ability to remain free from injury will improve 10/13/2024 1445 by Burnard Almarie BROCKS, RN Outcome: Adequate for Discharge 10/13/2024 0744 by Burnard Almarie BROCKS, RN Outcome: Progressing   Problem: Skin Integrity: Goal: Risk for impaired skin integrity will decrease 10/13/2024 1445 by Burnard Almarie BROCKS, RN Outcome: Adequate for Discharge 10/13/2024 0744 by  Burnard Almarie BROCKS, RN Outcome: Progressing   Problem: Increased Nutrient Needs (NI-5.1) Goal: Food and/or nutrient delivery Description: Individualized approach for food/nutrient provision. Outcome: Adequate for Discharge

## 2024-10-14 ENCOUNTER — Other Ambulatory Visit (HOSPITAL_COMMUNITY): Payer: Self-pay

## 2024-10-15 ENCOUNTER — Telehealth (INDEPENDENT_AMBULATORY_CARE_PROVIDER_SITE_OTHER): Payer: Self-pay

## 2024-10-15 ENCOUNTER — Ambulatory Visit (INDEPENDENT_AMBULATORY_CARE_PROVIDER_SITE_OTHER): Payer: MEDICAID | Admitting: Internal Medicine

## 2024-10-15 ENCOUNTER — Encounter: Payer: Self-pay | Admitting: Internal Medicine

## 2024-10-15 VITALS — BP 109/75 | HR 90 | Temp 98.1°F | Ht 75.0 in | Wt 279.4 lb

## 2024-10-15 DIAGNOSIS — K529 Noninfective gastroenteritis and colitis, unspecified: Secondary | ICD-10-CM

## 2024-10-15 DIAGNOSIS — S02602B Fracture of unspecified part of body of left mandible, initial encounter for open fracture: Secondary | ICD-10-CM

## 2024-10-15 DIAGNOSIS — K5732 Diverticulitis of large intestine without perforation or abscess without bleeding: Secondary | ICD-10-CM | POA: Diagnosis not present

## 2024-10-15 DIAGNOSIS — K219 Gastro-esophageal reflux disease without esophagitis: Secondary | ICD-10-CM

## 2024-10-15 DIAGNOSIS — S02651D Fracture of angle of right mandible, subsequent encounter for fracture with routine healing: Secondary | ICD-10-CM

## 2024-10-15 NOTE — Telephone Encounter (Signed)
 Patient called stating he needed more medication for pain. He is scheduled to see you 10/16/2024 at 11:15am. Please advise

## 2024-10-15 NOTE — Telephone Encounter (Signed)
 The patient called in, he is reporting he is in an extreme amount of pain and requests to come in today to see you.  We currently have him scheduled for tomorrow for a PO. Please advise.

## 2024-10-15 NOTE — Patient Instructions (Addendum)
 It was good to see you again today  As discussed, it appears that you have a bout of colitis probably secondary to infection.  Your symptoms have totally resolved.  Since you had a recent colonoscopy you do not need another 1 for 10 years  I do recommend Benefiber 1 to 2 tablespoons every day in 8 ounces of water   Unless something comes up, plan to see you back in 1 year.

## 2024-10-16 ENCOUNTER — Encounter: Payer: MEDICAID | Admitting: Nutrition

## 2024-10-16 ENCOUNTER — Telehealth (INDEPENDENT_AMBULATORY_CARE_PROVIDER_SITE_OTHER): Payer: Self-pay

## 2024-10-16 ENCOUNTER — Ambulatory Visit (INDEPENDENT_AMBULATORY_CARE_PROVIDER_SITE_OTHER): Payer: MEDICAID

## 2024-10-16 ENCOUNTER — Telehealth: Payer: Self-pay | Admitting: Nutrition

## 2024-10-16 VITALS — BP 124/65 | HR 108 | Temp 98.0°F | Wt 280.0 lb

## 2024-10-16 DIAGNOSIS — Z720 Tobacco use: Secondary | ICD-10-CM | POA: Diagnosis not present

## 2024-10-16 DIAGNOSIS — S02602G Fracture of unspecified part of body of left mandible, subsequent encounter for fracture with delayed healing: Secondary | ICD-10-CM

## 2024-10-16 DIAGNOSIS — Z794 Long term (current) use of insulin: Secondary | ICD-10-CM

## 2024-10-16 DIAGNOSIS — K5903 Drug induced constipation: Secondary | ICD-10-CM

## 2024-10-16 DIAGNOSIS — E11630 Type 2 diabetes mellitus with periodontal disease: Secondary | ICD-10-CM

## 2024-10-16 MED ORDER — NICOTINE 7 MG/24HR TD PT24: MEDICATED_PATCH | TRANSDERMAL | 0 refills | Status: AC

## 2024-10-16 MED ORDER — CEFDINIR 250 MG/5ML PO SUSR: 300.0000 mg | Freq: Two times a day (BID) | ORAL | 0 refills | Status: AC

## 2024-10-16 MED ORDER — OXYCODONE HCL 5 MG/5ML PO SOLN: 10.0000 mg | Freq: Four times a day (QID) | ORAL | 0 refills | Status: AC | PRN

## 2024-10-16 MED ORDER — METRONIDAZOLE 50 MG/ML ORAL SUSPENSION: 500.0000 mg | Freq: Two times a day (BID) | ORAL | Status: AC

## 2024-10-16 NOTE — Telephone Encounter (Signed)
 Patient left voicemail stating that he needs Dr. Anice to call the pharmacy and authorize that they fill his pain medication. Please advise. Patient has left 5 voice mails since 1pm today.

## 2024-10-16 NOTE — Telephone Encounter (Signed)
 Patient called and stated that the pharmacy would not fill the Rx for pain medication until we called to authorize it.  Please call and authorize the Rx and call the patient at [phone].

## 2024-10-16 NOTE — Progress Notes (Signed)
 Discussed the use of AI scribe software for clinical note transcription with the patient, who gave verbal consent to proceed.  History of Present Illness Ethan [name]. is a 53 year old male who presents with concerns about an open fracture and oral care following surgery.  Postoperative oral pain and swelling - Significant pain, especially at night, disrupts sleep - Swelling improved with icing three times daily - Difficulty tolerating liquid Tylenol  due to gastrointestinal upset; prefers crushed pills for easier ingestion - Completed a course of antibiotics recently  Oral hygiene and wound care - Uses wax to protect gums - Rinses mouth with peroxide and water  solution - Avoids alcohol-based mouthwashes - Occasional oral bleeding managed with hospital-provided suction device  Nutritional intake - Follows a nutrition plan consisting of Ensure, cream of chicken, and chicken broth  Constipation - Uses a powder mixed with water  to alleviate constipation, likely secondary to pain medication  Glycemic control - Monitors blood glucose levels; most recent reading 140 mg/dL - Uses insulin  and oral hypoglycemic agents - Uses pill crusher to facilitate medication ingestion - No alcohol consumption while on current medications  Tobacco use - History of smoking half a pack of cigarettes daily - Currently attempting cessation; now smokes only a few cigarettes daily  Recent finger injury - Sustained finger injury while lifting a motor - Treated by orthopedic specialist - Follow-up appointment scheduled in twelve days  Physical Exam HEENT:  MMF wires in proper position Location of left Open fracture with dehiscence and possible plate exposed.   Assessment & Plan Delayed healing of left mandibular body fracture and comminuted right mandibular fracture, post-ORIF Delayed healing with infection risk at open fracture site. Swelling decreasing, nighttime pain persists. Continued  antibiotics necessary, potential further surgery if infection occurs. - Continue antibiotics as prescribed. - Ice affected area three times daily. - Avoid ibuprofen. - Use 50/50 peroxide-water  oral rinse. - Avoid alcohol-based mouthwashes. - Plan for wire removal and transition to rubber bands after Thanksgiving.  Postoperative pain related to mandibular fractures Persistent nighttime pain likely from wires. Pain management complicated by gastrointestinal upset from liquid Tylenol . - Continue anti-nausea medication as needed. - Avoid ibuprofen. - Consider alternative pain management if current regimen ineffective.  Constipation due to opioid analgesics Constipation likely from opioid use for pain management. - Continue current bowel regimen with powdery medication in water .  Tobacco use disorder Current smoking of half a pack per day. Smoking cessation critical for healing. - Provided nicotine  patches for smoking cessation. Given the patient's tobacco use, I also discussed cessation and options for cessation, including counseling. Counseled patient on the dangers of tobacco use, advised patient to stop smoking, and reviewed strategies to maximize success. We reviewed treatment options to assist him quit smoking including NRT, Chantix, and Bupropion. Patient is ready to quit, and requested patches. Total time spent with this was 5 minutes.   CPT 99406 (4-10 mins)   Meds ordered this encounter  Medications   nicotine  (NICODERM CQ  - DOSED IN MG/24 HR) 7 mg/24hr patch    Sig: RX #2 Weeks 7-8: 7 mg x 1 patch dailyWear for 24 hours. If you have sleep disturbances, remove at bedtime..    Dispense:  14 patch    Refill:  0   oxyCODONE  (ROXICODONE ) 5 MG/5ML solution    Sig: Take 10 mLs (10 mg total) by mouth every 6 (six) hours as needed for up to 5 days for moderate pain (pain score 4-6) or severe pain (pain  score 7-10).    Dispense:  205 mL    Refill:  0    ok for and up to 5 days  per MD/maf   cefdinir (OMNICEF) 250 MG/5ML suspension    Sig: Take 6 mLs (300 mg total) by mouth 2 (two) times daily for 7 days.    Dispense:  84 mL    Refill:  0   metroNIDAZOLE  (FLAGYL ) 50 mg/ml oral suspension 500 mg     Follow up in two weeks

## 2024-10-16 NOTE — Telephone Encounter (Unsigned)
 I spoke with pt after I spoke with his pharmacy. I inform pt that the Pharmacy stated that they cannot refill his pain medication today. It will have to be filled on the 10/18/24 due to his insurance and pt cannot pay OTP for the medication. I informed him that he can alternate Liquid Per Dr. Anice, if pt's pain is to much for him, he needs to go to ED for pain management.

## 2024-10-16 NOTE — Telephone Encounter (Signed)
 I had to LVM at pt's pharmacy. Pt called and stated that they will not fill Rx. I told to find out what the problem is, I had to LVM for pharmacy to call me back.

## 2024-10-16 NOTE — Telephone Encounter (Signed)
 Patient was provided pain medication that was to last until Thursday and then can fill my prescription of pain medication. If this pain medication is not providing relief. He should go to the emergency room.

## 2024-10-16 NOTE — Telephone Encounter (Signed)
 TC from pt to cancel due to family member not able to bring him to his appt. Advised him to contact his [facility] to see if he can get transporation added to his services. [facility] would provide transportation for him if he has the benefit through his [facility]. He verbalized understanding and will contact them to find out. R/S his appointment today.

## 2024-10-16 NOTE — Telephone Encounter (Signed)
 Done

## 2024-10-21 NOTE — Progress Notes (Signed)
 Gastroenterology Progress Note    Primary Care Physician:  [facility] Primary Gastroenterologist:  Dr. Shaaron  Pre-Procedure History & Physical: HPI:  Ethan Cerda. is a 53 y.o. 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  Unfortunately recently had a complicated jaw fracture and had to have surgery.  He is now back to baseline uses MiraLAX  on occasion.  He takes opioids since his jaw fracture.  Reflux well-controlled on omeprazole 20 mg daily.  It is good to know that he had a high-quality colonoscopy back in January of this year revealing only diverticulosis.  From a GI standpoint, he is doing very well at this time.  Past Medical History:  Diagnosis Date   Asthma    Cellulitis    hospitalized 07/2013   Diabetes mellitus without complication (HCC)    Diverticulitis    06/2012   Diverticulitis    GERD (gastroesophageal reflux disease)    Gout    History of cellulitis    Hypercholesterolemia    Hypertension    Hypokalemia    Microscopic hematuria    Pneumonia    Tobacco use    Urethral stricture     Past Surgical History:  Procedure Laterality Date   ARM WOUND REPAIR / CLOSURE     COLONOSCOPY WITH PROPOFOL  N/A 12/12/2023   Procedure: COLONOSCOPY WITH PROPOFOL ;  Surgeon: Shaaron Lamar HERO, MD;  [facility];  Service: Endoscopy;  Laterality: N/A;  7:30 am, asa 2   CYSTOSCOPY WITH URETHRAL DILATATION N/A 04/05/2024   Procedure: CYSTOSCOPY, WITH URETHRAL DILATION;  Surgeon: Penne Knee, MD;  Location: [facility] ORS;  Service: Urology;  Laterality: N/A;  BALLOON DILATION USING OPTILUME   ESOPHAGOGASTRODUODENOSCOPY N/A 11/14/2013   MFM:Ypjujo hernia; otherwise negative examination   ORIF MANDIBULAR FRACTURE Bilateral 10/09/2024   Procedure: OPEN REDUCTION INTERNAL FIXATION (ORIF) MANDIBULAR FRACTURE;  Surgeon:  Anice Penne, DO;  Location: [facility];  Service: ENT;  Laterality: Bilateral;   WRIST ARTHROCENTESIS     WRIST SURGERY  12/2013   right    Prior to Admission medications   Medication Sig Start Date End Date Taking? Authorizing Provider  allopurinol  (ZYLOPRIM ) 300 MG tablet Take 300 mg by mouth every morning. 01/24/24  Yes [provider]  amLODipine  (NORVASC ) 10 MG tablet Take 10 mg by mouth every morning.   Yes [provider]  Blood Glucose Monitoring Suppl DEVI 1 each by Does not apply route 3 (three) times daily. May dispense any manufacturer covered by patient's insurance. 08/15/24  Yes Shahmehdi, Adriana LABOR, MD  feeding supplement (ENSURE PLUS HIGH PROTEIN) LIQD Take 237 mLs by mouth 3 (three) times daily between meals. 10/13/24 11/12/24 Yes Briana Elgin LABOR, MD  Glucose Blood (BLOOD GLUCOSE TEST STRIPS) STRP 1 each by Does not apply route 3 (three) times daily. Use as directed to check blood sugar. May dispense any manufacturer covered by patient's insurance and fits patient's device. 08/15/24  Yes Shahmehdi, Adriana LABOR, MD  Insulin  Pen Needle (PEN NEEDLES) 31G X 5 MM MISC 1 each by Does not apply route 3 (three) times daily. May dispense any manufacturer covered by patient's insurance. 08/15/24  Yes Shahmehdi, Seyed A, MD  JARDIANCE 10 MG TABS tablet Take 10 mg by mouth daily. 09/19/24  Yes [provider]  Lancet Device MISC 1 each by Does not apply route 3 (three) times daily. May dispense  any manufacturer covered by [employer]. 08/15/24  Yes Shahmehdi, Seyed A, MD  Lancets MISC 1 each by Does not apply route 3 (three) times daily. Use as directed to check blood sugar. May dispense any manufacturer covered by patient's insurance and fits patient's device. 08/15/24  Yes Shahmehdi, Seyed A, MD  lisinopril  (ZESTRIL ) 20 MG tablet Take 20 mg by mouth every morning.   Yes [provider]  omeprazole (PRILOSEC) 40 MG capsule Take 40 mg by mouth every morning.   Yes  [provider]  polyethylene glycol powder (GLYCOLAX /MIRALAX ) 17 GM/SCOOP powder Take 17 g by mouth daily as needed for mild constipation. Dissolve 1 capful (17gm)  in 4-8 ounces of liquid  and take by mouth daily as needed for mild constipation 10/13/24  Yes Briana Elgin LABOR, MD  pravastatin  (PRAVACHOL ) 40 MG tablet Take 40 mg by mouth every morning.   Yes [provider]  cefdinir  (OMNICEF ) 250 MG/5ML suspension Take 6 mLs (300 mg total) by mouth 2 (two) times daily for 7 days. 10/16/24 10/23/24  Anice Riis, DO  nicotine  (NICODERM CQ  - DOSED IN MG/24 HR) 7 mg/24hr patch RX #2 Weeks 7-8: 7 mg x 1 patch dailyWear for 24 hours. If you have sleep disturbances, remove at bedtime.. 10/16/24   Anice Riis, DO  oxyCODONE  (ROXICODONE ) 5 MG/5ML solution Take 10 mLs (10 mg total) by mouth every 6 (six) hours as needed for up to 5 days for moderate pain (pain score 4-6) or severe pain (pain score 7-10). 10/16/24 10/21/24  Anice Riis, DO    Allergies as of 10/15/2024 - Review Complete 10/15/2024  Allergen Reaction Noted   Penicillins Rash 10/20/2023    Family History  Problem Relation Age of Onset   Colon cancer Neg Hx     Social History   Socioeconomic History   Marital status: Single    Spouse name: Not on file   Number of children: 3   Years of education: Not on file   Highest education level: Not on file  Occupational History   Not on file  Tobacco Use   Smoking status: Every Day    Current packs/day: 0.50    Average packs/day: 0.5 packs/day for 35.9 years (17.9 ttl pk-yrs)    Types: Cigarettes    Start date: 68   Smokeless tobacco: Never   Tobacco comments:    1/2 pack daily  Vaping Use   Vaping status: Never Used  Substance and Sexual Activity   Alcohol use: Yes    Alcohol/week: 1.0 standard drink of alcohol    Types: 1 Cans of beer per week    Comment: heavy use on weekends   Drug use: Never   Sexual activity: Not Currently    Birth  control/protection: None  Other Topics Concern   Not on file  Social History Narrative   ** Merged History Encounter **       Social Drivers of Health   Financial Resource Strain: Low Risk (06/19/2024)   Received from [facility]   Overall Financial Resource Strain (CARDIA)    How hard is it for you to pay for the very basics like food, housing, medical care, and heating?: Not hard at all  Food Insecurity: No Food Insecurity (10/10/2024)   Hunger Vital Sign    Worried About Running Out of Food in the Last Year: Never true    Ran Out of Food in the Last Year: Never true  Transportation Needs: No Transportation Needs (10/10/2024)  PRAPARE - Administrator, Civil Service (Medical): No    Lack of Transportation (Non-Medical): No  Physical Activity: Insufficiently Active (06/19/2024)   Received from [facility]   Exercise Vital Sign    On average, how many days per week do you engage in moderate to strenuous exercise (like a brisk walk)?: 2 days    On average, how many minutes do you engage in exercise at this level?: 10 min  Stress: No Stress Concern Present (06/19/2024)   Received from [facility] of Occupational Health - Occupational Stress Questionnaire    Do you feel stress - tense, restless, nervous, or anxious, or unable to sleep at night because your mind is troubled all the time - these days?: Only a little  Social Connections: Moderately Integrated (06/19/2024)   Received from [facility]   Social Connection and Isolation Panel    In a typical week, how many times do you talk on the phone with family, friends, or neighbors?: More than three times a week    How often do you get together with friends or relatives?: More than three times a week    How often do you attend church or religious services?: More than 4 times per year    Do you belong to any clubs or organizations such as church groups, unions, fraternal or athletic groups,  or school groups?: Yes    How often do you attend meetings of the clubs or organizations you belong to?: More than 4 times per year    Marital Status: Divorced  Intimate Partner Violence: Not At Risk (10/10/2024)   Humiliation, Afraid, Rape, and Kick questionnaire    Fear of Current or Ex-Partner: No    Emotionally Abused: No    Physically Abused: No    Sexually Abused: No    Review of Systems   See HPI, otherwise negative ROS  Physical Exam: BP 109/75   Pulse 90   Temp 98.1 F (36.7 C) (Temporal)   Ht 6' 3 (1.905 m)   Wt 279 lb 6.4 oz (126.7 kg)   SpO2 94%   BMI 34.92 kg/m  General:   Alert, pleasant cooperative no acute distress.  Cannot speak clearly as he could not open his jaw much.  Skin:  Intact without significant lesions or rashes. Abdomen: Non-distended, normal bowel sounds.  Soft and nontender without appreciable mass or hepatosplenomegaly.    Impression/Plan:   53 year old gentleman admitted to the hospital back in September with abdominal pain.  Clinical scenario including CT findings consistent with uncomplicated sigmoid diverticulitis versus diverticular associated colitis.SABRA  He recovered with antibiotics.  He has no significant GI symptoms at this time.  GERD well-controlled on omeprazole.  Occasional constipation managed with MiraLAX .  It is reassuring to note he is had a colonoscopy in January revealing only diverticulosis.  10-year screening scheduled.  Recommendations:  Since you had a recent colonoscopy you do not need another 1 for 10 years  I do recommend Benefiber 1 to 2 tablespoons every day in 8 ounces of water   Daily Benefiber.  Omeprazole daily.  Unless something comes up, plan to see you back in 1 year.     Notice: This dictation was prepared with Dragon dictation along with smaller phrase technology. Any transcriptional errors that result from this process are unintentional and may not be corrected upon review.

## 2024-10-29 ENCOUNTER — Encounter (INDEPENDENT_AMBULATORY_CARE_PROVIDER_SITE_OTHER): Payer: Self-pay

## 2024-10-29 ENCOUNTER — Ambulatory Visit (INDEPENDENT_AMBULATORY_CARE_PROVIDER_SITE_OTHER): Payer: MEDICAID

## 2024-10-29 VITALS — BP 137/79 | HR 84

## 2024-10-29 DIAGNOSIS — E11630 Type 2 diabetes mellitus with periodontal disease: Secondary | ICD-10-CM

## 2024-10-29 DIAGNOSIS — M8448XP Pathological fracture, other site, subsequent encounter for fracture with malunion: Secondary | ICD-10-CM

## 2024-10-29 DIAGNOSIS — S02602G Fracture of unspecified part of body of left mandible, subsequent encounter for fracture with delayed healing: Secondary | ICD-10-CM

## 2024-10-29 DIAGNOSIS — Z720 Tobacco use: Secondary | ICD-10-CM

## 2024-10-29 DIAGNOSIS — Z09 Encounter for follow-up examination after completed treatment for conditions other than malignant neoplasm: Secondary | ICD-10-CM

## 2024-10-29 DIAGNOSIS — S02609A Fracture of mandible, unspecified, initial encounter for closed fracture: Secondary | ICD-10-CM

## 2024-10-29 DIAGNOSIS — T84498A Other mechanical complication of other internal orthopedic devices, implants and grafts, initial encounter: Secondary | ICD-10-CM

## 2024-10-29 MED ORDER — CLINDAMYCIN PALMITATE HCL 75 MG/5ML PO SOLR: 300.0000 mg | Freq: Three times a day (TID) | ORAL | 0 refills | Status: AC

## 2024-10-29 MED ORDER — CHLORHEXIDINE GLUCONATE 0.12 % MT SOLN: 15.0000 mL | Freq: Two times a day (BID) | OROMUCOSAL | 0 refills | Status: AC

## 2024-10-29 NOTE — Progress Notes (Signed)
 Dear Dr. Bernardino, Here is my assessment for our mutual patient, Ethan [name]. Thank you for allowing me the opportunity to care for your patient. Please do not hesitate to contact me should you have any other questions. Sincerely, Dr. Penne [name]  Otolaryngology Clinic Note Referring provider: Dr. Bernardino HPI:  Discussed the use of AI scribe software for clinical note transcription with the patient, who gave verbal consent to proceed.  History of Present Illness Ethan [name]. is a 53 year old male who presents with concerns about healing and numbness post-jaw surgery.  Postoperative jaw discomfort and sensory changes - Status post jaw surgery approximately three weeks ago - Persistent numbness on the right chin, particularly around the lip and nose - Ongoing discomfort from wires and screws - Expresses concern regarding the plate in the jaw  Oropharyngeal symptoms - Gagging and hiccups, attributed to wires and cold weather - Mouth wired shut, resulting in difficulty eating and requiring blended meals - Frustration with dietary restrictions, including unsuccessful attempt to blend ham with mayonnaise  Pain management - minimal pain. Tolerated. No using OTC.   Oral hygiene and infection prevention - Gargles with a peroxide and water  solution twice daily for oral hygiene and infection prevention - Completed a course of antibiotics yesterday  Glycemic control - Diabetes managed with medication  PMH/Meds/All/SocHx/FamHx/ROS:   Past Medical History:  Diagnosis Date   Asthma    Cellulitis    hospitalized 07/2013   Diabetes mellitus without complication (HCC)    Diverticulitis    06/2012   Diverticulitis    GERD (gastroesophageal reflux disease)    Gout    History of cellulitis    Hypercholesterolemia    Hypertension    Hypokalemia    Microscopic hematuria    Pneumonia    Tobacco use    Urethral stricture      Past Surgical History:  Procedure Laterality Date    ARM WOUND REPAIR / CLOSURE     COLONOSCOPY WITH PROPOFOL  N/A 12/12/2023   Procedure: COLONOSCOPY WITH PROPOFOL ;  Surgeon: Shaaron Lamar HERO, MD;  [facility];  Service: Endoscopy;  Laterality: N/A;  7:30 am, asa 2   CYSTOSCOPY WITH URETHRAL DILATATION N/A 04/05/2024   Procedure: CYSTOSCOPY, WITH URETHRAL DILATION;  Surgeon: Ethan Knee, MD;  Location: [facility] ORS;  Service: Urology;  Laterality: N/A;  BALLOON DILATION USING OPTILUME   ESOPHAGOGASTRODUODENOSCOPY N/A 11/14/2013   MFM:Ypjujo hernia; otherwise negative examination   ORIF MANDIBULAR FRACTURE Bilateral 10/09/2024   Procedure: OPEN REDUCTION INTERNAL FIXATION (ORIF) MANDIBULAR FRACTURE;  Surgeon: [name] Penne, DO;  Location: [facility] OR;  Service: ENT;  Laterality: Bilateral;   WRIST ARTHROCENTESIS     WRIST SURGERY  12/2013   right    Family History  Problem Relation Age of Onset   Colon cancer Neg Hx      Social Connections: Moderately Integrated (06/19/2024)   Received from [facility]   Social Connection and Isolation Panel    In a typical week, how many times do you talk on the phone with family, friends, or neighbors?: More than three times a week    How often do you get together with friends or relatives?: More than three times a week    How often do you attend church or religious services?: More than 4 times per year    Do you belong to any clubs or organizations such as church groups, unions, fraternal or athletic groups, or school groups?: Yes    How  often do you attend meetings of the clubs or organizations you belong to?: More than 4 times per year    Marital Status: Divorced      Current Outpatient Medications:    allopurinol  (ZYLOPRIM ) 300 MG tablet, Take 300 mg by mouth every morning., Disp: , Rfl:    amLODipine  (NORVASC ) 10 MG tablet, Take 10 mg by mouth every morning., Disp: , Rfl:    Blood Glucose Monitoring Suppl DEVI, 1 each by Does not apply route 3 (three) times daily. May dispense any  manufacturer covered by patient's insurance., Disp: 1 each, Rfl: 0   chlorhexidine  (PERIDEX ) 0.12 % solution, Use as directed 15 mLs in the mouth or throat 2 (two) times daily for 7 days., Disp: 210 mL, Rfl: 0   clindamycin  (CLEOCIN ) 75 MG/5ML solution, Take 20 mLs (300 mg total) by mouth 3 (three) times daily for 7 days., Disp: 420 mL, Rfl: 0   feeding supplement (ENSURE PLUS HIGH PROTEIN) LIQD, Take 237 mLs by mouth 3 (three) times daily between meals., Disp: 21330 mL, Rfl: 0   Glucose Blood (BLOOD GLUCOSE TEST STRIPS) STRP, 1 each by Does not apply route 3 (three) times daily. Use as directed to check blood sugar. May dispense any manufacturer covered by patient's insurance and fits patient's device., Disp: 100 strip, Rfl: 0   Insulin  Pen Needle (PEN NEEDLES) 31G X 5 MM MISC, 1 each by Does not apply route 3 (three) times daily. May dispense any manufacturer covered by patient's insurance., Disp: 100 each, Rfl: 0   JARDIANCE 10 MG TABS tablet, Take 10 mg by mouth daily., Disp: , Rfl:    Lancet Device MISC, 1 each by Does not apply route 3 (three) times daily. May dispense any manufacturer covered by patient's insurance., Disp: 1 each, Rfl: 0   Lancets MISC, 1 each by Does not apply route 3 (three) times daily. Use as directed to check blood sugar. May dispense any manufacturer covered by patient's insurance and fits patient's device., Disp: 100 each, Rfl: 0   lisinopril  (ZESTRIL ) 20 MG tablet, Take 20 mg by mouth every morning., Disp: , Rfl:    nicotine  (NICODERM CQ  - DOSED IN MG/24 HR) 7 mg/24hr patch, RX #2 Weeks 7-8: 7 mg x 1 patch dailyWear for 24 hours. If you have sleep disturbances, remove at bedtime.., Disp: 14 patch, Rfl: 0   omeprazole (PRILOSEC) 40 MG capsule, Take 40 mg by mouth every morning., Disp: , Rfl:    polyethylene glycol powder (GLYCOLAX /MIRALAX ) 17 GM/SCOOP powder, Take 17 g by mouth daily as needed for mild constipation. Dissolve 1 capful (17gm)  in 4-8 ounces of liquid  and  take by mouth daily as needed for mild constipation, Disp: 238 g, Rfl: 0   pravastatin  (PRAVACHOL ) 40 MG tablet, Take 40 mg by mouth every morning., Disp: , Rfl:    Physical Exam:   BP 137/79   Pulse 84   SpO2 91%   The patient was awake, alert, and appropriate. The external ears were inspected, and otoscopy was performed to evaluate the external auditory canals and tympanic membranes. The nasal cavity and septum were examined for mucosal changes, obstruction, or discharge. The oral cavity and oropharynx were inspected for mucosal lesions, infection, or tonsillar hypertrophy. The neck was palpated for lymphadenopathy, thyroid abnormalities, or other masses. Cranial nerve function was grossly intact.  Pertinent Findings: Physical Exam HEENT: Atraumatic, normocephalic. Gum recession along fracture line on the left. Bone exposed. Amount of bone exposed and gum recession  is decreased.  Superior aspect of plate visible. Nod drainage. No tenderness to palpation. Right V3 numbness.    Impression & Plans:  Ottavio Norem is a 53 y.o. male  1. Open fracture of left side of mandibular body with delayed healing, subsequent encounter    - Findings and diagnoses discussed in detail with the patient. - Risks, benefits, and alternatives were reviewed. Through shared decision making, the patient elects to proceed with below. Assessment & Plan Delayed healing of left mandibular body fracture, post-ORIF Delayed healing with gum and fracture not progressing as expected. Protruding plate and diabetes may contribute to delay. - Schedule plate and wire removal in six weeks from surgery. - Keep mouth wired for six weeks to prevent re-fracture. - Avoid chewing to prevent re-fracture. - Have wire cutters available in case of vomiting. - Continue oral hygiene with peroxide and water  gargles twice daily. - Monitor for infection and contact office if symptoms develop.  Decrease tobacco use. Continue strict  glucose control.   Comminuted right mandibular fracture, post-ORIF  Postoperative pain related to mandibular fractures Pain present, especially at night. Ibuprofen not recommended due to potential impact on healing. - Avoid ibuprofen unless necessary. - Use ice for pain management.  Numbness of lower lip due to mandibular fracture Numbness persists due to nerve involvement.  - discussed observation vs plate removal.  Plan to remove exposed hardware on the left in the operating room with mucosal advancement flap. Will replace wires for 3 weeks and plan for a total of 6 weeks of MMF.   - Orders placed:  Orders Placed This Encounter  Procedures   Ambulatory Referral For Surgery Scheduling   - Medications prescribed/continued/adjusted:  Meds ordered this encounter  Medications   clindamycin  (CLEOCIN ) 75 MG/5ML solution    Sig: Take 20 mLs (300 mg total) by mouth 3 (three) times daily for 7 days.    Dispense:  420 mL    Refill:  0   chlorhexidine  (PERIDEX ) 0.12 % solution    Sig: Use as directed 15 mLs in the mouth or throat 2 (two) times daily for 7 days.    Dispense:  210 mL    Refill:  0     Thank you for allowing me the opportunity to care for your patient. Please do not hesitate to contact me should you have any other questions.  Sincerely, Ethan Croak, DO Otolaryngologist (ENT) [facility] Phone: [phone] Fax: [phone]  10/29/2024, 10:34 PM

## 2024-10-31 ENCOUNTER — Telehealth (INDEPENDENT_AMBULATORY_CARE_PROVIDER_SITE_OTHER): Payer: Self-pay

## 2024-10-31 NOTE — Telephone Encounter (Signed)
 Further reviewed literature and senior mentors. Currently no evidence of infection and gingiva is improving. Plan to switch to peridex  and clindamycin . Will get imaging to assess bone prior to surgical intervention. Emphasized NO SMOKING -pt agreed w/plan   Also verified preferred pharmacy. Pt is smoking tobacco currently.

## 2024-11-02 ENCOUNTER — Ambulatory Visit: Payer: MEDICAID | Admitting: Orthopedic Surgery

## 2024-11-02 ENCOUNTER — Encounter (HOSPITAL_BASED_OUTPATIENT_CLINIC_OR_DEPARTMENT_OTHER): Payer: Self-pay

## 2024-11-02 ENCOUNTER — Encounter: Payer: Self-pay | Admitting: Orthopedic Surgery

## 2024-11-02 ENCOUNTER — Other Ambulatory Visit: Payer: MEDICAID

## 2024-11-02 ENCOUNTER — Ambulatory Visit (HOSPITAL_BASED_OUTPATIENT_CLINIC_OR_DEPARTMENT_OTHER): Admit: 2024-11-02 | Payer: MEDICAID

## 2024-11-02 DIAGNOSIS — S62620D Displaced fracture of medial phalanx of right index finger, subsequent encounter for fracture with routine healing: Secondary | ICD-10-CM

## 2024-11-02 SURGERY — REMOVAL, HARDWARE, MANDIBLE
Anesthesia: Choice

## 2024-11-02 NOTE — Progress Notes (Signed)
 Return patient Visit  Assessment: Ethan [name]. is a 53 y.o. male with the following: 1. Closed displaced fracture of middle phalanx of right index finger, subsequent encounter  Plan: Ethan [name]. sustained injury to the right index finger.  There continues to be consolidation and remodeling of the middle phalanx to the index finger.  He has full range of motion.  He has good grip strength.  He does note persistent swelling and mild deformity, which may continue to improve.  He states understanding.  He is pleased with the improvements.  He is able to do over the he needs to at work.  He will follow-up as needed.   Follow-up: Return if symptoms worsen or fail to improve.  Subjective:  Chief Complaint  Patient presents with   Fracture    R index finger DOI 08/21/24    History of Present Illness: Ethan [name]. is a 53 y.o. male who returns for evaluation of right index finger pain.  He was working in a garage, when a chain caught his finger and twisted the finger.  Injury was sustained about 10 weeks ago.  Has pain is much better.  He is no longer taking anything for pain.  He does continue to have some swelling.  He is happy with his motion.  He is able to do everything he needs to do at work.   Review of Systems: No fevers or chills No numbness or tingling No chest pain No shortness of breath No bowel or bladder dysfunction No GI distress No headaches    Objective: There were no vitals taken for this visit.  Physical Exam:  General: Alert and oriented. and No acute distress. Gait: Normal gait.  Index finger without bruising.  There is residual swelling.  Soft tissue laceration has healed.  Stiffness is noted the PIP and the DIP joint, but this is improving.  No tenderness to the middle phalanx.  He is able to make a full fist.  Slight extensor lag, primarily due to the swelling between the PIP and DIP joint.  Good grip strength.  Fingers warm and  well-perfused.   IMAGING: I personally ordered and reviewed the following images  X-ray the right index finger obtained in clinic today.  These are compared to prior x-rays.  Comminuted fracture of the middle phalanx remains in stable alignment.  There has been interval consolidation.  Additional bone formation and remodeling is apparent.  No obvious intra-articular involvement.  No additional injuries.  No bony lesions.  Impression: Healed right index finger comminuted middle phalanx fracture  New Medications:  No orders of the defined types were placed in this encounter.     Ethan DELENA Horde, MD  11/02/2024 9:54 AM

## 2024-11-05 ENCOUNTER — Telehealth (INDEPENDENT_AMBULATORY_CARE_PROVIDER_SITE_OTHER): Payer: Self-pay

## 2024-11-05 NOTE — Telephone Encounter (Signed)
 Called patient to let them know we needed X-rays before we can talk about getting wires removed. Patient understood I gave them the phone number to call and schedule the x-ray. I also asked patient if he was able to pick up his medications Dr. Anice sent out for him, patient stated he pick up the prescriptions last week. I let patient know to call us  when he has done his x-ray to give us  a call and we would get patient scheduled for a follow up. Patient understood.

## 2024-11-05 NOTE — Telephone Encounter (Signed)
 Patient called asking when he can get his wires removed.

## 2024-11-06 ENCOUNTER — Emergency Department (HOSPITAL_COMMUNITY)
Admission: EM | Admit: 2024-11-06 | Discharge: 2024-11-06 | Discharge status: Against Medical Advice | Payer: MEDICAID | Attending: Emergency Medicine | Admitting: Emergency Medicine

## 2024-11-06 ENCOUNTER — Other Ambulatory Visit: Payer: Self-pay

## 2024-11-06 ENCOUNTER — Encounter (HOSPITAL_COMMUNITY): Payer: Self-pay | Admitting: Emergency Medicine

## 2024-11-06 ENCOUNTER — Emergency Department (HOSPITAL_COMMUNITY): Payer: MEDICAID

## 2024-11-06 NOTE — ED Triage Notes (Signed)
 Pt sent here by ENT for xray r/t mouth being wired shut.  Needs to ensure proper healing to remove wires.

## 2024-11-06 NOTE — ED Notes (Signed)
 Pt seen walking out of lobby by [employer] after his xray

## 2024-11-06 NOTE — ED Provider Triage Note (Signed)
 Emergency Medicine Provider Triage Evaluation Note  Ethan LELON Durenda Mickey. , a 53 y.o. male  was evaluated in triage.  Pt complains of request for dental xr  Pt sent by ENT for dental xr/panorex  No other complaints.  Review of Systems  Positive: Negative ros Negative: Fever, chills, dysphagia   Physical Exam  BP (!) 123/96 (BP Location: Right Arm)   Pulse 100   Temp 98 F (36.7 C) (Oral)   Resp 17   SpO2 95%  Gen:   Awake, no distress   Resp:  Normal effort  MSK:   Moves extremities without difficulty  Other:    Medical Decision Making  Medically screening exam initiated at 10:18 AM.  Appropriate orders placed.  Ethan LELON Durenda Mickey. was informed that the remainder of the evaluation will be completed by another provider, this initial triage assessment does not replace that evaluation, and the importance of remaining in the ED until their evaluation is complete.     Elnor Savant A, DO 11/06/24 1019

## 2024-11-07 ENCOUNTER — Encounter: Payer: MEDICAID | Admitting: Nutrition

## 2024-11-07 ENCOUNTER — Encounter: Payer: Self-pay | Admitting: Nutrition

## 2024-11-07 VITALS — Ht 75.0 in | Wt 267.0 lb

## 2024-11-07 DIAGNOSIS — E119 Type 2 diabetes mellitus without complications: Secondary | ICD-10-CM | POA: Diagnosis present

## 2024-11-07 DIAGNOSIS — E0800 Diabetes mellitus due to underlying condition with hyperosmolarity without nonketotic hyperglycemic-hyperosmolar coma (NKHHC): Secondary | ICD-10-CM

## 2024-11-07 DIAGNOSIS — Z713 Dietary counseling and surveillance: Secondary | ICD-10-CM | POA: Insufficient documentation

## 2024-11-07 NOTE — Progress Notes (Signed)
 Medical Nutrition Therapy  Appointment Start time:  1300  Appointment End time:  1355  Primary concerns today: New Onset Type 2 Dm  Referral diagnosis: E13.9 Preferred learning style: No Preference Learning readiness: Ready  NUTRITION ASSESSMENT  53 yr old bmale referred for newly diagnosed Type 2 DM. 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 He has since cut out drinking but occassionally. He cut out all his junk food, sweets, sodas and processed foods.  A1C had been 8.7% and now is reported to be 5.9% he reports. Has new appt with PCP at [facility] in the new few weeks.  He has lost about 40 lbs. He has been eating more baked chicken and fish, vegetables and drinking water  and some sf packets in water . History of Diverticulitis, so he avoids seeds and foods with skins or hulls. Jardiance 10 mg. He was on insulin  but no longer needs it since his blood sugars are much better. Testing blood sugars; FBS 120 this am. Range 107-110. Just off of insulin  for 3 weeks. Feels much better. Not as thirsty or urinating as much. Gets fuller now. Active on his job. Had a broken jaw and had to have his jaw wired. He eats foods more mashed or pureed  now or drinks Glucerna or equivalents for meals when he needs it.  He is motivated to continue to improve his health with eating healthier foods and getting off medications as much as he can.  Clinical Medical Hx:  Past Medical History:  Diagnosis Date   Asthma    Cellulitis    hospitalized 07/2013   Diabetes mellitus without complication (HCC)    Diverticulitis    06/2012   Diverticulitis    GERD (gastroesophageal reflux disease)    Gout    History of cellulitis    Hypercholesterolemia    Hypertension    Hypokalemia    Microscopic hematuria    Pneumonia    Tobacco use    Urethral stricture     Medications:  Current Outpatient Medications on File Prior to Visit   Medication Sig Dispense Refill   allopurinol  (ZYLOPRIM ) 300 MG tablet Take 300 mg by mouth every morning.     amLODipine  (NORVASC ) 10 MG tablet Take 10 mg by mouth every morning.     JARDIANCE 10 MG TABS tablet Take 10 mg by mouth daily.     lisinopril  (ZESTRIL ) 20 MG tablet Take 20 mg by mouth every morning.     nicotine  (NICODERM CQ  - DOSED IN MG/24 HR) 7 mg/24hr patch RX #2 Weeks 7-8: 7 mg x 1 patch dailyWear for 24 hours. If you have sleep disturbances, remove at bedtime.. 14 patch 0   omeprazole (PRILOSEC) 40 MG capsule Take 40 mg by mouth every morning.     polyethylene glycol powder (GLYCOLAX /MIRALAX ) 17 GM/SCOOP powder Take 17 g by mouth daily as needed for mild constipation. Dissolve 1 capful (17gm)  in 4-8 ounces of liquid  and take by mouth daily as needed for mild constipation 238 g 0   pravastatin  (PRAVACHOL ) 40 MG tablet Take 40 mg by mouth every morning.     Blood Glucose Monitoring Suppl DEVI 1 each by Does not apply route 3 (three) times daily. May dispense any manufacturer covered by patient's insurance. 1 each 0   feeding supplement (ENSURE PLUS HIGH PROTEIN) LIQD Take 237 mLs by mouth 3 (three) times daily between meals. 21330 mL 0  Glucose Blood (BLOOD GLUCOSE TEST STRIPS) STRP 1 each by Does not apply route 3 (three) times daily. Use as directed to check blood sugar. May dispense any manufacturer covered by patient's insurance and fits patient's device. 100 strip 0   Insulin  Pen Needle (PEN NEEDLES) 31G X 5 MM MISC 1 each by Does not apply route 3 (three) times daily. May dispense any manufacturer covered by patient's insurance. 100 each 0   Lancet Device MISC 1 each by Does not apply route 3 (three) times daily. May dispense any manufacturer covered by patient's insurance. 1 each 0   Lancets MISC 1 each by Does not apply route 3 (three) times daily. Use as directed to check blood sugar. May dispense any manufacturer covered by patient's insurance and fits patient's device. 100  each 0   No current facility-administered medications on file prior to visit.    Labs:  Lab Results  Component Value Date   HGBA1C 8.7 (H) 08/13/2024      Latest Ref Rng & Units 10/10/2024    3:46 AM 10/09/2024    2:58 AM 10/08/2024    8:21 PM  CMP  Glucose 70 - 99 mg/dL 834  895  89   BUN 6 - 20 mg/dL 11  16  19    Creatinine 0.61 - 1.24 mg/dL 8.90  9.01  9.02   Sodium 135 - 145 mmol/L 138  137  138   Potassium 3.5 - 5.1 mmol/L 4.6  4.6  4.3   Chloride 98 - 111 mmol/L 99  99  98   CO2 22 - 32 mmol/L 23  21  14    Calcium 8.9 - 10.3 mg/dL 9.6  9.6  89.9    Lipid Panel     Component Value Date/Time   CHOL  04/02/2010 0611    187        ATP III CLASSIFICATION:  <200     mg/dL   Desirable  799-760  mg/dL   Borderline High  >=759    mg/dL   High          TRIG 82 04/02/2010 0611   HDL 35 (L) 04/02/2010 0611   CHOLHDL 5.3 04/02/2010 0611   VLDL 16 04/02/2010 0611   LDLCALC (H) 04/02/2010 0611    136        Total Cholesterol/HDL:CHD Risk Coronary Heart Disease Risk Table                     Men   Women  1/2 Average Risk   3.4   3.3  Average Risk       5.0   4.4  2 X Average Risk   9.6   7.1  3 X Average Risk  23.4   11.0        Use the calculated Patient Ratio above and the CHD Risk Table to determine the patient's CHD Risk.        ATP III CLASSIFICATION (LDL):  <100     mg/dL   Optimal  899-870  mg/dL   Near or Above                    Optimal  130-159  mg/dL   Borderline  839-810  mg/dL   High  >809     mg/dL   Very High    Notable Signs/Symptoms: None  Lifestyle & Dietary Hx Lives by himself  Estimated daily fluid intake: a gallon of water  Supplements: Glucerna  PRN Sleep:  Stress / self-care:  Current average weekly physical activity: ADL  24-Hr Dietary Recall Usually eats 3 meals per day. Has to modify foods now with jaw wired and his teeth since broken jaw.  Estimated Energy Needs Calories: 2200 Carbohydrate: 248g Protein: 165g Fat:  61g   NUTRITION DIAGNOSIS  NB-1.1 Food and nutrition-related knowledge deficit As related to newly diagnosed Type 2 DM.  As evidenced by A1C 8.7%.   NUTRITION INTERVENTION  Nutrition education (E-1) on the following topics:  Nutrition and Diabetes education provided on My Plate, CHO counting, meal planning, portion sizes, timing of meals, avoiding snacks between meals unless having a low blood sugar, target ranges for A1C and blood sugars, signs/symptoms and treatment of hyper/hypoglycemia, monitoring blood sugars, taking medications as prescribed, benefits of exercising 30 minutes per day and prevention of complications of DM.  Lifestyle Medicine  - Whole Food, Plant Predominant Nutrition is highly recommended: Eat Plenty of vegetables, Mushrooms, fruits, Legumes, Whole Grains, Nuts, seeds in lieu of processed meats, processed snacks/pastries red meat, poultry, eggs.    -It is better to avoid simple carbohydrates including: Cakes, Sweet Desserts, Ice Cream, Soda (diet and regular), Sweet Tea, Candies, Chips, Cookies, Store Bought Juices, Alcohol in Excess of  1-2 drinks a day, Lemonade,  Artificial Sweeteners, Doughnuts, Coffee Creamers, Sugar-free Products, etc, etc.  This is not a complete list.....  Exercise: If you are able: 30 -60 minutes a day ,4 days a week, or 150 minutes a week.  The longer the better.  Combine stretch, strength, and aerobic activities.  If you were told in the past that you have high risk for cardiovascular diseases, you may seek evaluation by your heart doctor prior to initiating moderate to intense exercise programs.   Handouts Provided Include   Know your numbers S/S of Hyper/hypoglycemia and treatment Meal Plan CVD and DM  Learning Style & Readiness for Change Teaching method utilized: Visual & Auditory  Demonstrated degree of understanding via: Teach Back  Barriers to learning/adherence to lifestyle change: none  Goals Established by  Pt Goals  Increase Fruits, vegetables and dried beans, lentils, and whole grains-- eat foods from a garden Cut out processed meats- Cut out alcohol and sweets Rotate checking blood sugars in the morning and at night. Walk 30 minutes a day.   MONITORING & EVALUATION Dietary intake, weekly physical activity, and blood sugars in 3 months.  Next Steps  Patient is to work on eating 3 balanced meals based on whole plant based foods.Ethan [name]

## 2024-11-07 NOTE — Patient Instructions (Signed)
 Goals  Increase Fruits, vegetables and dried beans, lentils, and whole grains-- eat foods from a garden Cut out processed meats- Cut out alcohol and sweets Rotate checking blood sugars in the morning and at night. Walk 30 minutes a day.

## 2024-11-08 NOTE — Telephone Encounter (Signed)
 Patient called and left a voicemail about getting his wires off. Patient went to ER on 11/06/2024 and got the X-Ray done. Images are in his chart. Patient wants to know when he can come in to get his wires off.

## 2024-11-15 ENCOUNTER — Other Ambulatory Visit: Payer: Self-pay

## 2024-11-15 ENCOUNTER — Encounter (HOSPITAL_COMMUNITY): Payer: Self-pay

## 2024-11-15 NOTE — Progress Notes (Signed)
 SDW CALL  Patient was given pre-op instructions over the phone. The opportunity was given for the patient to ask questions. No further questions asked. Patient verbalized understanding of instructions given.   PCP - does not have a PCP at this time  Cardiologist - denies  PPM/ICD - denies Device Orders - n/a Rep Notified -  n/a  Chest x-ray - 05/19/24 EKG - 09/14/24 Stress Test - denies ECHO - denies Cardiac Cath - denies  Sleep Study - denies CPAP - na  Fasting Blood Sugar - 107 Checks Blood Sugar _1-2__ times a day  Patient instructed to check blood sugar morning of sugar and if blood sugar is less than 70, then patient to drink 1/2 cup of apple or cranberry juice and to recheck blood sugar in 15 minutes.   Last dose of GLP1 agonist-  n/a GLP1 instructions: n/a  Last dose of Jardiance was 12/17 - patient instructed not to take on 12/18 or 12/19  Blood Thinner Instructions: n/a Aspirin Instructions: n/a  ERAS Protcol - NPO PRE-SURGERY Ensure or G2- n/a  COVID TEST- n/a   Anesthesia review: no  Patient denies shortness of breath, fever, cough and chest pain over the phone call   All instructions explained to the patient, with a verbal understanding of the material. Patient agrees to go over the instructions while at home for a better understanding.

## 2024-11-16 ENCOUNTER — Ambulatory Visit (HOSPITAL_COMMUNITY): Admission: RE | Admit: 2024-11-16 | Discharge: 2024-11-16 | Disposition: A | Payer: MEDICAID

## 2024-11-16 ENCOUNTER — Ambulatory Visit (HOSPITAL_COMMUNITY): Payer: MEDICAID | Admitting: Anesthesiology

## 2024-11-16 ENCOUNTER — Encounter: Admission: RE | Payer: Self-pay

## 2024-11-16 ENCOUNTER — Ambulatory Visit (HOSPITAL_COMMUNITY): Payer: MEDICAID

## 2024-11-16 DIAGNOSIS — T84298A Other mechanical complication of internal fixation device of other bones, initial encounter: Secondary | ICD-10-CM | POA: Diagnosis not present

## 2024-11-16 DIAGNOSIS — I10 Essential (primary) hypertension: Secondary | ICD-10-CM | POA: Insufficient documentation

## 2024-11-16 DIAGNOSIS — S02602G Fracture of unspecified part of body of left mandible, subsequent encounter for fracture with delayed healing: Secondary | ICD-10-CM

## 2024-11-16 DIAGNOSIS — X58XXXA Exposure to other specified factors, initial encounter: Secondary | ICD-10-CM | POA: Diagnosis not present

## 2024-11-16 DIAGNOSIS — Z8719 Personal history of other diseases of the digestive system: Secondary | ICD-10-CM | POA: Insufficient documentation

## 2024-11-16 DIAGNOSIS — E119 Type 2 diabetes mellitus without complications: Secondary | ICD-10-CM | POA: Diagnosis not present

## 2024-11-16 DIAGNOSIS — M278 Other specified diseases of jaws: Secondary | ICD-10-CM | POA: Diagnosis not present

## 2024-11-16 DIAGNOSIS — F17200 Nicotine dependence, unspecified, uncomplicated: Secondary | ICD-10-CM | POA: Diagnosis not present

## 2024-11-16 DIAGNOSIS — K1329 Other disturbances of oral epithelium, including tongue: Secondary | ICD-10-CM | POA: Insufficient documentation

## 2024-11-16 DIAGNOSIS — X58XXXD Exposure to other specified factors, subsequent encounter: Secondary | ICD-10-CM | POA: Insufficient documentation

## 2024-11-16 DIAGNOSIS — K219 Gastro-esophageal reflux disease without esophagitis: Secondary | ICD-10-CM | POA: Insufficient documentation

## 2024-11-16 DIAGNOSIS — S02609G Fracture of mandible, unspecified, subsequent encounter for fracture with delayed healing: Secondary | ICD-10-CM

## 2024-11-16 DIAGNOSIS — Z794 Long term (current) use of insulin: Secondary | ICD-10-CM | POA: Insufficient documentation

## 2024-11-16 DIAGNOSIS — Z7984 Long term (current) use of oral hypoglycemic drugs: Secondary | ICD-10-CM | POA: Diagnosis not present

## 2024-11-16 HISTORY — PX: MANDIBULAR HARDWARE REMOVAL: SHX5205

## 2024-11-16 LAB — GLUCOSE, CAPILLARY
Glucose-Capillary: 130 mg/dL — ABNORMAL HIGH (ref 70–99)
Glucose-Capillary: 143 mg/dL — ABNORMAL HIGH (ref 70–99)
Glucose-Capillary: 134 mg/dL — ABNORMAL HIGH (ref 70–99)

## 2024-11-16 LAB — CBC
HCT: 43.7 % (ref 39.0–52.0)
Hemoglobin: 14.5 g/dL (ref 13.0–17.0)
MCH: 32.9 pg (ref 26.0–34.0)
MCHC: 33.2 g/dL (ref 30.0–36.0)
MCV: 99.1 fL (ref 80.0–100.0)
Platelets: 231 K/uL (ref 150–400)
RBC: 4.41 MIL/uL (ref 4.22–5.81)
RDW: 13.5 % (ref 11.5–15.5)
WBC: 5.9 K/uL (ref 4.0–10.5)
nRBC: 0 % (ref 0.0–0.2)

## 2024-11-16 LAB — BASIC METABOLIC PANEL WITH GFR
Anion gap: 13 (ref 5–15)
BUN: 13 mg/dL (ref 6–20)
CO2: 24 mmol/L (ref 22–32)
Calcium: 10.2 mg/dL (ref 8.9–10.3)
Chloride: 100 mmol/L (ref 98–111)
Creatinine, Ser: 0.85 mg/dL (ref 0.61–1.24)
GFR, Estimated: >60 mL/min
Glucose, Bld: 123 mg/dL — ABNORMAL HIGH (ref 70–99)
Potassium: 4.1 mmol/L (ref 3.5–5.1)
Sodium: 137 mmol/L (ref 135–145)

## 2024-11-16 SURGERY — REMOVAL, HARDWARE, MANDIBLE
Anesthesia: General

## 2024-11-16 MED ORDER — PROPOFOL 10 MG/ML IV BOLUS
INTRAVENOUS | Status: AC
  Filled 2024-11-16: qty 20

## 2024-11-16 MED ORDER — DEXMEDETOMIDINE HCL IN NACL 80 MCG/20ML IV SOLN
INTRAVENOUS | Status: DC | PRN
  Administered 2024-11-16 (×2): 8 ug via INTRAVENOUS
  Administered 2024-11-16: 4 ug via INTRAVENOUS

## 2024-11-16 MED ORDER — CHLORHEXIDINE GLUCONATE 0.12 % MT SOLN
15.0000 mL | Freq: Once | OROMUCOSAL | Status: AC
  Administered 2024-11-16: 15 mL via OROMUCOSAL
  Filled 2024-11-16: qty 15

## 2024-11-16 MED ORDER — ONDANSETRON HCL 4 MG/2ML IJ SOLN: 4.0000 mg | Freq: Once | INTRAMUSCULAR | Status: DC | PRN

## 2024-11-16 MED ORDER — AMISULPRIDE (ANTIEMETIC) 5 MG/2ML IV SOLN: 10.0000 mg | Freq: Once | INTRAVENOUS | Status: DC | PRN

## 2024-11-16 MED ORDER — INSULIN ASPART 100 UNIT/ML IJ SOLN: 0.0000 [IU] | INTRAMUSCULAR | Status: DC | PRN

## 2024-11-16 MED ORDER — ONDANSETRON HCL 4 MG/2ML IJ SOLN
INTRAMUSCULAR | Status: DC | PRN
  Administered 2024-11-16: 4 mg via INTRAVENOUS

## 2024-11-16 MED ORDER — OXYCODONE HCL 5 MG/5ML PO SOLN: 5.0000 mg | Freq: Once | ORAL | Status: AC | PRN

## 2024-11-16 MED ORDER — CLINDAMYCIN PHOSPHATE 900 MG/50ML IV SOLN
900.0000 mg | INTRAVENOUS | Status: AC
  Administered 2024-11-16: 900 mg via INTRAVENOUS
  Filled 2024-11-16: qty 50

## 2024-11-16 MED ORDER — PHENYLEPHRINE 80 MCG/ML (10ML) SYRINGE FOR IV PUSH (FOR BLOOD PRESSURE SUPPORT)
PREFILLED_SYRINGE | INTRAVENOUS | Status: DC | PRN
  Administered 2024-11-16: 80 ug via INTRAVENOUS
  Administered 2024-11-16: 40 ug via INTRAVENOUS
  Administered 2024-11-16 (×2): 80 ug via INTRAVENOUS

## 2024-11-16 MED ORDER — 0.9 % SODIUM CHLORIDE (POUR BTL) OPTIME
TOPICAL | Status: DC | PRN
  Administered 2024-11-16: 1000 mL

## 2024-11-16 MED ORDER — LIDOCAINE-EPINEPHRINE 1 %-1:100000 IJ SOLN
INTRAMUSCULAR | Status: DC | PRN
  Administered 2024-11-16: 10 mL

## 2024-11-16 MED ORDER — HYDROMORPHONE HCL 1 MG/ML IJ SOLN
INTRAMUSCULAR | Status: AC
  Filled 2024-11-16: qty 0.5

## 2024-11-16 MED ORDER — CLINDAMYCIN HCL 300 MG PO CAPS: 300.0000 mg | ORAL_CAPSULE | Freq: Three times a day (TID) | ORAL | 0 refills | Status: DC

## 2024-11-16 MED ORDER — FENTANYL CITRATE (PF) 250 MCG/5ML IJ SOLN
INTRAMUSCULAR | Status: DC | PRN
  Administered 2024-11-16 (×4): 50 ug via INTRAVENOUS

## 2024-11-16 MED ORDER — LIDOCAINE 2% (20 MG/ML) 5 ML SYRINGE
INTRAMUSCULAR | Status: DC | PRN
  Administered 2024-11-16: 100 mg via INTRAVENOUS

## 2024-11-16 MED ORDER — PROPOFOL 500 MG/50ML IV EMUL
INTRAVENOUS | Status: DC | PRN
  Administered 2024-11-16: 50 ug/kg/min via INTRAVENOUS

## 2024-11-16 MED ORDER — ORAL CARE MOUTH RINSE: 15.0000 mL | Freq: Once | OROMUCOSAL | Status: AC

## 2024-11-16 MED ORDER — OXYCODONE HCL 5 MG PO TABS
5.0000 mg | ORAL_TABLET | Freq: Once | ORAL | Status: AC | PRN
  Administered 2024-11-16: 5 mg via ORAL

## 2024-11-16 MED ORDER — LACTATED RINGERS IV SOLN: INTRAVENOUS | Status: DC | PRN

## 2024-11-16 MED ORDER — FENTANYL CITRATE (PF) 250 MCG/5ML IJ SOLN
INTRAMUSCULAR | Status: AC
  Filled 2024-11-16: qty 5

## 2024-11-16 MED ORDER — OXYCODONE HCL 5 MG PO TABS
ORAL_TABLET | ORAL | Status: AC
  Filled 2024-11-16: qty 1

## 2024-11-16 MED ORDER — DEXAMETHASONE SOD PHOSPHATE PF 10 MG/ML IJ SOLN
INTRAMUSCULAR | Status: DC | PRN
  Administered 2024-11-16: 5 mg via INTRAVENOUS

## 2024-11-16 MED ORDER — CHLORHEXIDINE GLUCONATE 0.12 % MT SOLN: 15.0000 mL | Freq: Three times a day (TID) | OROMUCOSAL | 2 refills | Status: DC

## 2024-11-16 MED ORDER — HYDROMORPHONE HCL 1 MG/ML IJ SOLN
INTRAMUSCULAR | Status: DC | PRN
  Administered 2024-11-16 (×2): .5 mg via INTRAVENOUS

## 2024-11-16 MED ORDER — PROPOFOL 10 MG/ML IV BOLUS
INTRAVENOUS | Status: DC | PRN
  Administered 2024-11-16: 100 mg via INTRAVENOUS
  Administered 2024-11-16 (×3): 50 mg via INTRAVENOUS

## 2024-11-16 MED ORDER — SUGAMMADEX SODIUM 200 MG/2ML IV SOLN
INTRAVENOUS | Status: AC
  Filled 2024-11-16: qty 4

## 2024-11-16 MED ORDER — FENTANYL CITRATE (PF) 100 MCG/2ML IJ SOLN: 25.0000 ug | INTRAMUSCULAR | Status: DC | PRN

## 2024-11-16 MED ORDER — VISTASEAL 10 ML SINGLE DOSE KIT
10.0000 mL | PACK | Freq: Once | CUTANEOUS | Status: AC
  Administered 2024-11-16: 10 mL via TOPICAL
  Filled 2024-11-16: qty 10

## 2024-11-16 MED ORDER — SUGAMMADEX SODIUM 200 MG/2ML IV SOLN
INTRAVENOUS | Status: DC | PRN
  Administered 2024-11-16: 400 mg via INTRAVENOUS

## 2024-11-16 MED ORDER — PHENYLEPHRINE HCL-NACL 20-0.9 MG/250ML-% IV SOLN
INTRAVENOUS | Status: DC | PRN
  Administered 2024-11-16: 40 ug/min via INTRAVENOUS

## 2024-11-16 MED ORDER — ACETAMINOPHEN 10 MG/ML IV SOLN: 1000.0000 mg | Freq: Once | INTRAVENOUS | Status: DC | PRN

## 2024-11-16 MED ORDER — MIDAZOLAM HCL (PF) 2 MG/2ML IJ SOLN
INTRAMUSCULAR | Status: DC | PRN
  Administered 2024-11-16: 2 mg via INTRAVENOUS

## 2024-11-16 MED ORDER — MIDAZOLAM HCL 2 MG/2ML IJ SOLN
INTRAMUSCULAR | Status: AC
  Filled 2024-11-16: qty 2

## 2024-11-16 MED ORDER — LACTATED RINGERS IV SOLN: INTRAVENOUS | Status: DC

## 2024-11-16 MED ORDER — OXYCODONE HCL 5 MG PO TABS: 5.0000 mg | ORAL_TABLET | Freq: Four times a day (QID) | ORAL | 0 refills | Status: AC | PRN

## 2024-11-16 MED ORDER — NICOTINE POLACRILEX 4 MG MT LOZG: 4.0000 mg | LOZENGE | OROMUCOSAL | 2 refills | Status: AC | PRN

## 2024-11-16 MED ORDER — ROCURONIUM BROMIDE 10 MG/ML (PF) SYRINGE
PREFILLED_SYRINGE | INTRAVENOUS | Status: DC | PRN
  Administered 2024-11-16: 40 mg via INTRAVENOUS
  Administered 2024-11-16: 60 mg via INTRAVENOUS

## 2024-11-16 MED ORDER — LIDOCAINE-EPINEPHRINE 1 %-1:100000 IJ SOLN
INTRAMUSCULAR | Status: AC
  Filled 2024-11-16: qty 1

## 2024-11-16 SURGICAL SUPPLY — 30 items
BLADE SURG 10 STRL SS (BLADE) ×1 IMPLANT
BUR RND DIAMOND ELITE 4.0 (BURR) IMPLANT
CANISTER SUCTION 3000ML PPV (SUCTIONS) ×1 IMPLANT
COVER SURGICAL LIGHT HANDLE (MISCELLANEOUS) ×1 IMPLANT
DERMABOND ADVANCED .7 DNX12 (GAUZE/BANDAGES/DRESSINGS) IMPLANT
DRAPE HALF SHEET 40X57 (DRAPES) ×1 IMPLANT
DRSG ADAPTIC 3X8 NADH LF (GAUZE/BANDAGES/DRESSINGS) IMPLANT
ELECT COATED BLADE 2.86 ST (ELECTRODE) IMPLANT
GLOVE BIOGEL PI MICRO STRL 7.5 (GLOVE) ×1 IMPLANT
GOWN STRL REUS W/ TWL LRG LVL3 (GOWN DISPOSABLE) ×2 IMPLANT
KIT BASIN OR (CUSTOM PROCEDURE TRAY) ×1 IMPLANT
KIT TURNOVER KIT B (KITS) ×1 IMPLANT
MATRIX TISSUE MESHED 4X5 (Tissue) IMPLANT
NDL HYPO 22X1.5 SAFETY MO (MISCELLANEOUS) IMPLANT
NDL PRECISIONGLIDE 27X1.5 (NEEDLE) ×1 IMPLANT
NEEDLE HYPO 22X1.5 SAFETY MO (MISCELLANEOUS) IMPLANT
NEEDLE PRECISIONGLIDE 27X1.5 (NEEDLE) ×1 IMPLANT
PAD ARMBOARD POSITIONER FOAM (MISCELLANEOUS) ×2 IMPLANT
SOLN 0.9% NACL POUR BTL 1000ML (IV SOLUTION) ×1 IMPLANT
SOLN STERILE WATER BTL 1000 ML (IV SOLUTION) ×1 IMPLANT
SPIKE FLUID TRANSFER (MISCELLANEOUS) ×1 IMPLANT
SUT CHROMIC 3 0 PS 2 (SUTURE) ×1 IMPLANT
SUT MNCRL AB 4-0 PS2 18 (SUTURE) ×1 IMPLANT
SUT PROLENE 3 0 SH 48 (SUTURE) IMPLANT
SUT VIC AB 3-0 SH 8-18 (SUTURE) IMPLANT
SUT VIC AB 4-0 RB1 18 (SUTURE) IMPLANT
SYR CONTROL 10ML LL (SYRINGE) ×1 IMPLANT
TRAY ENT MC OR (CUSTOM PROCEDURE TRAY) ×1 IMPLANT
TUBE CONNECTING 12X1/4 (SUCTIONS) ×1 IMPLANT
YANKAUER SUCT BULB TIP NO VENT (SUCTIONS) ×1 IMPLANT

## 2024-11-16 NOTE — H&P (Addendum)
 Discussed the use of AI scribe software for clinical note transcription with the patient, who gave verbal consent to proceed.  History of Present Illness Ethan [name]. is a 53 year old male who presents with concerns about an open fracture and oral care following surgery.  Ethan [name] is here for removal of hardware and possible repair of exposed bone in the lower left mandible.  He has been in MMF for close to 6 weeks.  He is continuing none chew diet.  He is still smoking.  He ran out of his oral rinse last week and is switched to hydrogen peroxide.  There have been no other changes in his health.  He is here with his girlfriend.  He understands after removal of this hardware that he should still maintain a very soft diet.  Physical Exam HEENT:  MMF wires in proper position Location of left Open fracture with decreased dehiscence - there was increased gingival growth superior to the ORIF plate. Increased plate exposure posteriorly. Two visible screws along left gingivobuccal sulcus. 2x2cm of alveolar bone exposure the left. 1cm of alveolar bone exposed on the right.   Assessment & Plan Delayed healing of left mandibular body fracture and comminuted right mandibular fracture, MMF.  post-ORIF Delayed healing with infection risk at open fracture site.   Ethan [name] is a 53 year old male here for removal of hardware, possible vestibuloplasty, possible adjacent tissue transfer.  This is to be done in the operating room.  Long discussion regarding postop dietary restrictions and oral care.  He is still to stop smoking tobacco use.  He is to continue oral rinses and an antibiotic.  All questions were answered.  Girlfriend is present and understanding as well.  Risks of possible additional surgeries with well as mandible fracture discussed.

## 2024-11-16 NOTE — Anesthesia Preprocedure Evaluation (Addendum)
"                                    Anesthesia Evaluation  Patient identified by MRN, date of birth, ID band Patient awake    Reviewed: Allergy & Precautions, NPO status , Patient's Chart, lab work & pertinent test results  History of Anesthesia Complications Negative for: history of anesthetic complications  Airway Mallampati: Unable to assess      Comment: Jaw Wired Shut - Available room for straw Dental  (+) Dental Advisory Given   Pulmonary Current Smoker and Patient abstained from smoking.   breath sounds clear to auscultation       Cardiovascular hypertension, Pt. 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   Endo/Other  diabetes, Type 2, Insulin  Dependent, Oral Hypoglycemic Agents    Renal/GU      Musculoskeletal  (+) Arthritis ,  Mandibular Fx s/p Wire   Abdominal   Peds  Hematology   Anesthesia Other Findings   Reproductive/Obstetrics                              Anesthesia Physical Anesthesia Plan  ASA: 2  Anesthesia Plan: General   Post-op Pain Management:    Induction: Intravenous  PONV Risk Score and Plan: 1  Airway Management Planned: Nasal ETT and Oral ETT  Additional Equipment: None  Intra-op Plan:   Post-operative Plan: Extubation in OR  Informed Consent: I have reviewed the patients History and Physical, chart, labs and discussed the procedure including the risks, benefits and alternatives for the proposed anesthesia with the patient or authorized representative who has indicated his/her understanding and acceptance.     Dental advisory given  Plan Discussed with: CRNA and Surgeon  Anesthesia Plan Comments:          Anesthesia Quick Evaluation  "

## 2024-11-16 NOTE — Discharge Instructions (Addendum)
 AVOID WIGGLING YOUR MANDIBLE, IT IS STILL HEALING STOP SMOKING TOBACCO REST YOUR JAW, IT IS STILL HEALING CONTINUE WITH A SOFT DIET TAKE THE ANTIBIOTICS AND PAIN MEDICATION AS DIRECTED.  USE THE ORAL RINSE AS DIRECTED. KEEP DOING THIS FOR 3 WEEKS  OKAY TO USE TYLENOL  FOR PAIN, AVOID IBUPROFEN DO NOT PLAY WITH THE BOLSTER ON THE LEFT SIDE OF YOUR MOUTH.  DO NOT PLAY WITH THE REPAIR ON THE RIGHT SIDE OF YOUR MOUTH.   CALL THE OFFICE FOR FEVER, FOUL DIARRHEA, WORSENING PAIN, WORSENING SWELLING, AND DRAINAGE

## 2024-11-16 NOTE — Brief Op Note (Signed)
 11/16/2024  2:28 PM  PATIENT:  Ethan [name].  53 y.o. male  PRE-OPERATIVE DIAGNOSIS:  Closed fracture of left side of mandibular body with delayed healing  POST-OPERATIVE DIAGNOSIS:  Closed fracture of left side of mandibular body with delayed healing  PROCEDURE:  Procedures with comments: REMOVAL, HARDWARE, MANDIBLE (N/A) - Removal of hardware (MMF) VESTIBULOPLASTY, MOUTH (N/A)  SURGEON:  Surgeons and Role:    * Anice Riis, DO - Primary  PHYSICIAN ASSISTANT:   ASSISTANTS: none   ANESTHESIA:   local and general  EBL:  10cc  BLOOD ADMINISTERED:none  DRAINS: none   LOCAL MEDICATIONS USED:  LIDOCAINE    SPECIMEN:  Scraping  DISPOSITION OF SPECIMEN:  PATHOLOGY  COUNTS:  YES  TOURNIQUET:  * No tourniquets in log *  DICTATION: .Note written in EPIC  PLAN OF CARE: Discharge to home after PACU  PATIENT DISPOSITION:  PACU - hemodynamically stable.   Delay start of Pharmacological VTE agent (>24hrs) due to surgical blood loss or risk of bleeding: no

## 2024-11-16 NOTE — Transfer of Care (Signed)
 Immediate Anesthesia Transfer of Care Note  Patient: Ethan [name].  Procedure(s) Performed: REMOVAL, HARDWARE, MANDIBLE VESTIBULOPLASTY, MOUTH  Patient Location: PACU  Anesthesia Type:General  Level of Consciousness: awake, alert , and oriented  Airway & Oxygen  Therapy: Patient Spontanous Breathing and Patient connected to nasal cannula oxygen   Post-op Assessment: Report given to RN and Post -op Vital signs reviewed and stable  Post vital signs: Reviewed and stable  Last Vitals:  Vitals Value Taken Time  BP 123/96 1445  Temp 97 1445  Pulse 95 1445  Resp 16 1445  SpO2 99 1445    Last Pain:  Vitals:   11/16/24 1025  TempSrc:   PainSc: 7       Patients Stated Pain Goal: 2 (11/16/24 1025)  Complications: No notable events documented.

## 2024-11-18 NOTE — Op Note (Signed)
 Otolaryngology Operative note  Ethan [name]. Date/Time of Admission: 11/16/2024  9:07 AM  CSN: [id number];MRN:[id number]  DOB: 08/29/1971 Age: 53 y.o. Location: [facility] OR    Pre-Op Diagnosis: Left body mandible open fracture Right ramus mandible fracture closed T84.7XXA - Infection and inflammatory reaction due to internal fixation device M27.8 - Other diseases of jaw (exposed mandibular bone) K13.29 - Other diseases of oral mucosa (gingival breakdown) S02.609G - Mandibular fracture with delayed healing (subsequent encounter) Z72.0 - Tobacco use  Post-Op Diagnosis: same  Procedure: Removal of maxillomandibular fixation - 20670 - 59  Removal of infected deep mandibular hardware 79319 Adjacent tissue transfer for intraoral soft tissue reconstruction (total defect size 2.8 cm) - 14040  Surgeon: Penne Croak, DO  Anesthesia type:  Choice  Anesthesiologist: Anesthesiologist: Dene Lauraine DASEN, MD CRNA: Scherrie Mast, CRNA   Staff: Circulator: Primus Leita HERO, RN Relief Circulator: Rosann Delon GRADE, RN Scrub Person: Evaline, Tiffany  Implants: Implant Name Type Inv. Item Serial No. Manufacturer Lot No. LRB No. Used Action  LOG [id number] - BIOMET OMNIMAX MMF SET - 1          MATRIX TISSUE MESHED 4X5 - [id number] Tissue MATRIX TISSUE MESHED 4X5  [employer] [id number] N/A 1 Implanted    Specimens: ID Type Source Tests Collected by Time Destination  A : Hardware- Necrotic Bone Tissue PATH ENT excision AEROBIC/ANAEROBIC CULTURE W GRAM STAIN (SURGICAL/DEEP WOUND) Croak Penne, DO 11/16/2024 1411    EBL: 15cc Drains: none  Post-op disposition and condition: PACU, hemodynamically stable   Findings: Maxillomandibular fixation in place from prior surgery Left mandible with exposed fixation plate and exposed screws Partial gingival regrowth superior to the plate with increased exposure of superior aspect of plate Two exposed screws freely spinning within  the plate - closest to fracture line Open fracture line still present on the left mandible without purulence. Right posterior mandible with approximately 1 cm diameter area of exposed bone No gross purulence encountered  Complications: None apparent  Indications and consent:  Ethan [name] is a 53 y.o. male bilateral mandibular fractures status post open reduction and internal fixation with maxillomandibular fixation performed approximately six weeks prior. He has remained in MMF during this period and has been maintained on a non-chew diet. He continues to smoke and recently discontinued his prescribed antimicrobial oral rinse. He was noted to have exposed mandibular hardware with associated gingival breakdown and exposed bone involving the left mandible and posterior right mandible, concerning for impaired healing and infection. The primary indication for surgery was removal of infected mandibular fixation hardware on the left with associated soft tissue reconstruction. Given that the maxillomandibular fixation had been placed during a prior operative encounter, operative removal was required. Risks, benefits, and alternatives were discussed, and informed consent was obtained.  Procedure: The patient was brought to the operating room and placed supine on the operating table. The patient arrived with maxillomandibular fixation (MMF) in place, which had been applied during a prior operative encounter approximately six weeks earlier. Mask anesthesia was initially administered. Time out performed. Wire cutters were used to remove the maxillomandibular fixation wires in their entirety. Following complete MMF removal, the patient was orally intubated, and an adequate plane of general anesthesia was established. Local anesthetic consisting of 1% lidocaine  with epinephrine  was injected into the gingival mucosa overlying the mandibular hardware. A total of nine screws were identified and removed in their  entirety. This portion of the procedure was completed as a distinct, staged step prior to the  subsequent surgical procedures.  Attention was directed to the left mandible, where exposed fixation hardware was identified. There was partial gingival regrowth superior to the plate; however, increased plate exposure was present. Two screws were exposed and freely spinning within the plate. The most anterior screw hole was fully covered by mucosa, while the posterior plate was exposed superiorly. No purulence was encountered, though an open fracture line remained visible. Given the degree of exposure and instability, the decision was made to remove the mandibular plate, which was accomplished without complication. The gingival tissue was repositioned and suspended using interrupted 3-0 Vicryl sutures. The exposed mandibular bone was lightly polished using a 4-diamond burr, and the mucosal edges were freshened. Fibrin glue was applied to the base of the wound, and a bilayer matrix graft was placed over the remaining exposed bone with the black lines facing outward. The graft was secured with interrupted 4-0 Vicryl sutures. A non-adherent bolster was placed over the graft and secured with 2-0 Prolene sutures.  Attention was then directed to the right posterior mandible, where an approximately 1 cm diameter circular area of exposed bone was identified. The bone was lightly freshened using a 4-diamond burr, and the mucosal edges were freshened. A lateral gingival rotation flap was mobilized and advanced to achieve tension-free coverage and secured with interrupted 3-0 Vicryl sutures. Dermabond was applied over the closure. Two local rotation flaps were created within the oral cavity to address the bilateral defects. The total surface area of adjacent tissue transfer within the oral cavity measured approximately 2.8 square centimeters. Hemostasis was achieved. The oral cavity was irrigated. The patient tolerated all  portions of the procedure well. All sponge and instrument counts were correct at the conclusion of the case.

## 2024-11-19 ENCOUNTER — Encounter (HOSPITAL_COMMUNITY): Payer: Self-pay

## 2024-11-19 ENCOUNTER — Telehealth (INDEPENDENT_AMBULATORY_CARE_PROVIDER_SITE_OTHER): Payer: Self-pay

## 2024-11-19 NOTE — Telephone Encounter (Signed)
 Pt called stating stitches are getting loose please advise

## 2024-11-19 NOTE — Anesthesia Postprocedure Evaluation (Signed)
"   Anesthesia Post Note  Patient: Ethan [name].  Procedure(s) Performed: REMOVAL, HARDWARE, MANDIBLE VESTIBULOPLASTY, MOUTH     Patient location during evaluation: PACU Anesthesia Type: General Level of consciousness: awake Pain management: pain level controlled Vital Signs Assessment: post-procedure vital signs reviewed and stable Respiratory status: spontaneous breathing Cardiovascular status: blood pressure returned to baseline Postop Assessment: no apparent nausea or vomiting Anesthetic complications: no   There were no known notable events for this encounter.  Last Vitals:  Vitals:   11/16/24 1500 11/16/24 1515  BP: (!) 117/91 (!) 120/93  Pulse: 86 88  Resp: 20 (!) 21  Temp:  36.7 C  SpO2: 96% 95%    Last Pain:  Vitals:   11/16/24 1430  TempSrc:   PainSc: 10-Worst pain ever                 Lauraine T Colhoun      "

## 2024-11-20 ENCOUNTER — Encounter (INDEPENDENT_AMBULATORY_CARE_PROVIDER_SITE_OTHER): Payer: Self-pay

## 2024-11-20 ENCOUNTER — Ambulatory Visit (INDEPENDENT_AMBULATORY_CARE_PROVIDER_SITE_OTHER): Payer: MEDICAID

## 2024-11-20 VITALS — BP 142/87 | HR 87

## 2024-11-20 DIAGNOSIS — Z09 Encounter for follow-up examination after completed treatment for conditions other than malignant neoplasm: Secondary | ICD-10-CM

## 2024-11-20 DIAGNOSIS — S02601G Fracture of unspecified part of body of right mandible, subsequent encounter for fracture with delayed healing: Secondary | ICD-10-CM

## 2024-11-20 DIAGNOSIS — S02670G Fracture of alveolus of mandible, unspecified side, subsequent encounter for fracture with delayed healing: Secondary | ICD-10-CM

## 2024-11-20 DIAGNOSIS — F17200 Nicotine dependence, unspecified, uncomplicated: Secondary | ICD-10-CM

## 2024-11-20 DIAGNOSIS — S02602G Fracture of unspecified part of body of left mandible, subsequent encounter for fracture with delayed healing: Secondary | ICD-10-CM

## 2024-11-20 MED ORDER — GABAPENTIN 100 MG PO CAPS: 100.0000 mg | ORAL_CAPSULE | Freq: Three times a day (TID) | ORAL | 0 refills | Status: DC

## 2024-11-21 LAB — AEROBIC/ANAEROBIC CULTURE W GRAM STAIN (SURGICAL/DEEP WOUND)
Culture: NORMAL
Gram Stain: NONE SEEN

## 2024-11-21 NOTE — Progress Notes (Signed)
 Discussed the use of AI scribe software for clinical note transcription with the patient, who gave verbal consent to proceed.  History of Present Illness Ethan [name]. is a 53 year old male with recent ORIF of left mandibular body and comminuted right mandibular fractures who presents for post-operative follow-up and suture management.  - Persistent numbness localized to the midline chin and upper lip - improvement in sensation since surgery  Mastication and oral function - Avoids mastication on the right side to prevent disruption of the surgical site and bolster - Chews on the contralateral side only - one suture loose on the left side - Remains compliant with post-operative instructions to minimize mandibular movement and prevent aspiration  Suture and bolster status - One intraoral suture has broken; remaining suture is still present - Performs oral rinses three times daily as instructed  Oral hygiene and debris accumulation - Accumulation of food debris around the bolster and dressing - Attempts to remove debris as tolerated, limited by the presence of the bolster  Physical Exam HEENT: Oral cavity appears normal. Left bolster in place. One suture removed. Integra in proper placement. No purulence. Right gingivobuccal sulcus with food bolus.   Assessment & Plan Delayed healing of left mandibular body fracture and comminuted right mandibular fracture, post-ORIF Persistent numbness and discomfort due to broken suture.  - No intervention recommended - Panorex reviewed and appropriate healing - Trimmed nonfunctional suture causing lingual irritation. - Reinforced instructions to avoid mastication on affected side. - Confirmed ongoing use of oral rinses three times daily. - Scheduled follow-up on Friday for bolster removal, integra to remain.  Still using tobacco - advised stopping - non chew diet

## 2024-11-23 ENCOUNTER — Encounter (INDEPENDENT_AMBULATORY_CARE_PROVIDER_SITE_OTHER): Payer: Self-pay

## 2024-11-23 ENCOUNTER — Ambulatory Visit (INDEPENDENT_AMBULATORY_CARE_PROVIDER_SITE_OTHER): Payer: MEDICAID

## 2024-11-23 VITALS — BP 104/67 | HR 105 | Temp 98.0°F | Wt 270.0 lb

## 2024-11-23 DIAGNOSIS — S02601G Fracture of unspecified part of body of right mandible, subsequent encounter for fracture with delayed healing: Secondary | ICD-10-CM

## 2024-11-23 DIAGNOSIS — Z09 Encounter for follow-up examination after completed treatment for conditions other than malignant neoplasm: Secondary | ICD-10-CM

## 2024-11-23 DIAGNOSIS — S02602G Fracture of unspecified part of body of left mandible, subsequent encounter for fracture with delayed healing: Secondary | ICD-10-CM

## 2024-11-23 DIAGNOSIS — S02670G Fracture of alveolus of mandible, unspecified side, subsequent encounter for fracture with delayed healing: Secondary | ICD-10-CM

## 2024-11-24 MED ORDER — CHLORHEXIDINE GLUCONATE 0.12 % MT SOLN: 15.0000 mL | Freq: Three times a day (TID) | OROMUCOSAL | 2 refills | Status: DC

## 2024-11-24 NOTE — Progress Notes (Signed)
 Discussed the use of AI scribe software for clinical note transcription with the patient, who gave verbal consent to proceed.  History of Present Illness Ethan [name]. is a 53 year old male with delayed healing of left mandibular body and comminuted right mandibular fractures, status post-ORIF, who presents for postoperative follow-up.  Mandibular fracture recovery - Delayed healing of left mandibular body and comminuted right mandibular fractures, status post open reduction and internal fixation (ORIF). - Continued recovery since last visit following completion of antibiotic course and use of oral rinses. - Nearly out of oral rinse and requests a refill. - chin numbness improving  Pain management - Pain has significantly improved. - No desire for additional analgesics. - Previously trialed gabapentin  for pain management but discontinued due to gastrointestinal intolerance.  Tobacco use - Actively working on reducing tobacco use.  Physical Exam  Good Occlusion. Midline chin with paresthesia -  Bolster removed on the left gingivobuccal sulcus. Integra in place Right posterior gingivobuccal sulcus healing. Food bolus removed. f  Assessment & Plan Delayed healing of mandibular fractures, post-ORIF Recovering from delayed healing of left mandibular body fracture and comminuted right mandibular fracture post-ORIF. Healing progressing, pain improved, declined further analgesics. Gabapentin  discontinued due to gastrointestinal intolerance. Reducing tobacco use, important for bone healing. - Continue soft diet. Recommend completing clindamycin  antibioitcs.  - Patient requested more oral rinse. - Scheduled follow-up in two weeks for removal of remaining integra bilayer.

## 2024-11-27 ENCOUNTER — Encounter (INDEPENDENT_AMBULATORY_CARE_PROVIDER_SITE_OTHER): Payer: Self-pay

## 2024-11-27 ENCOUNTER — Ambulatory Visit (INDEPENDENT_AMBULATORY_CARE_PROVIDER_SITE_OTHER): Payer: MEDICAID

## 2024-11-27 VITALS — BP 118/74 | HR 91 | Wt 266.0 lb

## 2024-11-27 VITALS — BP 122/72 | HR 101 | Ht 75.0 in | Wt 266.1 lb

## 2024-11-27 DIAGNOSIS — S02670G Fracture of alveolus of mandible, unspecified side, subsequent encounter for fracture with delayed healing: Secondary | ICD-10-CM

## 2024-11-27 DIAGNOSIS — E119 Type 2 diabetes mellitus without complications: Secondary | ICD-10-CM | POA: Diagnosis not present

## 2024-11-27 DIAGNOSIS — Z09 Encounter for follow-up examination after completed treatment for conditions other than malignant neoplasm: Secondary | ICD-10-CM

## 2024-11-27 DIAGNOSIS — Z7984 Long term (current) use of oral hypoglycemic drugs: Secondary | ICD-10-CM

## 2024-11-27 DIAGNOSIS — I10 Essential (primary) hypertension: Secondary | ICD-10-CM | POA: Diagnosis not present

## 2024-11-27 DIAGNOSIS — E139 Other specified diabetes mellitus without complications: Secondary | ICD-10-CM

## 2024-11-27 DIAGNOSIS — S02651D Fracture of angle of right mandible, subsequent encounter for fracture with routine healing: Secondary | ICD-10-CM

## 2024-11-27 DIAGNOSIS — S02609G Fracture of mandible, unspecified, subsequent encounter for fracture with delayed healing: Secondary | ICD-10-CM

## 2024-11-27 NOTE — Assessment & Plan Note (Signed)
 Blood pressure well-controlled at 122/72 mmHg. - Continue current management.

## 2024-11-27 NOTE — Progress Notes (Signed)
 "  New Patient Office Visit  Subjective    Patient ID: Ethan Liew., male    DOB: 08/16/1971  Age: 53 y.o. MRN: [id number]  CC:  Chief Complaint  Patient presents with   Establish Care   Hospitalization Follow-up    HPI Ethan [name]. presents to establish care Discussed the use of AI scribe software for clinical note transcription with the patient, who gave verbal consent to proceed.  History of Present Illness    Ethan Claud. is a 53 year old male with diabetes who presents for establishment of care.  Glycemic control and diabetes management - Diabetes management has improved significantly. - Blood glucose levels have been stable between 110 and 120 mg/dL. - A1c was 5.7% two months ago, leading to discontinuation of insulin  therapy. - Previously used Lantus , but has not used it recently. - Previously on oral hypoglycemic agent, discontinued due to hypoglycemia. - Intermittent use of Jardiance, with last dose taken four days ago. - No current symptoms related to prior pancreatic infection. - Past hospitalization for pancreatic infection with A1c of 8.7%.  Weight loss and nutritional status - Weight loss attributed to changes in eating habits. - Currently eating well and tolerating solid foods.  Nicotine  use and smoking cessation - Cutting back on smoking. - Using nicotine  patches, which are helpful.      Outpatient Encounter Medications as of 11/27/2024  Medication Sig   allopurinol  (ZYLOPRIM ) 300 MG tablet Take 300 mg by mouth every morning.   amLODipine  (NORVASC ) 10 MG tablet Take 10 mg by mouth every morning.   chlorhexidine  (PERIDEX ) 0.12 % solution Use as directed 15 mLs in the mouth or throat in the morning, at noon, and at bedtime.   clindamycin  (CLEOCIN ) 300 MG capsule Take 1 capsule (300 mg total) by mouth 3 (three) times daily.   JARDIANCE 10 MG TABS tablet Take 10 mg by mouth daily.   lisinopril  (ZESTRIL ) 20 MG tablet Take 20 mg by mouth  every morning.   nicotine  (NICODERM CQ  - DOSED IN MG/24 HR) 7 mg/24hr patch RX #2 Weeks 7-8: 7 mg x 1 patch dailyWear for 24 hours. If you have sleep disturbances, remove at bedtime..   nicotine  polacrilex (COMMIT) 4 MG lozenge Take 1 lozenge (4 mg total) by mouth as needed for smoking cessation.   omeprazole (PRILOSEC) 40 MG capsule Take 40 mg by mouth every morning.   polyethylene glycol powder (GLYCOLAX /MIRALAX ) 17 GM/SCOOP powder Take 17 g by mouth daily as needed for mild constipation. Dissolve 1 capful (17gm)  in 4-8 ounces of liquid  and take by mouth daily as needed for mild constipation (Patient taking differently: Take 17 g by mouth daily as needed for mild constipation.)   pravastatin  (PRAVACHOL ) 40 MG tablet Take 40 mg by mouth every morning.   tadalafil  (CIALIS ) 20 MG tablet Take 20 mg by mouth daily as needed for erectile dysfunction.   [DISCONTINUED] gabapentin  (NEURONTIN ) 100 MG capsule Take 1 capsule (100 mg total) by mouth 3 (three) times daily. (Patient not taking: Reported on 11/27/2024)   [DISCONTINUED] insulin  glargine (LANTUS ) 100 UNIT/ML injection Inject into the skin. (Patient not taking: Reported on 11/27/2024)   No facility-administered encounter medications on file as of 11/27/2024.    Past Medical History:  Diagnosis Date   Asthma    Cellulitis    hospitalized 07/2013   Diabetes mellitus without complication (HCC)    Diverticulitis    06/2012   Diverticulitis  GERD (gastroesophageal reflux disease)    Gout    History of cellulitis    Hypercholesterolemia    Hypertension    Hypokalemia    Microscopic hematuria    Pneumonia    Tobacco use    Urethral stricture     Past Surgical History:  Procedure Laterality Date   ARM WOUND REPAIR / CLOSURE     COLONOSCOPY WITH PROPOFOL  N/A 12/12/2023   Procedure: COLONOSCOPY WITH PROPOFOL ;  Surgeon: Shaaron Lamar HERO, MD;  Location: [facility];  Service: Endoscopy;  Laterality: N/A;  7:30 am, asa 2   CYSTOSCOPY WITH  URETHRAL DILATATION N/A 04/05/2024   Procedure: CYSTOSCOPY, WITH URETHRAL DILATION;  Surgeon: Penne Knee, MD;  Location: [facility] ORS;  Service: Urology;  Laterality: N/A;  BALLOON DILATION USING OPTILUME   ESOPHAGOGASTRODUODENOSCOPY N/A 11/14/2013   MFM:Ypjujo hernia; otherwise negative examination   MANDIBULAR HARDWARE REMOVAL N/A 11/16/2024   Procedure: REMOVAL, HARDWARE, MANDIBLE;  Surgeon: Anice Penne, DO;  Location: [facility] OR;  Service: ENT;  Laterality: N/A;  Removal of hardware (MMF)   ORIF MANDIBULAR FRACTURE Bilateral 10/09/2024   Procedure: OPEN REDUCTION INTERNAL FIXATION (ORIF) MANDIBULAR FRACTURE;  Surgeon: Anice Penne, DO;  Location: [facility] OR;  Service: ENT;  Laterality: Bilateral;   WRIST ARTHROCENTESIS     WRIST SURGERY  12/2013   right    Family History  Problem Relation Age of Onset   Colon cancer Neg Hx     Social History   Socioeconomic History   Marital status: Single    Spouse name: Not on file   Number of children: 3   Years of education: Not on file   Highest education level: Not on file  Occupational History   Not on file  Tobacco Use   Smoking status: Every Day    Current packs/day: 0.50    Average packs/day: 0.5 packs/day for 36.0 years (18.0 ttl pk-yrs)    Types: Cigarettes    Start date: 65   Smokeless tobacco: Never   Tobacco comments:    1/2 pack daily  Vaping Use   Vaping status: Never Used  Substance and Sexual Activity   Alcohol use: Yes    Alcohol/week: 1.0 standard drink of alcohol    Types: 1 Cans of beer per week    Comment: heavy use on weekends   Drug use: Never   Sexual activity: Not Currently    Birth control/protection: None  Other Topics Concern   Not on file  Social History Narrative   ** Merged History Encounter **       Social Drivers of Health   Tobacco Use: High Risk (11/27/2024)   Patient History    Smoking Tobacco Use: Every Day    Smokeless Tobacco Use: Never    Passive Exposure: Not on file  Financial  Resource Strain: Low Risk (06/19/2024)   Received from [facility]   Overall Financial Resource Strain (CARDIA)    How hard is it for you to pay for the very basics like food, housing, medical care, and heating?: Not hard at all  Food Insecurity: No Food Insecurity (10/10/2024)   Epic    Worried About Radiation Protection Practitioner of Food in the Last Year: Never true    Ran Out of Food in the Last Year: Never true  Transportation Needs: No Transportation Needs (10/10/2024)   Epic    Lack of Transportation (Medical): No    Lack of Transportation (Non-Medical): No  Physical Activity: Insufficiently Active (06/19/2024)   Received  from [facility]   Exercise Vital Sign    On average, how many days per week do you engage in moderate to strenuous exercise (like a brisk walk)?: 2 days    On average, how many minutes do you engage in exercise at this level?: 10 min  Stress: No Stress Concern Present (06/19/2024)   Received from [facility] - Occupational Stress Questionnaire    Do you feel stress - tense, restless, nervous, or anxious, or unable to sleep at night because your mind is troubled all the time - these days?: Only a little  Social Connections: Moderately Integrated (06/19/2024)   Received from [facility]   Social Connection and Isolation Panel    In a typical week, how many times do you talk on the phone with family, friends, or neighbors?: More than three times a week    How often do you get together with friends or relatives?: More than three times a week    How often do you attend church or religious services?: More than 4 times per year    Do you belong to any clubs or organizations such as church groups, unions, fraternal or athletic groups, or school groups?: Yes    How often do you attend meetings of the clubs or organizations you belong to?: More than 4 times per year    Marital Status: Divorced  Intimate Partner Violence: Not At Risk  (10/10/2024)   Epic    Fear of Current or Ex-Partner: No    Emotionally Abused: No    Physically Abused: No    Sexually Abused: No  Depression (PHQ2-9): Low Risk (11/07/2024)   Depression (PHQ2-9)    PHQ-2 Score: 0  Alcohol Screen: Not on file  Housing: Low Risk (10/10/2024)   Epic    Unable to Pay for Housing in the Last Year: No    Number of Times Moved in the Last Year: 0    Homeless in the Last Year: No  Utilities: Not At Risk (10/10/2024)   Epic    Threatened with loss of utilities: No  Health Literacy: Low Risk (06/19/2024)   Received from [facility] Literacy    How often do you need to have someone help you when you read instructions, pamphlets, or other written material from your doctor or pharmacy?: Never    ROS      Objective    BP 122/72 (BP Location: Left Arm, Patient Position: Sitting, Cuff Size: Normal)   Pulse (!) 101   Ht 6' 3 (1.905 m)   Wt 266 lb 1.3 oz (120.7 kg)   SpO2 92%   BMI 33.26 kg/m   Physical Exam Vitals and nursing note reviewed.  Constitutional:      Appearance: Normal appearance. He is obese.  HENT:     Head: Normocephalic.  Eyes:     Extraocular Movements: Extraocular movements intact.     Pupils: Pupils are equal, round, and reactive to light.  Cardiovascular:     Rate and Rhythm: Normal rate and regular rhythm.  Pulmonary:     Effort: Pulmonary effort is normal.     Breath sounds: Normal breath sounds.  Musculoskeletal:     Cervical back: Normal range of motion and neck supple.  Neurological:     Mental Status: He is alert and oriented to person, place, and time.  Psychiatric:        Mood and Affect:  Mood normal.        Thought Content: Thought content normal.     Diabetic foot exam was performed with the following findings:   No deformities, ulcerations, or other skin breakdown Normal sensation of 10g monofilament Intact posterior tibialis and dorsalis pedis pulses     Last CBC Lab Results   Component Value Date   WBC 5.9 11/16/2024   HGB 14.5 11/16/2024   HCT 43.7 11/16/2024   MCV 99.1 11/16/2024   MCH 32.9 11/16/2024   RDW 13.5 11/16/2024   PLT 231 11/16/2024   Last metabolic panel Lab Results  Component Value Date   GLUCOSE 123 (H) 11/16/2024   NA 137 11/16/2024   K 4.1 11/16/2024   CL 100 11/16/2024   CO2 24 11/16/2024   BUN 13 11/16/2024   CREATININE 0.85 11/16/2024   GFRNONAA >60 11/16/2024   CALCIUM 10.2 11/16/2024   PHOS 3.3 10/09/2024   PROT 7.9 08/12/2024   ALBUMIN  3.7 08/12/2024   BILITOT 1.0 08/12/2024   ALKPHOS 92 08/12/2024   AST 27 08/12/2024   ALT 31 08/12/2024   ANIONGAP 13 11/16/2024   Last lipids Lab Results  Component Value Date   CHOL  04/02/2010    187        ATP III CLASSIFICATION:  <200     mg/dL   Desirable  799-760  mg/dL   Borderline High  >=759    mg/dL   High          HDL 35 (L) 04/02/2010   LDLCALC (H) 04/02/2010    136        Total Cholesterol/HDL:CHD Risk Coronary Heart Disease Risk Table                     Men   Women  1/2 Average Risk   3.4   3.3  Average Risk       5.0   4.4  2 X Average Risk   9.6   7.1  3 X Average Risk  23.4   11.0        Use the calculated Patient Ratio above and the CHD Risk Table to determine the patient's CHD Risk.        ATP III CLASSIFICATION (LDL):  <100     mg/dL   Optimal  899-870  mg/dL   Near or Above                    Optimal  130-159  mg/dL   Borderline  839-810  mg/dL   High  >809     mg/dL   Very High   TRIG 82 94/94/7988   CHOLHDL 5.3 04/02/2010   Last hemoglobin A1c Lab Results  Component Value Date   HGBA1C 8.7 (H) 08/13/2024   Last thyroid functions Lab Results  Component Value Date   TSH 0.999 10/19/2010   FREET4 1.12 10/19/2010      Assessment & Plan:   Problem List Items Addressed This Visit       Cardiovascular and Mediastinum   Hypertension - Primary   Blood pressure well-controlled at 122/72 mmHg. - Continue current management.         Endocrine   Diabetes mellitus (HCC)   Diabetes well-controlled with blood glucose 110-120 mg/dL, J8r 4.2% two months ago. Discontinued Lantus  and Jardiance due to stable glucose and past hypoglycemia. - Discontinued Lantus . - Held Jardiance for future use if needed. - Recheck A1c in three months.  Return in about 3 months (around 02/25/2025) for chronic follow-up with PCP.   Leita Longs, FNP   "

## 2024-11-27 NOTE — Assessment & Plan Note (Signed)
 Diabetes well-controlled with blood glucose 110-120 mg/dL, J8r 4.2% two months ago. Discontinued Lantus  and Jardiance due to stable glucose and past hypoglycemia. - Discontinued Lantus . - Held Jardiance for future use if needed. - Recheck A1c in three months.

## 2024-11-29 NOTE — Progress Notes (Signed)
 Discussed the use of AI scribe software for clinical note transcription with the patient, who gave verbal consent to proceed.  History of Present Illness Ethan [name]. is a 54 year old male with mandibular fractures, status post open reduction and internal fixation, who presents for postoperative follow-up.  Mandibular pain and palpable abnormality - Palpable area along the jaw described as a little bone chip - Increased prominence of the area since last Friday - Increased concern regarding the palpable abnormality  Jaw mobility and function - Mild mobility in the jaw during mastication when chewing food on the left side  Dietary modifications and tolerance - Adhering to a soft food diet, including chopped meatballs and boiled chicken - No gastrointestinal discomfort with soft foods -  still has chlorhexidine   Neurologic sensation - Improvement in jaw numbness with gradual resolution of sensation  Intraoral foreign material - Awareness of integra  Physical Exam Small 2mm bone piece like an eggshelll removed. Increased gingiva. No mobility along fracture line. Integra still in place and secured. Poor dentition. Decreased bone exposed. No erythema, purulence. Neck soft without edema. Full ROM head. Facial strength intact.     Assessment and Plan Assessment & Plan Delayed healing of left mandibular body and comminuted right mandibular fractures, status post open reduction and internal fixation (ORIF) Recovering from mandibular fractures with delayed healing post-ORIF. Healing progressing without infection; numbness improving. Palpable bone and jaw movement expected, not indicating complication. Compliance with soft diet and oral rinses aiding recovery. - Reassured imaging and physical exam demonstrate expected slow improvement. Repeat imaging discussed and patient declined.  - Reinforced adherence to soft diet and avoidance of hard foods. - Advised continuation of prescribed  oral rinses. - Recommend NO TOBACCO USE.  - Deferred repeat radiography; prior imaging showed appropriate healing, no new symptoms. - Provided guidance on expected sensations and reassurance on healing. - Scheduled follow-up in one week.

## 2024-12-07 ENCOUNTER — Ambulatory Visit (INDEPENDENT_AMBULATORY_CARE_PROVIDER_SITE_OTHER): Payer: MEDICAID

## 2024-12-07 ENCOUNTER — Encounter (INDEPENDENT_AMBULATORY_CARE_PROVIDER_SITE_OTHER): Payer: Self-pay

## 2024-12-07 VITALS — BP 122/83 | HR 109 | Temp 98.0°F | Wt 266.0 lb

## 2024-12-07 DIAGNOSIS — Z09 Encounter for follow-up examination after completed treatment for conditions other than malignant neoplasm: Secondary | ICD-10-CM

## 2024-12-07 DIAGNOSIS — G8929 Other chronic pain: Secondary | ICD-10-CM

## 2024-12-07 DIAGNOSIS — K089 Disorder of teeth and supporting structures, unspecified: Secondary | ICD-10-CM

## 2024-12-07 DIAGNOSIS — M8448XP Pathological fracture, other site, subsequent encounter for fracture with malunion: Secondary | ICD-10-CM

## 2024-12-07 DIAGNOSIS — S02609G Fracture of mandible, unspecified, subsequent encounter for fracture with delayed healing: Secondary | ICD-10-CM

## 2024-12-09 ENCOUNTER — Other Ambulatory Visit: Payer: Self-pay

## 2024-12-09 ENCOUNTER — Encounter (HOSPITAL_COMMUNITY): Payer: Self-pay | Admitting: Emergency Medicine

## 2024-12-09 ENCOUNTER — Emergency Department (HOSPITAL_COMMUNITY)
Admission: EM | Admit: 2024-12-09 | Discharge: 2024-12-09 | Disposition: A | Discharge status: Home / Self Care | Payer: MEDICAID | Attending: Emergency Medicine | Admitting: Emergency Medicine

## 2024-12-09 DIAGNOSIS — Z711 Person with feared health complaint in whom no diagnosis is made: Secondary | ICD-10-CM | POA: Diagnosis present

## 2024-12-09 DIAGNOSIS — F1010 Alcohol abuse, uncomplicated: Secondary | ICD-10-CM | POA: Insufficient documentation

## 2024-12-09 DIAGNOSIS — R110 Nausea: Secondary | ICD-10-CM | POA: Diagnosis not present

## 2024-12-09 DIAGNOSIS — Y904 Blood alcohol level of 80-99 mg/100 ml: Secondary | ICD-10-CM | POA: Insufficient documentation

## 2024-12-09 LAB — CBC WITH DIFFERENTIAL/PLATELET
Abs Immature Granulocytes: 0.03 K/uL (ref 0.00–0.07)
Basophils Absolute: 0 K/uL (ref 0.0–0.1)
Basophils Relative: 0 %
Eosinophils Absolute: 0.3 K/uL (ref 0.0–0.5)
Eosinophils Relative: 3 %
HCT: 37.2 % — ABNORMAL LOW (ref 39.0–52.0)
Hemoglobin: 12.4 g/dL — ABNORMAL LOW (ref 13.0–17.0)
Immature Granulocytes: 0 %
Lymphocytes Relative: 30 %
Lymphs Abs: 2.6 K/uL (ref 0.7–4.0)
MCH: 32.4 pg (ref 26.0–34.0)
MCHC: 33.3 g/dL (ref 30.0–36.0)
MCV: 97.1 fL (ref 80.0–100.0)
Monocytes Absolute: 0.4 K/uL (ref 0.1–1.0)
Monocytes Relative: 5 %
Neutro Abs: 5.4 K/uL (ref 1.7–7.7)
Neutrophils Relative %: 62 %
Platelets: 286 K/uL (ref 150–400)
RBC: 3.83 MIL/uL — ABNORMAL LOW (ref 4.22–5.81)
RDW: 14.9 % (ref 11.5–15.5)
WBC: 8.7 K/uL (ref 4.0–10.5)
nRBC: 0 % (ref 0.0–0.2)

## 2024-12-09 LAB — COMPREHENSIVE METABOLIC PANEL WITH GFR
ALT: 11 U/L (ref 0–44)
AST: 21 U/L (ref 15–41)
Albumin: 4.1 g/dL (ref 3.5–5.0)
Alkaline Phosphatase: 86 U/L (ref 38–126)
Anion gap: 16 — ABNORMAL HIGH (ref 5–15)
BUN: 10 mg/dL (ref 6–20)
CO2: 25 mmol/L (ref 22–32)
Calcium: 9.6 mg/dL (ref 8.9–10.3)
Chloride: 96 mmol/L — ABNORMAL LOW (ref 98–111)
Creatinine, Ser: 0.89 mg/dL (ref 0.61–1.24)
GFR, Estimated: >60 mL/min
Glucose, Bld: 106 mg/dL — ABNORMAL HIGH (ref 70–99)
Potassium: 3.8 mmol/L (ref 3.5–5.1)
Sodium: 137 mmol/L (ref 135–145)
Total Bilirubin: 0.5 mg/dL (ref 0.0–1.2)
Total Protein: 8.3 g/dL — ABNORMAL HIGH (ref 6.5–8.1)

## 2024-12-09 LAB — URINE DRUG SCREEN
Amphetamines: NEGATIVE
Barbiturates: NEGATIVE
Benzodiazepines: NEGATIVE
Cocaine: NEGATIVE
Fentanyl: NEGATIVE
Methadone Scn, Ur: NEGATIVE
Opiates: NEGATIVE
Tetrahydrocannabinol: NEGATIVE

## 2024-12-09 LAB — LIPASE, BLOOD: Lipase: 30 U/L (ref 11–51)

## 2024-12-09 LAB — ETHANOL: Alcohol, Ethyl (B): 80 mg/dL — ABNORMAL HIGH

## 2024-12-09 MED ORDER — ONDANSETRON HCL 4 MG/2ML IJ SOLN
4.0000 mg | Freq: Once | INTRAMUSCULAR | Status: AC
  Administered 2024-12-09: 4 mg via INTRAVENOUS
  Filled 2024-12-09: qty 2

## 2024-12-09 MED ORDER — CHLORHEXIDINE GLUCONATE 0.12 % MT SOLN: 15.0000 mL | Freq: Three times a day (TID) | OROMUCOSAL | 2 refills | Status: AC

## 2024-12-09 MED ORDER — ONDANSETRON 4 MG PO TBDP: 4.0000 mg | ORAL_TABLET | Freq: Three times a day (TID) | ORAL | 0 refills | Status: AC | PRN
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
## 2024-12-09 NOTE — Progress Notes (Signed)
 Discussed the use of AI scribe software for clinical note transcription with the patient, who gave verbal consent to proceed.  History of Present Illness Ethan [name]. is a 54 year old male with delayed healing of left mandibular body and comminuted right mandibular fractures, status post open reduction and internal fixation, who presents with jaw pain and persistent numbness.  Mandibular Pain and Numbness: - Persistent severe pain localized to the jaw, described as both numb and painful - Pain has been variable since the injury. Currently pain is located right angle and left body of the mandible. There is tenderness to palpation - Present at rest and when lying down - Associated with soreness and swelling - Severe enough to interfere with sleep and prevent him from working for several days  Mandibular Swelling and Sensory Changes: - Ongoing numbness and swelling in the mandibular region - Some improvement in jaw ROM since surgery. No movement along the fracture lines.   Postoperative Care and Diet: - Approximately one month post open reduction and internal fixation of left mandibular body and comminuted right mandibular fractures - Maintains a soft diet - Uses oral rinses three times daily and requests a refill - Unsure if discontinuation of antibiotics contributed to worsening pain - Still using tobacco. Sugars have been fairly controlled.   Physical Exam HEENT: Atraumatic, normocephalic. Oral cavity healing. Integra removed as well as 3 vircyl sutures. There was tenderness at suture site. Jaw ROM good. Exposed alveolus bilaterally reduced. Minimal on the right and left. No mobility along fracture site. Tenderness to palpation at bilateral fracture sites.  Decreased numbness central lower lip.   Assessment & Plan Delayed healing of left mandibular body and comminuted right mandibular fractures, status post open reduction and internal fixation. S/p deep hardware removal, arch bar  removal and integra graft placement.   - integra removed in office today. Improved gingival regrowth  - no mandible mobility on exam  - tenderness to palpation over fracture sites with mild edema. No purulence  Mandible pain - etiology unclear given physical exam. No purulence or mobility. Improved appearance, but tenderness to palpation. Dental vs malunion vs osteomyelitis.   - recommend CT, rule out ominous process Refilled oral rinse; instructed to use three times daily for lower jaw. Advised soft diet for two months, with potential adjustment based on CT findings. Instructed to brush upper teeth and maintain oral hygiene. Advised to await CT results before prescribing additional analgesics. Instructed to contact office if not notified about CT results within one week. Discussed follow-up appointment timing dependent on CT findings, with earlier return if indicated.
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
## 2024-12-14 ENCOUNTER — Encounter (INDEPENDENT_AMBULATORY_CARE_PROVIDER_SITE_OTHER): Payer: Self-pay

## 2024-12-17 ENCOUNTER — Ambulatory Visit (HOSPITAL_COMMUNITY)
Admission: RE | Admit: 2024-12-17 | Discharge: 2024-12-17 | Disposition: A | Discharge status: Home / Self Care | Payer: MEDICAID | Source: Ambulatory Visit

## 2024-12-17 DIAGNOSIS — K089 Disorder of teeth and supporting structures, unspecified: Secondary | ICD-10-CM | POA: Insufficient documentation

## 2024-12-17 DIAGNOSIS — M8448XP Pathological fracture, other site, subsequent encounter for fracture with malunion: Secondary | ICD-10-CM | POA: Diagnosis present

## 2024-12-17 DIAGNOSIS — G8929 Other chronic pain: Secondary | ICD-10-CM | POA: Diagnosis present

## 2024-12-18 ENCOUNTER — Telehealth (INDEPENDENT_AMBULATORY_CARE_PROVIDER_SITE_OTHER): Payer: Self-pay

## 2024-12-18 NOTE — Telephone Encounter (Signed)
 Called patient back regarding voicemail that he left. Patient explains that he feels a bone in his mouth again and wants to know if Dr. Anice will see him in office again. I explained that Dr. Anice is out of the office until next week. Patient understood. Please advise.

## 2024-12-24 ENCOUNTER — Ambulatory Visit (INDEPENDENT_AMBULATORY_CARE_PROVIDER_SITE_OTHER): Payer: Self-pay

## 2024-12-24 ENCOUNTER — Emergency Department (HOSPITAL_COMMUNITY)
Admission: EM | Admit: 2024-12-24 | Discharge: 2024-12-24 | Disposition: A | Discharge status: Home / Self Care | Payer: MEDICAID | Attending: Emergency Medicine | Admitting: Emergency Medicine

## 2024-12-24 ENCOUNTER — Encounter (HOSPITAL_COMMUNITY): Payer: Self-pay

## 2024-12-24 ENCOUNTER — Emergency Department (HOSPITAL_COMMUNITY): Payer: MEDICAID

## 2024-12-24 ENCOUNTER — Other Ambulatory Visit: Payer: Self-pay

## 2024-12-24 ENCOUNTER — Ambulatory Visit (INDEPENDENT_AMBULATORY_CARE_PROVIDER_SITE_OTHER): Payer: MEDICAID

## 2024-12-24 DIAGNOSIS — B349 Viral infection, unspecified: Secondary | ICD-10-CM | POA: Diagnosis not present

## 2024-12-24 DIAGNOSIS — R059 Cough, unspecified: Secondary | ICD-10-CM | POA: Diagnosis present

## 2024-12-24 NOTE — ED Triage Notes (Signed)
 Pt reports cough, headache with a few episodes of diarrhea x 1 week.

## 2024-12-24 NOTE — ED Notes (Signed)
 ..  The patient is A&OX4, ambulatory at d/c with independent steady gait, NAD. Pt verbalized understanding of d/c instructions and follow up care.

## 2024-12-24 NOTE — Discharge Instructions (Signed)
 Evaluation today was overall reassuring.  Suspect this is likely a viral illness.  Treatment is mostly supportive which includes rest, hydration with water  and Gatorade, he can take Tylenol  ibuprofen as needed.  Please follow-up with your PCP.  If your symptoms worsen you can return here for further evaluation.
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
## 2024-12-24 NOTE — ED Provider Notes (Signed)
 " [place] EMERGENCY DEPARTMENT AT [facility] Provider Note   CSN: [id number] Arrival date & time: 12/24/24  1018     Patient presents with: Cough  HPI Ethan [name]. is a 54 y.o. male presenting for cough and intermittent headache along with diarrhea for a week.  He states the headache has subsided yesterday but having diarrhea intermittently with a nonproductive cough.  He denies chest pain shortness of breath and abdominal pain.  Denies fever.  He is requesting a work note.    Cough      Prior to Admission medications  Medication Sig Start Date End Date Taking? Authorizing Provider  allopurinol  (ZYLOPRIM ) 300 MG tablet Take 300 mg by mouth every morning. 01/24/24   [provider]  amLODipine  (NORVASC ) 10 MG tablet Take 10 mg by mouth every morning.    [provider]  chlorhexidine  (PERIDEX ) 0.12 % solution Use as directed 15 mLs in the mouth or throat in the morning, at noon, and at bedtime. 12/09/24   Anice Riis, DO  clindamycin  (CLEOCIN ) 300 MG capsule Take 1 capsule (300 mg total) by mouth 3 (three) times daily. 11/16/24   Anice Riis, DO  JARDIANCE 10 MG TABS tablet Take 10 mg by mouth daily. 09/19/24   [provider]  lisinopril  (ZESTRIL ) 20 MG tablet Take 20 mg by mouth every morning.    [provider]  nicotine  (NICODERM CQ  - DOSED IN MG/24 HR) 7 mg/24hr patch RX #2 Weeks 7-8: 7 mg x 1 patch dailyWear for 24 hours. If you have sleep disturbances, remove at bedtime.. 10/16/24   Anice Riis, DO  nicotine  polacrilex (COMMIT) 4 MG lozenge Take 1 lozenge (4 mg total) by mouth as needed for smoking cessation. 11/16/24   Anice Riis, DO  omeprazole (PRILOSEC) 40 MG capsule Take 40 mg by mouth every morning.    [provider]  ondansetron  (ZOFRAN -ODT) 4 MG disintegrating tablet Take 1 tablet (4 mg total) by mouth every 8 (eight) hours as needed for nausea or vomiting. 12/09/24   Roselyn Carlin NOVAK, MD   polyethylene glycol powder (GLYCOLAX /MIRALAX ) 17 GM/SCOOP powder Take 17 g by mouth daily as needed for mild constipation. Dissolve 1 capful (17gm)  in 4-8 ounces of liquid  and take by mouth daily as needed for mild constipation Patient taking differently: Take 17 g by mouth daily as needed for mild constipation. 10/13/24   Briana Elgin LABOR, MD  pravastatin  (PRAVACHOL ) 40 MG tablet Take 40 mg by mouth every morning.    [provider]  tadalafil  (CIALIS ) 20 MG tablet Take 20 mg by mouth daily as needed for erectile dysfunction. 11/01/24   [provider]    Allergies: Penicillins    Review of Systems  Respiratory:  Positive for cough.     Updated Vital Signs BP (!) 145/83 (BP Location: Right Arm)   Pulse 96   Temp 98.4 F (36.9 C) (Oral)   Resp 16   Wt 127 kg   SpO2 96%   BMI 35.00 kg/m   Physical Exam Vitals and nursing note reviewed.  HENT:     Head: Normocephalic and atraumatic.     Mouth/Throat:     Mouth: Mucous membranes are moist.  Eyes:     General:        Right eye: No discharge.        Left eye: No discharge.     Conjunctiva/sclera: Conjunctivae normal.  Cardiovascular:     Rate and  Rhythm: Normal rate and regular rhythm.     Pulses: Normal pulses.     Heart sounds: Normal heart sounds.  Pulmonary:     Effort: Pulmonary effort is normal.     Breath sounds: Normal breath sounds and air entry. No decreased breath sounds, wheezing, rhonchi or rales.  Abdominal:     General: Abdomen is flat. There is no distension.     Palpations: Abdomen is soft.     Tenderness: There is no abdominal tenderness.  Skin:    General: Skin is warm and dry.  Neurological:     General: No focal deficit present.  Psychiatric:        Mood and Affect: Mood normal.     (all labs ordered are listed, but only abnormal results are displayed) Labs Reviewed - No data to display  EKG: None  Radiology: DG Chest 2 View Result Date: 12/24/2024 EXAM: 2 VIEW(S) XRAY  OF THE CHEST 12/24/2024 10:59:00 AM COMPARISON: 05/19/2024 CLINICAL HISTORY: Cough. FINDINGS: LUNGS AND PLEURA: No focal pulmonary opacity. No pleural effusion. No pneumothorax. HEART AND MEDIASTINUM: No acute abnormality of the cardiac and mediastinal silhouettes. BONES AND SOFT TISSUES: No acute osseous abnormality. IMPRESSION: 1. No acute findings. Electronically signed by: Waddell Calk MD 12/24/2024 11:12 AM EST RP Workstation: [id number]     Procedures   Medications Ordered in the ED - No data to display                                  Medical Decision Making Amount and/or Complexity of Data Reviewed Radiology: ordered.   54 year old well-appearing male presenting for headache diarrhea cough.  Exam is unremarkable.  During this encounter he has denied headache.  Suspect this is likely a viral illness.  Chest x-ray was negative for acute findings.  He is afebrile here.  Advised supportive care.  Advise follow-up with PCP.  Discussed return precautions.  Discharged.     Final diagnoses:  Viral illness    ED Discharge Orders     None          Lang Norleen POUR, PA-C 12/24/24 1139  "

## 2024-12-25 ENCOUNTER — Other Ambulatory Visit: Payer: Self-pay

## 2024-12-25 ENCOUNTER — Telehealth (INDEPENDENT_AMBULATORY_CARE_PROVIDER_SITE_OTHER): Payer: Self-pay

## 2024-12-25 ENCOUNTER — Emergency Department (HOSPITAL_COMMUNITY)
Admission: EM | Admit: 2024-12-25 | Discharge: 2024-12-25 | Disposition: A | Discharge status: Home / Self Care | Payer: MEDICAID | Attending: Emergency Medicine | Admitting: Emergency Medicine

## 2024-12-25 DIAGNOSIS — R6884 Jaw pain: Secondary | ICD-10-CM | POA: Insufficient documentation

## 2024-12-25 DIAGNOSIS — Z5321 Procedure and treatment not carried out due to patient leaving prior to being seen by health care provider: Secondary | ICD-10-CM | POA: Diagnosis not present

## 2024-12-25 DIAGNOSIS — Z792 Long term (current) use of antibiotics: Secondary | ICD-10-CM | POA: Insufficient documentation

## 2024-12-25 NOTE — ED Notes (Signed)
 Called Pt no answer.

## 2024-12-25 NOTE — ED Triage Notes (Signed)
 Pt states sent here for an antibiotics.

## 2024-12-25 NOTE — Telephone Encounter (Signed)
 Patient called in by his mobile #. He stated that Dr. Anice wanted him to call when he got to the ED. I informed patient that the ED provider needs to call our office to speak with Dr. Anice when they get patient back.Pt needs IV antibiotics. Pt understood to call when he sees provider or get providewr to call us .

## 2024-12-25 NOTE — Telephone Encounter (Signed)
 Patient called and left a voicemail at 1:28 PM. Asking for Dr. Anice to give him a call back.

## 2024-12-25 NOTE — Telephone Encounter (Signed)
 I called and left message on patients phone after I saw meesage that he is about to leave ED at [facility]. I informed patient in message that Dr. Anice and I called [facility] before we left for lunch and informed them, the charge nurse to follow instruction in Dr. Willeen telephone note from yesterday in patients chart.

## 2024-12-25 NOTE — Telephone Encounter (Signed)
 Patient called 12/25/2024 at 10:59 am and he had spoke with Dr. Anice on yesterday about a antibiotic. He is now at [facility] and wanted to let Dr. Anice know he is there in the waiting room; he will have the provider give Dr. Anice a call once he is called back.

## 2024-12-25 NOTE — Telephone Encounter (Signed)
 I spoke with patient when call was transferred. Patient stated that he has been at the ED since 10:30 and has not been called back yet. He wants to go home and then go back to ED tomorrow. I informed patient that he needs yto talk to ED there. He stated he wants to go home and lock his doors if he is going to be admitted. I told him, Anice does want hium to have an IV line of antibiotics. If he leaves, he will still have to pay for ED visit today and tomorrow when he goes back. Patient stated he has an antibiotic pill at home he will take. I informed I can not advise him to leave since he needs the IV. He needs to speak with ED to see how much longer he has and or if he has time to go home and come back. I prefer he stay to get seen and we have spoke with the ED there. Patient stated he will address with ED there.

## 2024-12-25 NOTE — Telephone Encounter (Signed)
 Patient called stating that he has not be seen yet he is still in the waiting room. Patient stated he was about to leave the hospital because he did not want to keep waiting. Patient asked for Dr.Knight to give him a call ASAP.

## 2024-12-26 ENCOUNTER — Observation Stay (HOSPITAL_COMMUNITY)
Admission: EM | Admit: 2024-12-26 | Discharge: 2024-12-26 | Disposition: A | Discharge status: Home / Self Care | Payer: MEDICAID | Attending: Emergency Medicine | Admitting: Emergency Medicine

## 2024-12-26 ENCOUNTER — Other Ambulatory Visit: Payer: Self-pay

## 2024-12-26 ENCOUNTER — Telehealth (INDEPENDENT_AMBULATORY_CARE_PROVIDER_SITE_OTHER): Payer: Self-pay

## 2024-12-26 ENCOUNTER — Emergency Department (HOSPITAL_COMMUNITY): Payer: MEDICAID

## 2024-12-26 DIAGNOSIS — R6884 Jaw pain: Principal | ICD-10-CM | POA: Insufficient documentation

## 2024-12-26 DIAGNOSIS — I10 Essential (primary) hypertension: Secondary | ICD-10-CM | POA: Diagnosis not present

## 2024-12-26 DIAGNOSIS — K0889 Other specified disorders of teeth and supporting structures: Secondary | ICD-10-CM | POA: Diagnosis not present

## 2024-12-26 DIAGNOSIS — Z7984 Long term (current) use of oral hypoglycemic drugs: Secondary | ICD-10-CM | POA: Insufficient documentation

## 2024-12-26 DIAGNOSIS — E119 Type 2 diabetes mellitus without complications: Secondary | ICD-10-CM | POA: Insufficient documentation

## 2024-12-26 DIAGNOSIS — F17210 Nicotine dependence, cigarettes, uncomplicated: Secondary | ICD-10-CM | POA: Diagnosis not present

## 2024-12-26 DIAGNOSIS — F17200 Nicotine dependence, unspecified, uncomplicated: Secondary | ICD-10-CM | POA: Diagnosis present

## 2024-12-26 DIAGNOSIS — M272 Inflammatory conditions of jaws: Secondary | ICD-10-CM | POA: Diagnosis present

## 2024-12-26 DIAGNOSIS — S02670A Fracture of alveolus of mandible, unspecified side, initial encounter for closed fracture: Secondary | ICD-10-CM | POA: Diagnosis present

## 2024-12-26 DIAGNOSIS — Z79899 Other long term (current) drug therapy: Secondary | ICD-10-CM | POA: Diagnosis not present

## 2024-12-26 DIAGNOSIS — M869 Osteomyelitis, unspecified: Secondary | ICD-10-CM | POA: Diagnosis present

## 2024-12-26 DIAGNOSIS — J45909 Unspecified asthma, uncomplicated: Secondary | ICD-10-CM | POA: Insufficient documentation

## 2024-12-26 LAB — CBC WITH DIFFERENTIAL/PLATELET
Abs Immature Granulocytes: 0.01 10*3/uL (ref 0.00–0.07)
Basophils Absolute: 0 10*3/uL (ref 0.0–0.1)
Basophils Relative: 0 %
Eosinophils Absolute: 0.2 10*3/uL (ref 0.0–0.5)
Eosinophils Relative: 4 %
HCT: 38 % — ABNORMAL LOW (ref 39.0–52.0)
Hemoglobin: 12.7 g/dL — ABNORMAL LOW (ref 13.0–17.0)
Immature Granulocytes: 0 %
Lymphocytes Relative: 32 %
Lymphs Abs: 1.6 10*3/uL (ref 0.7–4.0)
MCH: 32.9 pg (ref 26.0–34.0)
MCHC: 33.4 g/dL (ref 30.0–36.0)
MCV: 98.4 fL (ref 80.0–100.0)
Monocytes Absolute: 0.4 10*3/uL (ref 0.1–1.0)
Monocytes Relative: 8 %
Neutro Abs: 2.7 10*3/uL (ref 1.7–7.7)
Neutrophils Relative %: 56 %
Platelets: 240 10*3/uL (ref 150–400)
RBC: 3.86 MIL/uL — ABNORMAL LOW (ref 4.22–5.81)
RDW: 15.9 % — ABNORMAL HIGH (ref 11.5–15.5)
WBC: 4.9 10*3/uL (ref 4.0–10.5)
nRBC: 0 % (ref 0.0–0.2)

## 2024-12-26 LAB — BASIC METABOLIC PANEL WITH GFR
Anion gap: 16 — ABNORMAL HIGH (ref 5–15)
BUN: 7 mg/dL (ref 6–20)
CO2: 23 mmol/L (ref 22–32)
Calcium: 9.2 mg/dL (ref 8.9–10.3)
Chloride: 101 mmol/L (ref 98–111)
Creatinine, Ser: 0.84 mg/dL (ref 0.61–1.24)
GFR, Estimated: >60 mL/min
Glucose, Bld: 178 mg/dL — ABNORMAL HIGH (ref 70–99)
Potassium: 3.5 mmol/L (ref 3.5–5.1)
Sodium: 140 mmol/L (ref 135–145)

## 2024-12-26 LAB — C-REACTIVE PROTEIN: CRP: 0.7 mg/dL

## 2024-12-26 LAB — SEDIMENTATION RATE: Sed Rate: 23 mm/h — ABNORMAL HIGH (ref 0–20)

## 2024-12-26 MED ORDER — DIPHENHYDRAMINE HCL 50 MG/ML IJ SOLN
25.0000 mg | Freq: Once | INTRAMUSCULAR | Status: DC | PRN
  Filled 2024-12-26: qty 1

## 2024-12-26 MED ORDER — AMOXICILLIN-POT CLAVULANATE 875-125 MG PO TABS: 1.0000 | ORAL_TABLET | Freq: Two times a day (BID) | ORAL | 0 refills | Status: AC

## 2024-12-26 MED ORDER — EPINEPHRINE 0.3 MG/0.3ML IJ SOAJ
0.3000 mg | Freq: Once | INTRAMUSCULAR | Status: DC | PRN
  Filled 2024-12-26: qty 0.3

## 2024-12-26 MED ORDER — OXYCODONE-ACETAMINOPHEN 5-325 MG PO TABS
1.0000 | ORAL_TABLET | Freq: Once | ORAL | Status: AC
  Administered 2024-12-26: 1 via ORAL
  Filled 2024-12-26: qty 1

## 2024-12-26 MED ORDER — OXYCODONE-ACETAMINOPHEN 5-325 MG PO TABS: 1.0000 | ORAL_TABLET | Freq: Four times a day (QID) | ORAL | 0 refills | Status: AC | PRN

## 2024-12-26 MED ORDER — IOHEXOL 300 MG/ML  SOLN
75.0000 mL | Freq: Once | INTRAMUSCULAR | Status: AC | PRN
  Administered 2024-12-26: 75 mL via INTRAVENOUS

## 2024-12-26 MED ORDER — AMOXICILLIN 250 MG PO CAPS
500.0000 mg | ORAL_CAPSULE | Freq: Once | ORAL | Status: AC
  Administered 2024-12-26: 500 mg via ORAL
  Filled 2024-12-26: qty 2

## 2024-12-26 NOTE — ED Notes (Signed)
 Pt has a visible infection noted on inside of mouth
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
## 2024-12-26 NOTE — Telephone Encounter (Signed)
 I requested ID consult and spoke to Dr. Fleeta Rothman. Updated him on the patients history. Appreciate antibiotic recommendations as the patient has a history of PCN allergy. The patients repeat imaging demonstrated decreased soft tissue edema. In addition to WBC, CRP and PLT WNL. Sed rate mildly elevated 23. Dr. Fleeta Rothman was discussing PCN challenge with the pharmacy.   The patient also called my office and asked if he could leave [facility]. I emphasized that he should stay and wait for medical recommendations from ID. I also updated him on his follow up appt on Tuesday 3pm. Address and time shared with pt and Dr. Charlyn.

## 2024-12-26 NOTE — ED Provider Notes (Signed)
 Signout from Dr. Charlyn.  54 year old male with jaw surgery in November sent in by ENT for evaluation of possible osteomyelitis.  Repeat CAT scan equivocal.  ID recommendations are for oral antibiotics with amoxicillin , getting an oral challenge as has an allergy.  If no significant symptoms after an hour patient can be discharged on oral antibiotics. Physical Exam  BP (!) 140/82   Pulse 84   Temp 98.6 F (37 C) (Oral)   Resp 20   Ht 6' 3 (1.905 m)   Wt 127 kg   SpO2 96%   BMI 35.00 kg/m   Physical Exam  Procedures  Procedures  ED Course / MDM   Clinical Course as of 12/26/24 1631  Wed Dec 26, 2024  1018 CT Maxillofacial W Contrast CT scan reviewed.  Results are equivocal.  We will touch base with the ENT doctor. [AN]  1406 White count is normal [AN]  1406 C-reactive protein CRP is normal, sed rate is slightly elevated only. [AN]  1406 Case discussed with Dr. Anice, and I went over patient's CT findings which are equivocal.  He states that patient will need to follow-up with [facility].  He has acquired [facility] follow-up for next Tuesday.  He indicated that he had spoken with ID, Dr. Fleeta Rothman, and the thought was that patient likely does not need admission to the hospital, that he could be put on oral medications.  I had ordered MRI, but also spoke spoke with Dr. Shoshana, radiologist.  He did not think MRI would add any significant value. [AN]  1605 Dr. Fleeta Rothman, ID has recommended that we give patient 5 mg amoxicillin  followed by every 15 vital signs to make sure patient is having no severe reaction.  Antibiotic recommendation after this trial.  His care has been signed out to incoming team. [AN]    Clinical Course User Index [AN] Charlyn Sora, MD   Medical Decision Making Amount and/or Complexity of Data Reviewed Labs: ordered. Decision-making details documented in ED Course. Radiology: ordered. Decision-making details documented in ED Course.  Risk Prescription  drug management.   Patient was observed and had no reaction to the trial of Augmentin .  Per ID will discharge on Augmentin .       Towana Ozell BROCKS, MD 12/27/24 1003

## 2024-12-26 NOTE — ED Triage Notes (Signed)
 Pt arrived via POV. Pt states that his primary physician had him come up today due to having a potential tooth infection. He had jaw surgery over a month and a half ago due to having a broken jaw. MD thinks that he could have an infection in his mouth from the surgery per pt. Upon assessment, pt has what to appears to be an infection in his lower left mouth.

## 2024-12-26 NOTE — Consult Note (Addendum)
 ENT eConsult   Consulting Specialist: Penne Croak, DO Specialty: Otolaryngology-Head & Neck Surgery Requesting Provider: Dr. Pearlean  Date of Review: 12/26/24 Time Spent: >30 minutes including conversation over the phone with ED staff, ID, and patient.   Reason for Consultation Hx of poorly healing mandible fracture, s/p ORIF and MMF for mandible fractures of left body and right angle mandible.  Recommended CT with thin cuts Recommended ID consult, appreciate antibiotic recs given patients PCN allergy Recommend baseline labs  Objective - CT with little change from previous, possible improvement in soft tissue swelling  Physical exam: no purulence per ED staff  Labs reviewed and reassuring - WBC, CRP, platelets WNL. ESR 23.   Plan - I discussed the case with OMFS Glendia Primrose yesterday. He suspected that the lower left 2nd premolar would need extraction. He advised referral to tertiary care center given complexity of his case. - Appreciate ID recommendations for antibiotics - NON chew diet - Strict glucose control - Tobacco cessation - Follow up at [facility] Otolaryngology office at 3pm on Tuesday 2/3, [address] [place].   CT Maxillofacial W Contrast EXAM: CT Facial Bones with contrast 12/26/2024 09:05:32 AM  TECHNIQUE: CT of the facial bones was performed with the administration of 75 mL of iohexol  (OMNIPAQUE ) 300 MG/ML solution. Multiplanar reformatted images are provided for review. Automated exposure control, iterative reconstruction, and/or weight based adjustment of the mA/kV was utilized to reduce the radiation dose to as low as reasonably achievable.  COMPARISON: Maxillofacial CT dated 12/17/2024.  CLINICAL HISTORY: Jaw surgery hx with concerns for infection, 1 mm cuts please. History of jaw surgery with concerns for infection. 1 mm cuts requested.  FINDINGS:  AERODIGESTIVE TRACT: No mass. No edema.  SALIVARY GLANDS: No acute  abnormality.  LYMPH NODES: There are few shotty cervical lymph nodes present bilaterally.  SOFT TISSUES: Persistent soft tissue thickening along the buccal surface of the mandible. The subcutaneous soft tissue stranding noted on the previous study has improved in the interim. No drainable fluid collection present.  BRAIN, ORBITS AND SINUSES: Frothy secretions again noted within the maxillary sinuses bilaterally. Mild ethmoid air cell disease also present. The mastoid air cells are clear.  BONES: Chronic poorly healing fractures of the mandibular bodies again demonstrated bilaterally, which have not changed appreciably in appearance in the interim. Orthopedic fixation screws again demonstrated transfixing the right mandibular body fracture. The left mandibular fracture is again noted along the root of the left second premolar. No suspicious bone lesion.  IMPRESSION: 1. Chronic poorly healing fractures of the mandibular bodies bilaterally, with orthopedic fixation screws transfixing the right mandibular body fracture and the left mandibular fracture along the root of the left second premolar, stable compared to the prior study; differential considerations include chronic nonunion/delayed union and chronic osteomyelitis (particularly if persistent pain, drainage, or elevated inflammatory markers). Recommend oral and maxillofacial surgery follow-up; if clinical concern for osteomyelitis or hardware infection persists, consider contrast-enhanced MRI (if feasible) or nuclear medicine infection evaluation, and short-interval imaging follow-up as clinically indicated. 2. Persistent soft tissue thickening along the buccal surface of the mandible, with improved subcutaneous soft tissue stranding compared to the prior study. No drainable fluid collection. Findings favor improving postoperative/inflammatory change; cellulitis/phlegmon remains a consideration in the appropriate clinical  setting. Recommend clinical correlation and repeat imaging if worsening symptoms or new fluctuance. 3. Frothy secretions within the maxillary sinuses bilaterally and mild ethmoid air cell disease, which can be seen with acute sinusitis. Recommend clinical correlation and  routine outpatient follow-up with primary care/ENT if symptoms persist or recur.  Electronically signed by: Evalene Coho MD 12/26/2024 09:59 AM EST RP Workstation: [id number]    Penne Croak 12/26/2024 4:48 PM Available on Haiku chat and Perfect[employer]  Department of Otolaryngology Contact Info: Otolaryngology Front Desk Phone including questions about appointments or questions: [phone] - If after normal business hours (Monday-Friday after 5PM or Weekends/Holidays), please call same number and follow prompts for Patient Access Line. There is a physician on call for urgent matters. For life threatening emergencies, please call 911

## 2024-12-26 NOTE — Discharge Instructions (Signed)
 Please keep your scheduled with Dr. Anice as planned. Please follow-up with [facility] ear nose and throat next week.  You might want to call them and confirm your appointment. Take the medications that are prescribed from the emergency room.

## 2024-12-26 NOTE — Telephone Encounter (Signed)
 I spoke with [name] and asked they push scans into pacs for [facility]. They said they will get it done. Patient just finished scan also.

## 2024-12-26 NOTE — ED Provider Notes (Signed)
 " [name] Provider Note   CSN: [id number] Arrival date & time: 12/26/24  9345     Patient presents with: Dental Pain   [place] ORN Aldrin Engelhard. is a 54 y.o. male.   HPI     54 year old male comes in with chief complaint of abnormal CT scan.  Patient has previous history of mandible surgery in November.  He reports that he was seen by his ENT doctor recently and they advised that he come to the emergency room for additional imaging and IV antibiotics.  Patient states that his doctor is worried about infection.  He denies any nausea, vomiting, fevers, chills.  Prior to Admission medications  Medication Sig Start Date End Date Taking? Authorizing Provider  allopurinol  (ZYLOPRIM ) 300 MG tablet Take 300 mg by mouth every morning. 01/24/24  Yes [provider]  amLODipine  (NORVASC ) 10 MG tablet Take 10 mg by mouth every morning.   Yes [provider]  chlorhexidine  (PERIDEX ) 0.12 % solution Use as directed 15 mLs in the mouth or throat in the morning, at noon, and at bedtime. 12/09/24  Yes Anice Riis, DO  JARDIANCE 10 MG TABS tablet Take 10 mg by mouth daily. 09/19/24  Yes [provider]  lisinopril  (ZESTRIL ) 20 MG tablet Take 20 mg by mouth every morning.   Yes [provider]  nicotine  (NICODERM CQ  - DOSED IN MG/24 HR) 7 mg/24hr patch RX #2 Weeks 7-8: 7 mg x 1 patch dailyWear for 24 hours. If you have sleep disturbances, remove at bedtime.. 10/16/24  Yes Anice Riis, DO  nicotine  polacrilex (COMMIT) 4 MG lozenge Take 1 lozenge (4 mg total) by mouth as needed for smoking cessation. 11/16/24  Yes Anice Riis, DO  omeprazole (PRILOSEC) 40 MG capsule Take 40 mg by mouth every morning.   Yes [provider]  ondansetron  (ZOFRAN -ODT) 4 MG disintegrating tablet Take 1 tablet (4 mg total) by mouth every 8 (eight) hours as needed for nausea or vomiting. 12/09/24  Yes Roselyn Carlin NOVAK, MD   polyethylene glycol powder (GLYCOLAX /MIRALAX ) 17 GM/SCOOP powder Take 17 g by mouth daily as needed for mild constipation. Dissolve 1 capful (17gm)  in 4-8 ounces of liquid  and take by mouth daily as needed for mild constipation Patient taking differently: Take 17 g by mouth daily as needed for mild constipation. 10/13/24  Yes Briana Elgin LABOR, MD  pravastatin  (PRAVACHOL ) 40 MG tablet Take 40 mg by mouth every morning.   Yes [provider]  tadalafil  (CIALIS ) 20 MG tablet Take 20 mg by mouth daily as needed for erectile dysfunction. 11/01/24  Yes [provider]    Allergies: Penicillins    Review of Systems  All other systems reviewed and are negative.   Updated Vital Signs BP (!) 159/99   Pulse 67   Temp 98.6 F (37 C) (Oral)   Resp 19   Ht 6' 3 (1.905 m)   Wt 127 kg   SpO2 94%   BMI 35.00 kg/m   Physical Exam Vitals and nursing note reviewed.  Constitutional:      Appearance: He is well-developed.  HENT:     Head: Atraumatic.     Mouth/Throat:     Comments: No trismus, generalized facial swelling noted Cardiovascular:     Rate and Rhythm: Normal rate.  Pulmonary:     Effort: Pulmonary effort is normal.  Musculoskeletal:     Cervical back: Neck supple.  Skin:  General: Skin is warm.  Neurological:     Mental Status: He is alert and oriented to person, place, and time.     (all labs ordered are listed, but only abnormal results are displayed) Labs Reviewed  BASIC METABOLIC PANEL WITH GFR - Abnormal; Notable for the following components:      Result Value   Glucose, Bld 178 (*)    Anion gap 16 (*)    All other components within normal limits  CBC WITH DIFFERENTIAL/PLATELET - Abnormal; Notable for the following components:   RBC 3.86 (*)    Hemoglobin 12.7 (*)    HCT 38.0 (*)    RDW 15.9 (*)    All other components within normal limits  SEDIMENTATION RATE - Abnormal; Notable for the following components:   Sed Rate 23 (*)    All other  components within normal limits  C-REACTIVE PROTEIN    EKG: EKG Interpretation Date/Time:  Wednesday December 26 2024 13:09:30 EST Ventricular Rate:  87 PR Interval:  157 QRS Duration:  93 QT Interval:  369 QTC Calculation: 444 R Axis:   63  Text Interpretation: Sinus rhythm No acute changes No significant change since last tracing Confirmed by Charlyn Sora [phone]) on 12/26/2024 4:03:52 PM  Radiology: CT Maxillofacial W Contrast Result Date: 12/26/2024 EXAM: CT Facial Bones with contrast 12/26/2024 09:05:32 AM TECHNIQUE: CT of the facial bones was performed with the administration of 75 mL of iohexol  (OMNIPAQUE ) 300 MG/ML solution. Multiplanar reformatted images are provided for review. Automated exposure control, iterative reconstruction, and/or weight based adjustment of the mA/kV was utilized to reduce the radiation dose to as low as reasonably achievable. COMPARISON: Maxillofacial CT dated 12/17/2024. CLINICAL HISTORY: Jaw surgery hx with concerns for infection, 1 mm cuts please. History of jaw surgery with concerns for infection. 1 mm cuts requested. FINDINGS: AERODIGESTIVE TRACT: No mass. No edema. SALIVARY GLANDS: No acute abnormality. LYMPH NODES: There are few shotty cervical lymph nodes present bilaterally. SOFT TISSUES: Persistent soft tissue thickening along the buccal surface of the mandible. The subcutaneous soft tissue stranding noted on the previous study has improved in the interim. No drainable fluid collection present. BRAIN, ORBITS AND SINUSES: Frothy secretions again noted within the maxillary sinuses bilaterally. Mild ethmoid air cell disease also present. The mastoid air cells are clear. BONES: Chronic poorly healing fractures of the mandibular bodies again demonstrated bilaterally, which have not changed appreciably in appearance in the interim. Orthopedic fixation screws again demonstrated transfixing the right mandibular body fracture. The left mandibular fracture is  again noted along the root of the left second premolar. No suspicious bone lesion. IMPRESSION: 1. Chronic poorly healing fractures of the mandibular bodies bilaterally, with orthopedic fixation screws transfixing the right mandibular body fracture and the left mandibular fracture along the root of the left second premolar, stable compared to the prior study; differential considerations include chronic nonunion/delayed union and chronic osteomyelitis (particularly if persistent pain, drainage, or elevated inflammatory markers). Recommend oral and maxillofacial surgery follow-up; if clinical concern for osteomyelitis or hardware infection persists, consider contrast-enhanced MRI (if feasible) or nuclear medicine infection evaluation, and short-interval imaging follow-up as clinically indicated. 2. Persistent soft tissue thickening along the buccal surface of the mandible, with improved subcutaneous soft tissue stranding compared to the prior study. No drainable fluid collection. Findings favor improving postoperative/inflammatory change; cellulitis/phlegmon remains a consideration in the appropriate clinical setting. Recommend clinical correlation and repeat imaging if worsening symptoms or new fluctuance. 3. Frothy secretions within the maxillary sinuses bilaterally  and mild ethmoid air cell disease, which can be seen with acute sinusitis. Recommend clinical correlation and routine outpatient follow-up with primary care/ENT if symptoms persist or recur. Electronically signed by: Evalene Coho MD 12/26/2024 09:59 AM EST RP Workstation: [id number]     Procedures   Medications Ordered in the ED  EPINEPHrine  (EPI-PEN) injection 0.3 mg (has no administration in time range)  diphenhydrAMINE  (BENADRYL ) injection 25 mg (has no administration in time range)  iohexol  (OMNIPAQUE ) 300 MG/ML solution 75 mL (75 mLs Intravenous Contrast Given 12/26/24 0848)  oxyCODONE -acetaminophen  (PERCOCET/ROXICET) 5-325 MG per tablet  1 tablet (1 tablet Oral Given 12/26/24 1526)  amoxicillin  (AMOXIL ) capsule 500 mg (500 mg Oral Given 12/26/24 1526)    Clinical Course as of 12/28/24 0809  Wed Dec 26, 2024  1018 CT Maxillofacial W Contrast CT scan reviewed.  Results are equivocal.  We will touch base with the ENT doctor. [AN]  1406 White count is normal [AN]  1406 C-reactive protein CRP is normal, sed rate is slightly elevated only. [AN]  1406 Case discussed with Dr. Anice, and I went over patient's CT findings which are equivocal.  He states that patient will need to follow-up with [facility].  He has acquired [facility] follow-up for next Tuesday.  He indicated that he had spoken with ID, Dr. Fleeta Rothman, and the thought was that patient likely does not need admission to the hospital, that he could be put on oral medications.  I had ordered MRI, but also spoke spoke with Dr. Shoshana, radiologist.  He did not think MRI would add any significant value. [AN]  1605 Dr. Fleeta Rothman, ID has recommended that we give patient 5 mg amoxicillin  followed by every 15 vital signs to make sure patient is having no severe reaction.  Antibiotic recommendation after this trial.  His care has been signed out to incoming team. [AN]    Clinical Course User Index [AN] Charlyn Sora, MD                                 Medical Decision Making Amount and/or Complexity of Data Reviewed Labs: ordered. Decision-making details documented in ED Course. Radiology: ordered. Decision-making details documented in ED Course.  Risk Prescription drug management.   54 year old male comes in with chief complaint of abnormal CT scan.  He has history of mandible surgery in November.  I have reviewed patient's records including ENT visit from few days ago and the CT max face without contrast that was done prior to that clinic visit.  ENT physician is concerned about poor healing of the mandibular fracture and worried about underlying infection.  He had  recommended patient come to the emergency room for additional CT scan with smaller cuts, and possible evaluation by ID.  We will get basic labs, sed rate and CT max face with contrast.  Final diagnoses:  Mandible pain    ED Discharge Orders     None          Charlyn Sora, MD 12/28/24 9190  "

## 2024-12-27 ENCOUNTER — Encounter (INDEPENDENT_AMBULATORY_CARE_PROVIDER_SITE_OTHER): Payer: Self-pay

## 2024-12-27 ENCOUNTER — Ambulatory Visit (INDEPENDENT_AMBULATORY_CARE_PROVIDER_SITE_OTHER): Payer: MEDICAID

## 2024-12-27 VITALS — BP 145/88 | HR 94 | Wt 279.0 lb

## 2024-12-27 DIAGNOSIS — Z91119 Patient's noncompliance with dietary regimen due to unspecified reason: Secondary | ICD-10-CM

## 2024-12-27 DIAGNOSIS — S02670G Fracture of alveolus of mandible, unspecified side, subsequent encounter for fracture with delayed healing: Secondary | ICD-10-CM

## 2024-12-27 DIAGNOSIS — S02602G Fracture of unspecified part of body of left mandible, subsequent encounter for fracture with delayed healing: Secondary | ICD-10-CM

## 2024-12-27 DIAGNOSIS — S02609A Fracture of mandible, unspecified, initial encounter for closed fracture: Secondary | ICD-10-CM

## 2024-12-27 DIAGNOSIS — F17200 Nicotine dependence, unspecified, uncomplicated: Secondary | ICD-10-CM

## 2024-12-27 DIAGNOSIS — M8448XP Pathological fracture, other site, subsequent encounter for fracture with malunion: Secondary | ICD-10-CM

## 2024-12-27 NOTE — Progress Notes (Signed)
 Discussed the use of AI scribe software for clinical note transcription with the patient, who gave verbal consent to proceed.  History of Present Illness Ethan [name]. is a 54 year old male with delayed healing of left mandibular body and comminuted right mandibular fractures, status post open reduction and internal fixation, who presents with jaw pain and persistent numbness. Pt with increased bilateral mandible tenderness to palpation at last visit. He was advised to obtain a CT which showed poor healing bilaterally.   Mandibular Swelling and Numbness: - Persistent swelling of the jaw, most pronounced on the left side - Swelling now improving - Numbness persists in the left mandibular region and anterior jaw, also improving  Malocclusion and Functional Impairment: - Ongoing malocclusion with misalignment of teeth - Inability to use teeth for previous tasks - Avoids chewing on the left side.  - Primarily consumes soft foods, on the right  Tobacco use - has decreased to 3 cigarettes a day Glucose in the 140s fasting  Medication Use: - Currently taking antibiotics every twelve hours - Uses pain medication as prescribed - Documented allergy to amoxicillin     Physical Exam Non ill appearing.  Currently the patients left mandible appears normal on the lingual side. The exposed mandible has improved coverage with only a persistent linear open fracture on the buccal aspect of the left mandible. There is no edema, erythema or purulence. There is no mandibular edema, but there is tenderness to palpation over the left mandibular body.   Assessment & Plan 60M Postoperative period has been affected by not adhering to a nonchew diet, tobacco use and diabetes. In addition the patient had a reported penicillin allergy, he passed amoxicillin  challenge in the ED yesterday and has been taking augmentin , without any issues.   10/09/2024 S/p MMF and ORIF of left open body mandible fracture and  right comminuted ankle fracture  11/06/2024 - panorex - bilateral mandible fractures demonstrating near-anatomic alignment 11/16/2024 S/p removal of hardware and Integra placement over exposed alveolus, 11/16/2024. MMF and Left ORIF plate was removed given exposed hardware.   Post op imaging - panorex - alignment unchanged and suggestive of early healing.  12/07/24- Grossly normal physical exam, but reported tenderness over mandible sites. CT Ordered.  1/23- CT completed showing poorly healing mandible fractures L worse than R. I consulted OMFS, Ethan [name], over the phone. He suspected that the tooth should be removed and recommend referral to a tertiary care center for further care consideration of surgical intervention and possible bone grafting. Pt was advised to go to the ED for an ID consultation and repeat imaging with thin cuts in the event he needs a custom plate on 8/73. This was completed on 1/28. Labs were reassuring - WBC, CRP and platelets WNL. ESR 23. He was evaluated by ID who performed an amoxicillin  challenge and discharged on PO augmentin .   I called Ethan [name] to discuss this complicated patient. He was added on their clinic urgently for 01/01/25 at 3pm.   Currently the patient has decreased pain and decreased edema. His left mandible appears normal on the lingual aspect. The bone exposed on the left buccal aspect has improved coverage with only a persistent linear open fracture. There is minimal edema, and no erythema or purulence. There is tenderness to palpation over the left mandibular body.   Delayed healing of left mandibular body and comminuted right mandibular fractures.   - Referred to [facility] [name] for evaluation and surgical management, including possible tooth extraction  and larger plate placement. Appointment Tuesday at 3:00 PM. - Continue prescribed antibiotics. - Maintain non-chew diet, avoid mastication on affected side. - Discussed likelihood of surgery, potential  transcervical incision, and beard shaving. - Emphasized glycemic control and smoking cessation to optimize outcomes. Noted reduction in cigarette use and use of nicotine  gum/patches. - Offered ongoing follow-up, reassessment 6-12 months post-tertiary intervention.  I spent 45 minutes (exclusive of separately billable procedures) on the day of the encounter reviewing the patient's chart, seeing the patient face to face, discussing the findings and the treatment plan, and documenting in the EHR.

## 2024-12-31 NOTE — Telephone Encounter (Signed)
 Addendum: Patient located at his home. Provider located at his own home.

## 2025-01-01 ENCOUNTER — Telehealth (INDEPENDENT_AMBULATORY_CARE_PROVIDER_SITE_OTHER): Payer: Self-pay

## 2025-01-02 ENCOUNTER — Other Ambulatory Visit (INDEPENDENT_AMBULATORY_CARE_PROVIDER_SITE_OTHER): Payer: Self-pay

## 2025-01-02 ENCOUNTER — Telehealth (INDEPENDENT_AMBULATORY_CARE_PROVIDER_SITE_OTHER): Payer: Self-pay

## 2025-01-02 DIAGNOSIS — S02602B Fracture of unspecified part of body of left mandible, initial encounter for open fracture: Secondary | ICD-10-CM

## 2025-01-02 DIAGNOSIS — M8448XP Pathological fracture, other site, subsequent encounter for fracture with malunion: Secondary | ICD-10-CM

## 2025-01-02 NOTE — Telephone Encounter (Signed)
 Dr. Tanda at [facility] evaluated the patient and recommend referral to [facility] where they have OMFS and ENT. Will place referral.

## 2025-01-03 ENCOUNTER — Telehealth (INDEPENDENT_AMBULATORY_CARE_PROVIDER_SITE_OTHER): Payer: Self-pay

## 2025-01-03 ENCOUNTER — Encounter (INDEPENDENT_AMBULATORY_CARE_PROVIDER_SITE_OTHER): Payer: Self-pay

## 2025-01-03 NOTE — Telephone Encounter (Signed)
 Patient called and left voicemail stating that he was referred back to Dr.Knight and stated that the doctor said that Dr. Anice will have to refer him to ENT in [place]. Patient requested a call back ASAP. Please advise

## 2025-01-03 NOTE — Telephone Encounter (Signed)
 I left message for patient that I recieved his message about the provider we referred him to, suggested he come back to see Dr. Anice. I was calling to get ant information needed. I left office # for him to call me back.

## 2025-01-04 ENCOUNTER — Telehealth (INDEPENDENT_AMBULATORY_CARE_PROVIDER_SITE_OTHER): Payer: Self-pay

## 2025-01-04 ENCOUNTER — Encounter (INDEPENDENT_AMBULATORY_CARE_PROVIDER_SITE_OTHER): Payer: Self-pay

## 2025-01-04 NOTE — Telephone Encounter (Signed)
 I sent an OMFS order to [facility] per [facility] recommendation.

## 2025-01-04 NOTE — Telephone Encounter (Signed)
 I spoke with patient to inform him of the phone # for Dr. Zachary Kohler at [phone]. He needs to give them til mid next week to call him after they get the referral in. If he has not heard from them, he can call them. If any problems, call me back.

## 2025-01-04 NOTE — Telephone Encounter (Signed)
 I called the office of Dr. Zachary Kohler and had to leave a message for them to call me back. I was following up to make sure they received referral that was sent out yesterday. I called [phone]. They did call back and confirmed that it is the office of Dr. Zachary Kohler. However we did not have the correct fax #. They gave me the fax # of [phone]. I informed our referral team of the fax #.

## 2025-01-04 NOTE — Telephone Encounter (Signed)
 Which provider said to see me? [facility] or [facility]?

## 2025-01-04 NOTE — Telephone Encounter (Signed)
 I spoke with patient when he called into office today at 9:38am. He stated that  the provider he saw this week in [place] suggested that he be seen at [facility] or [facility] to better care for him and his needs of his jaw. He stated that they pulled out another piece of a tooth out of his gum form his injury. I informed him that DR. Anice is out of office until next Tues. We will address this and one of us  will give him a call back next week after Anice is back in office. Patient understood.

## 2025-02-04 ENCOUNTER — Ambulatory Visit (INDEPENDENT_AMBULATORY_CARE_PROVIDER_SITE_OTHER): Payer: MEDICAID

## 2025-02-26 ENCOUNTER — Ambulatory Visit: Payer: Self-pay

## 2025-03-04 ENCOUNTER — Encounter: Payer: MEDICAID | Admitting: Nutrition

## 2025-06-06 ENCOUNTER — Ambulatory Visit: Payer: MEDICAID | Admitting: Physician Assistant
